# Patient Record
Sex: Male | Born: 1970 | Race: White | Hispanic: No | Marital: Married | Smoking: Never smoker
Health system: Southern US, Community
[De-identification: ages and names within clinical notes are randomized; demographics above are authoritative.]

## PROBLEM LIST (undated history)

## (undated) DIAGNOSIS — T8859XA Other complications of anesthesia, initial encounter: Secondary | ICD-10-CM

## (undated) DIAGNOSIS — G4730 Sleep apnea, unspecified: Secondary | ICD-10-CM

## (undated) DIAGNOSIS — T4145XA Adverse effect of unspecified anesthetic, initial encounter: Secondary | ICD-10-CM

## (undated) DIAGNOSIS — J189 Pneumonia, unspecified organism: Secondary | ICD-10-CM

## (undated) DIAGNOSIS — F909 Attention-deficit hyperactivity disorder, unspecified type: Secondary | ICD-10-CM

## (undated) DIAGNOSIS — E785 Hyperlipidemia, unspecified: Secondary | ICD-10-CM

## (undated) DIAGNOSIS — I10 Essential (primary) hypertension: Secondary | ICD-10-CM

## (undated) DIAGNOSIS — T7840XA Allergy, unspecified, initial encounter: Secondary | ICD-10-CM

## (undated) DIAGNOSIS — J45909 Unspecified asthma, uncomplicated: Secondary | ICD-10-CM

## (undated) DIAGNOSIS — M8700 Idiopathic aseptic necrosis of unspecified bone: Secondary | ICD-10-CM

## (undated) DIAGNOSIS — R7303 Prediabetes: Secondary | ICD-10-CM

## (undated) HISTORY — PX: GANGLION CYST EXCISION: SHX1691

## (undated) HISTORY — PX: TONGUE FLAP RELEASE: SHX2537

---

## 2007-03-07 ENCOUNTER — Emergency Department: Payer: Self-pay | Admitting: Emergency Medicine

## 2008-01-10 ENCOUNTER — Emergency Department: Payer: Self-pay | Admitting: Unknown Physician Specialty

## 2008-01-10 ENCOUNTER — Other Ambulatory Visit: Payer: Self-pay

## 2011-01-30 ENCOUNTER — Ambulatory Visit: Payer: Self-pay | Admitting: Orthopedic Surgery

## 2011-03-10 ENCOUNTER — Ambulatory Visit: Payer: Self-pay | Admitting: Pain Medicine

## 2011-03-21 ENCOUNTER — Ambulatory Visit: Payer: Self-pay | Admitting: Pain Medicine

## 2011-03-29 ENCOUNTER — Ambulatory Visit: Payer: Self-pay | Admitting: Pain Medicine

## 2011-05-03 ENCOUNTER — Ambulatory Visit: Payer: Self-pay | Admitting: Pain Medicine

## 2011-05-24 ENCOUNTER — Ambulatory Visit: Payer: Self-pay | Admitting: Pain Medicine

## 2011-05-26 ENCOUNTER — Ambulatory Visit: Payer: Self-pay | Admitting: Pain Medicine

## 2011-06-13 ENCOUNTER — Ambulatory Visit: Payer: Self-pay | Admitting: Pain Medicine

## 2011-06-16 ENCOUNTER — Ambulatory Visit: Payer: Self-pay | Admitting: Pain Medicine

## 2011-06-29 ENCOUNTER — Ambulatory Visit: Payer: Self-pay | Admitting: General Practice

## 2011-07-31 ENCOUNTER — Ambulatory Visit: Payer: Self-pay | Admitting: Orthopedic Surgery

## 2011-09-27 ENCOUNTER — Ambulatory Visit: Payer: Self-pay | Admitting: Orthopedic Surgery

## 2011-10-05 ENCOUNTER — Other Ambulatory Visit: Payer: Self-pay | Admitting: Orthopedic Surgery

## 2011-10-05 ENCOUNTER — Other Ambulatory Visit: Payer: Self-pay

## 2011-10-05 ENCOUNTER — Encounter (HOSPITAL_COMMUNITY): Payer: Self-pay

## 2011-10-05 ENCOUNTER — Encounter (HOSPITAL_COMMUNITY)
Admission: RE | Admit: 2011-10-05 | Discharge: 2011-10-05 | Disposition: A | Discharge status: Home / Self Care | Payer: Worker's Compensation | Source: Ambulatory Visit | Attending: Orthopedic Surgery | Admitting: Orthopedic Surgery

## 2011-10-05 HISTORY — DX: Pneumonia, unspecified organism: J18.9

## 2011-10-05 LAB — PROTIME-INR: INR: 0.95 (ref 0.00–1.49)

## 2011-10-05 LAB — URINALYSIS, ROUTINE W REFLEX MICROSCOPIC
Glucose, UA: NEGATIVE mg/dL
Ketones, ur: NEGATIVE mg/dL
Leukocytes, UA: NEGATIVE
pH: 6 (ref 5.0–8.0)

## 2011-10-05 LAB — CBC
Hemoglobin: 15.1 g/dL (ref 13.0–17.0)
MCH: 28.9 pg (ref 26.0–34.0)
MCHC: 33.5 g/dL (ref 30.0–36.0)
RDW: 13.5 % (ref 11.5–15.5)

## 2011-10-05 LAB — BASIC METABOLIC PANEL
BUN: 14 mg/dL (ref 6–23)
Calcium: 9.3 mg/dL (ref 8.4–10.5)
Creatinine, Ser: 0.88 mg/dL (ref 0.50–1.35)
GFR calc non Af Amer: >90 mL/min (ref >90)
Glucose, Bld: 130 mg/dL — ABNORMAL HIGH (ref 70–99)

## 2011-10-05 LAB — DIFFERENTIAL
Basophils Absolute: 0 10*3/uL (ref 0.0–0.1)
Basophils Relative: 0 % (ref 0–1)
Eosinophils Absolute: 0.2 10*3/uL (ref 0.0–0.7)
Monocytes Relative: 6 % (ref 3–12)
Neutrophils Relative %: 70 % (ref 43–77)

## 2011-10-05 LAB — ABO/RH: ABO/RH(D): A POS

## 2011-10-05 LAB — SURGICAL PCR SCREEN: MRSA, PCR: NEGATIVE

## 2011-10-05 NOTE — Pre-Procedure Instructions (Signed)
18 [name]  10/05/2011   Your procedure is scheduled on:  Friday October 07, 2011  Report to [facility] at 1:00pm AM.  Call this number if you have problems the morning of surgery: [phone]   Remember:   Do not eat food:After Midnight.  May have clear liquids: up to 4 Hours before arrival. (up to 9:00am)  Clear liquids include soda, tea, black coffee, apple or grape juice, broth.  Take these medicines the morning of surgery with A SIP OF WATER:    Do not wear jewelry, make-up or nail polish.  Do not wear lotions, powders, or perfumes. You may wear deodorant.  Do not shave 48 hours prior to surgery.  Do not bring valuables to the hospital.  Contacts, dentures or bridgework may not be worn into surgery.  Leave suitcase in the car. After surgery it may be brought to your room.  For patients admitted to the hospital, checkout time is 11:00 AM the day of discharge.   Patients discharged the day of surgery will not be allowed to drive home.  Name and phone number of your driver: family/friend  Special Instructions: Incentive Spirometry - Practice and bring it with you on the day of surgery. and CHG Shower Use Special Wash: 1/2 bottle night before surgery and 1/2 bottle morning of surgery.   Please read over the following fact sheets that you were given: Pain Booklet, Coughing and Deep Breathing, Blood Transfusion Information, Total Joint Packet, MRSA Information and Surgical Site Infection Prevention

## 2011-10-05 NOTE — Progress Notes (Signed)
Patient request to be put in privacy status, notified Kim in admission.

## 2011-10-06 MED ORDER — CEFAZOLIN SODIUM-DEXTROSE 2-3 GM-% IV SOLR
2.0000 g | INTRAVENOUS | Status: AC
  Administered 2011-10-07: 2 g via INTRAVENOUS
  Filled 2011-10-06: qty 50

## 2011-10-06 MED ORDER — CHLORHEXIDINE GLUCONATE 4 % EX LIQD: 60.0000 mL | Freq: Once | CUTANEOUS | Status: DC

## 2011-10-06 MED ORDER — CEFAZOLIN SODIUM 1-5 GM-% IV SOLN: 1.0000 g | INTRAVENOUS | Status: DC

## 2011-10-06 NOTE — H&P (Signed)
  HISTORY  OF  PRESENT  ILLNESS:   [name]  is  a  pleasant  41 year old  male  who  states  he  was injured at work on 01/18/2011.  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  He describes the pain in his right groin as a sharp, stabbing, sensation and rates the pain at approximately 8/10.    He did have a total of 1 month of physical therapy with no significant long term improvement.  He has also tried pain medicine and NSAIDs without improvement.  PAST MEDICAL HISTORY:  None. PAST SURGICAL HISTORY:  None. MEDICATIONS:  Tramadol, naproxen. ALLERGIES:  None. FAMILY HISTORY:  None.  SOCIAL  HISTORY:  The patient denies alcohol or tobacco use.  He lives alone.  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  Review  of  systems  reviewed  thoroughly  and  all  other  systems  are  negative  as  related  to  the  chief complaint. No SOB, No chest pain, no ulcer, no diabetes, no seizures.  PHYSICAL  EXAMINATION:  The patient is alert and  oriented x3  and  in  no acute distress.  He does walk with a limp and clearly does favor the right side.  Of particular note, he does have pain with internal rotation of the right hip.  He states that this does reproduce his daily pain with walking.  He also has pain with axial loading of the right hip.  He is neurovascularly intact with no strength deficits.  His reflexes are symmetric at the knees and at the ankles at approximately 2+.  Straight leg raising is negative on the right and  the  left  sides.   The skin  on  the  back  and  bilateral  legs  are  intact.   His  capillary  refill  is  less  than  2 second.   His  extraocular  motions  are  intact.   His  breathing  is  nonlabored.   There  is  no  swelling  in  his bilateral legs.  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    ASSESSMENT:  Right  hip AVN  PLAN:   Based on the findings on the MRI scan and the patient's level of pain and dysfunction, we recommend total hip arthroplasty.  The risks and benefits of surgery were discussed in detail with the patient and his rehab nurse today.  We will schedule surgery pending approval by worker's comp.

## 2011-10-07 ENCOUNTER — Encounter (HOSPITAL_COMMUNITY): Payer: Self-pay | Admitting: Anesthesiology

## 2011-10-07 ENCOUNTER — Inpatient Hospital Stay (HOSPITAL_COMMUNITY)
Admission: RE | Admit: 2011-10-07 | Discharge: 2011-10-08 | DRG: 470 | Disposition: A | Discharge status: Home / Self Care | Payer: Worker's Compensation | Source: Ambulatory Visit | Attending: Orthopedic Surgery | Admitting: Orthopedic Surgery

## 2011-10-07 ENCOUNTER — Inpatient Hospital Stay (HOSPITAL_COMMUNITY): Payer: Worker's Compensation

## 2011-10-07 ENCOUNTER — Inpatient Hospital Stay (HOSPITAL_COMMUNITY): Payer: Worker's Compensation | Admitting: Anesthesiology

## 2011-10-07 ENCOUNTER — Encounter (HOSPITAL_COMMUNITY): Payer: Self-pay | Admitting: *Deleted

## 2011-10-07 ENCOUNTER — Encounter (HOSPITAL_COMMUNITY): Payer: Self-pay | Admitting: Orthopedic Surgery

## 2011-10-07 ENCOUNTER — Encounter (HOSPITAL_COMMUNITY)
Admission: RE | Disposition: A | Discharge status: Home / Self Care | Payer: Self-pay | Source: Ambulatory Visit | Attending: Orthopedic Surgery

## 2011-10-07 DIAGNOSIS — M87059 Idiopathic aseptic necrosis of unspecified femur: Secondary | ICD-10-CM | POA: Diagnosis present

## 2011-10-07 DIAGNOSIS — Z01818 Encounter for other preprocedural examination: Secondary | ICD-10-CM

## 2011-10-07 DIAGNOSIS — Z01810 Encounter for preprocedural cardiovascular examination: Secondary | ICD-10-CM

## 2011-10-07 HISTORY — DX: Idiopathic aseptic necrosis of unspecified bone: M87.00

## 2011-10-07 HISTORY — PX: TOTAL HIP ARTHROPLASTY: SHX124

## 2011-10-07 SURGERY — ARTHROPLASTY, HIP, TOTAL,POSTERIOR APPROACH
Anesthesia: General | Site: Hip | Laterality: Right | Wound class: Clean

## 2011-10-07 MED ORDER — VECURONIUM BROMIDE 10 MG IV SOLR
INTRAVENOUS | Status: DC | PRN
  Administered 2011-10-07: 2 mg via INTRAVENOUS
  Administered 2011-10-07 (×2): 1 mg via INTRAVENOUS

## 2011-10-07 MED ORDER — PROPOFOL 10 MG/ML IV EMUL
INTRAVENOUS | Status: DC | PRN
  Administered 2011-10-07: 200 mg via INTRAVENOUS

## 2011-10-07 MED ORDER — ALUMINUM HYDROXIDE GEL 320 MG/5ML PO SUSP
15.0000 mL | ORAL | Status: DC | PRN
  Filled 2011-10-07: qty 30

## 2011-10-07 MED ORDER — METOCLOPRAMIDE HCL 10 MG PO TABS: 5.0000 mg | ORAL_TABLET | Freq: Three times a day (TID) | ORAL | Status: DC | PRN

## 2011-10-07 MED ORDER — OXYCODONE HCL 5 MG PO TABS
5.0000 mg | ORAL_TABLET | ORAL | Status: DC | PRN
  Administered 2011-10-07 – 2011-10-08 (×3): 10 mg via ORAL
  Filled 2011-10-07 (×4): qty 2

## 2011-10-07 MED ORDER — GLYCOPYRROLATE 0.2 MG/ML IJ SOLN
INTRAMUSCULAR | Status: DC | PRN
  Administered 2011-10-07: 1 mg via INTRAVENOUS

## 2011-10-07 MED ORDER — FLEET ENEMA 7-19 GM/118ML RE ENEM: 1.0000 | ENEMA | Freq: Once | RECTAL | Status: AC | PRN

## 2011-10-07 MED ORDER — ONDANSETRON HCL 4 MG PO TABS: 4.0000 mg | ORAL_TABLET | Freq: Four times a day (QID) | ORAL | Status: DC | PRN

## 2011-10-07 MED ORDER — WARFARIN VIDEO: Freq: Once | Status: DC

## 2011-10-07 MED ORDER — DEXTROSE-NACL 5-0.45 % IV SOLN: INTRAVENOUS | Status: DC

## 2011-10-07 MED ORDER — DIPHENHYDRAMINE HCL 12.5 MG/5ML PO ELIX
12.5000 mg | ORAL_SOLUTION | ORAL | Status: DC | PRN
  Filled 2011-10-07: qty 10

## 2011-10-07 MED ORDER — LACTATED RINGERS IV SOLN
INTRAVENOUS | Status: DC | PRN
  Administered 2011-10-07 (×3): via INTRAVENOUS

## 2011-10-07 MED ORDER — NEOSTIGMINE METHYLSULFATE 1 MG/ML IJ SOLN
INTRAMUSCULAR | Status: DC | PRN
  Administered 2011-10-07: 5 mg via INTRAVENOUS

## 2011-10-07 MED ORDER — ENOXAPARIN SODIUM 40 MG/0.4ML ~~LOC~~ SOLN
40.0000 mg | SUBCUTANEOUS | Status: DC
  Administered 2011-10-08: 40 mg via SUBCUTANEOUS
  Filled 2011-10-07 (×2): qty 0.4

## 2011-10-07 MED ORDER — METHOCARBAMOL 100 MG/ML IJ SOLN
500.0000 mg | Freq: Four times a day (QID) | INTRAVENOUS | Status: DC | PRN
  Administered 2011-10-07: 500 mg via INTRAVENOUS
  Filled 2011-10-07: qty 5

## 2011-10-07 MED ORDER — ROCURONIUM BROMIDE 100 MG/10ML IV SOLN
INTRAVENOUS | Status: DC | PRN
  Administered 2011-10-07: 50 mg via INTRAVENOUS

## 2011-10-07 MED ORDER — PHENOL 1.4 % MT LIQD
1.0000 | OROMUCOSAL | Status: DC | PRN
  Filled 2011-10-07: qty 177

## 2011-10-07 MED ORDER — ACETAMINOPHEN 325 MG PO TABS: 650.0000 mg | ORAL_TABLET | Freq: Four times a day (QID) | ORAL | Status: DC | PRN

## 2011-10-07 MED ORDER — FENTANYL CITRATE 0.05 MG/ML IJ SOLN
INTRAMUSCULAR | Status: DC | PRN
  Administered 2011-10-07 (×2): 50 ug via INTRAVENOUS
  Administered 2011-10-07: 150 ug via INTRAVENOUS

## 2011-10-07 MED ORDER — BISACODYL 5 MG PO TBEC: 5.0000 mg | DELAYED_RELEASE_TABLET | Freq: Every day | ORAL | Status: DC | PRN

## 2011-10-07 MED ORDER — KCL IN DEXTROSE-NACL 20-5-0.45 MEQ/L-%-% IV SOLN
INTRAVENOUS | Status: DC
  Administered 2011-10-07: 1000 mL via INTRAVENOUS
  Filled 2011-10-07 (×4): qty 1000

## 2011-10-07 MED ORDER — HYDROMORPHONE HCL PF 1 MG/ML IJ SOLN
0.2500 mg | INTRAMUSCULAR | Status: DC | PRN
  Administered 2011-10-07 (×4): 0.5 mg via INTRAVENOUS

## 2011-10-07 MED ORDER — SODIUM CHLORIDE 0.9 % IR SOLN
Status: DC | PRN
  Administered 2011-10-07: 1000 mL

## 2011-10-07 MED ORDER — HYDROMORPHONE HCL PF 1 MG/ML IJ SOLN
INTRAMUSCULAR | Status: AC
  Administered 2011-10-07: 1 mg
  Filled 2011-10-07: qty 1

## 2011-10-07 MED ORDER — CEFAZOLIN SODIUM 1-5 GM-% IV SOLN
1.0000 g | Freq: Four times a day (QID) | INTRAVENOUS | Status: AC
  Administered 2011-10-07 – 2011-10-08 (×3): 1 g via INTRAVENOUS
  Filled 2011-10-07 (×3): qty 50

## 2011-10-07 MED ORDER — HYDROMORPHONE HCL PF 1 MG/ML IJ SOLN
INTRAMUSCULAR | Status: AC
  Administered 2011-10-07: 1 mg via INTRAVENOUS
  Filled 2011-10-07: qty 1

## 2011-10-07 MED ORDER — ZOLPIDEM TARTRATE 5 MG PO TABS: 5.0000 mg | ORAL_TABLET | Freq: Every evening | ORAL | Status: DC | PRN

## 2011-10-07 MED ORDER — DROPERIDOL 2.5 MG/ML IJ SOLN
INTRAMUSCULAR | Status: AC
  Filled 2011-10-07: qty 2

## 2011-10-07 MED ORDER — COUMADIN BOOK
Freq: Once | Status: AC
  Administered 2011-10-08: 12:00:00
  Filled 2011-10-07: qty 1

## 2011-10-07 MED ORDER — METHOCARBAMOL 500 MG PO TABS
500.0000 mg | ORAL_TABLET | Freq: Four times a day (QID) | ORAL | Status: DC | PRN
  Administered 2011-10-07 – 2011-10-08 (×2): 500 mg via ORAL
  Filled 2011-10-07 (×3): qty 1

## 2011-10-07 MED ORDER — HYDROMORPHONE HCL PF 1 MG/ML IJ SOLN
0.5000 mg | INTRAMUSCULAR | Status: DC | PRN
  Administered 2011-10-07 – 2011-10-08 (×4): 1 mg via INTRAVENOUS
  Filled 2011-10-07 (×3): qty 1

## 2011-10-07 MED ORDER — HYDROCODONE-ACETAMINOPHEN 5-325 MG PO TABS
1.0000 | ORAL_TABLET | ORAL | Status: DC | PRN
  Administered 2011-10-08: 1 via ORAL
  Filled 2011-10-07: qty 1

## 2011-10-07 MED ORDER — DROPERIDOL 2.5 MG/ML IJ SOLN
0.6250 mg | INTRAMUSCULAR | Status: DC | PRN
  Administered 2011-10-07: 0.625 mg via INTRAVENOUS

## 2011-10-07 MED ORDER — ONDANSETRON HCL 4 MG/2ML IJ SOLN
INTRAMUSCULAR | Status: DC | PRN
  Administered 2011-10-07: 4 mg via INTRAVENOUS

## 2011-10-07 MED ORDER — ONDANSETRON HCL 4 MG/2ML IJ SOLN
4.0000 mg | Freq: Four times a day (QID) | INTRAMUSCULAR | Status: DC | PRN
  Administered 2011-10-07 – 2011-10-08 (×3): 4 mg via INTRAVENOUS
  Filled 2011-10-07 (×3): qty 2

## 2011-10-07 MED ORDER — MENTHOL 3 MG MT LOZG: 1.0000 | LOZENGE | OROMUCOSAL | Status: DC | PRN

## 2011-10-07 MED ORDER — SENNOSIDES-DOCUSATE SODIUM 8.6-50 MG PO TABS: 1.0000 | ORAL_TABLET | Freq: Every evening | ORAL | Status: DC | PRN

## 2011-10-07 MED ORDER — BUPIVACAINE-EPINEPHRINE 0.5% -1:200000 IJ SOLN
INTRAMUSCULAR | Status: DC | PRN
  Administered 2011-10-07: 20 mL

## 2011-10-07 MED ORDER — LIDOCAINE HCL (CARDIAC) 20 MG/ML IV SOLN
INTRAVENOUS | Status: DC | PRN
  Administered 2011-10-07: 50 mg via INTRAVENOUS

## 2011-10-07 MED ORDER — MIDAZOLAM HCL 5 MG/5ML IJ SOLN
INTRAMUSCULAR | Status: DC | PRN
  Administered 2011-10-07: 2 mg via INTRAVENOUS

## 2011-10-07 MED ORDER — ACETAMINOPHEN 650 MG RE SUPP: 650.0000 mg | Freq: Four times a day (QID) | RECTAL | Status: DC | PRN

## 2011-10-07 MED ORDER — KCL IN DEXTROSE-NACL 20-5-0.45 MEQ/L-%-% IV SOLN
INTRAVENOUS | Status: AC
  Administered 2011-10-07: 1000 mL via INTRAVENOUS
  Filled 2011-10-07: qty 1000

## 2011-10-07 MED ORDER — WARFARIN SODIUM 10 MG PO TABS
10.0000 mg | ORAL_TABLET | Freq: Once | ORAL | Status: AC
  Administered 2011-10-07: 10 mg via ORAL
  Filled 2011-10-07: qty 1

## 2011-10-07 MED ORDER — LACTATED RINGERS IV SOLN
INTRAVENOUS | Status: DC
  Administered 2011-10-07: 10:00:00 via INTRAVENOUS

## 2011-10-07 MED ORDER — EPHEDRINE SULFATE 50 MG/ML IJ SOLN
INTRAMUSCULAR | Status: DC | PRN
Start: 2011-10-07 — End: 2011-10-07
  Administered 2011-10-07: 15 mg via INTRAVENOUS
  Administered 2011-10-07: 10 mg via INTRAVENOUS

## 2011-10-07 MED ORDER — METOCLOPRAMIDE HCL 5 MG/ML IJ SOLN
5.0000 mg | Freq: Three times a day (TID) | INTRAMUSCULAR | Status: DC | PRN
  Administered 2011-10-07 – 2011-10-08 (×2): 10 mg via INTRAVENOUS
  Filled 2011-10-07 (×2): qty 2

## 2011-10-07 SURGICAL SUPPLY — 55 items
BLADE SAW SAG 73X25 THK (BLADE) ×1
BLADE SAW SGTL 18X1.27X75 (BLADE) IMPLANT
BLADE SAW SGTL 73X25 THK (BLADE) ×1 IMPLANT
BLADE SAW SGTL MED 73X18.5 STR (BLADE) IMPLANT
BRUSH FEMORAL CANAL (MISCELLANEOUS) IMPLANT
CLOTH BEACON ORANGE TIMEOUT ST (SAFETY) ×2 IMPLANT
COVER BACK TABLE 24X17X13 BIG (DRAPES) ×2 IMPLANT
COVER SURGICAL LIGHT HANDLE (MISCELLANEOUS) ×4 IMPLANT
DRAPE ORTHO SPLIT 77X108 STRL (DRAPES) ×1
DRAPE PROXIMA HALF (DRAPES) ×4 IMPLANT
DRAPE SURG ORHT 6 SPLT 77X108 (DRAPES) ×1 IMPLANT
DRAPE U-SHAPE 47X51 STRL (DRAPES) ×2 IMPLANT
DRILL BIT 7/64X5 (BIT) ×2 IMPLANT
DRSG MEPILEX BORDER 4X12 (GAUZE/BANDAGES/DRESSINGS) ×2 IMPLANT
DRSG MEPILEX BORDER 4X8 (GAUZE/BANDAGES/DRESSINGS) IMPLANT
DURAPREP 26ML APPLICATOR (WOUND CARE) ×2 IMPLANT
ELECT BLADE 4.0 EZ CLEAN MEGAD (MISCELLANEOUS) ×2
ELECT REM PT RETURN 9FT ADLT (ELECTROSURGICAL) ×2
ELECTRODE BLDE 4.0 EZ CLN MEGD (MISCELLANEOUS) ×1 IMPLANT
ELECTRODE REM PT RTRN 9FT ADLT (ELECTROSURGICAL) ×1 IMPLANT
FLOSEAL 10ML (HEMOSTASIS) IMPLANT
GAUZE XEROFORM 1X8 LF (GAUZE/BANDAGES/DRESSINGS) ×2 IMPLANT
GLOVE BIO SURGEON STRL SZ7 (GLOVE) ×2 IMPLANT
GLOVE BIO SURGEON STRL SZ7.5 (GLOVE) ×2 IMPLANT
GLOVE BIOGEL PI IND STRL 7.0 (GLOVE) ×1 IMPLANT
GLOVE BIOGEL PI IND STRL 8 (GLOVE) ×1 IMPLANT
GLOVE BIOGEL PI INDICATOR 7.0 (GLOVE) ×1
GLOVE BIOGEL PI INDICATOR 8 (GLOVE) ×1
GOWN PREVENTION PLUS XLARGE (GOWN DISPOSABLE) ×6 IMPLANT
GOWN STRL NON-REIN LRG LVL3 (GOWN DISPOSABLE) ×4 IMPLANT
HANDPIECE INTERPULSE COAX TIP (DISPOSABLE)
HOOD PEEL AWAY FACE SHEILD DIS (HOOD) ×4 IMPLANT
KIT BASIN OR (CUSTOM PROCEDURE TRAY) ×2 IMPLANT
KIT ROOM TURNOVER OR (KITS) ×2 IMPLANT
MANIFOLD NEPTUNE II (INSTRUMENTS) ×2 IMPLANT
NEEDLE 22X1 1/2 (OR ONLY) (NEEDLE) ×2 IMPLANT
NS IRRIG 1000ML POUR BTL (IV SOLUTION) ×2 IMPLANT
PACK TOTAL JOINT (CUSTOM PROCEDURE TRAY) ×2 IMPLANT
PAD ARMBOARD 7.5X6 YLW CONV (MISCELLANEOUS) ×4 IMPLANT
PASSER SUT SWANSON 36MM LOOP (INSTRUMENTS) ×2 IMPLANT
PRESSURIZER FEMORAL UNIV (MISCELLANEOUS) IMPLANT
SET HNDPC FAN SPRY TIP SCT (DISPOSABLE) IMPLANT
SPONGE LAP 18X18 X RAY DECT (DISPOSABLE) ×2 IMPLANT
SUT ETHIBOND 2 V 37 (SUTURE) ×2 IMPLANT
SUT ETHILON 3 0 FSL (SUTURE) ×2 IMPLANT
SUT VIC AB 0 CTB1 27 (SUTURE) ×2 IMPLANT
SUT VIC AB 1 CTX 36 (SUTURE) ×1
SUT VIC AB 1 CTX36XBRD ANBCTR (SUTURE) ×1 IMPLANT
SUT VIC AB 2-0 CTB1 (SUTURE) ×2 IMPLANT
SYR CONTROL 10ML LL (SYRINGE) ×2 IMPLANT
TOWEL OR 17X24 6PK STRL BLUE (TOWEL DISPOSABLE) ×2 IMPLANT
TOWEL OR 17X26 10 PK STRL BLUE (TOWEL DISPOSABLE) ×2 IMPLANT
TOWER CARTRIDGE SMART MIX (DISPOSABLE) IMPLANT
TRAY FOLEY CATH 14FR (SET/KITS/TRAYS/PACK) ×2 IMPLANT
WATER STERILE IRR 1000ML POUR (IV SOLUTION) ×2 IMPLANT

## 2011-10-07 NOTE — Anesthesia Procedure Notes (Signed)
Procedure Name: Intubation Date/Time: 10/07/2011 10:56 AM Performed by: Margaree Mackintosh Pre-anesthesia Checklist: Patient identified, Timeout performed, Emergency Drugs available, Suction available and Patient being monitored Patient Re-evaluated:Patient Re-evaluated prior to inductionOxygen Delivery Method: Circle System Utilized Preoxygenation: Pre-oxygenation with 100% oxygen Intubation Type: IV induction Ventilation: Mask ventilation without difficulty and Oral airway inserted - appropriate to patient size Laryngoscope Size: Mac and 3 Grade View: Grade III Tube type: Oral Tube size: 8.0 mm Number of attempts: 2 Airway Equipment and Method: stylet and video-laryngoscopy Placement Confirmation: breath sounds checked- equal and bilateral and positive ETCO2 Secured at: 23 cm Tube secured with: Tape Difficulty Due To: Difficulty was anticipated and Difficult Airway- due to limited oral opening

## 2011-10-07 NOTE — Progress Notes (Signed)
ANTICOAGULATION CONSULT NOTE - Initial Consult  Pharmacy Consult for Coumadin Indication: VTE prophylaxis  No Known Allergies  Patient Measurements: Height: 6\' 2"  (188 cm) Weight: 240 lb (108.863 kg) IBW/kg (Calculated) : 82.2   Vital Signs: Temp: 97.2 F (36.2 C) (01/18 1504) Temp src: Oral (01/18 0914) BP: 126/84 mmHg (01/18 1504) Pulse Rate: 114  (01/18 1504)  Labs:  [facility] 10/05/11 1217  HGB 15.1  HCT 45.1  PLT 318  APTT 30  LABPROT 12.9  INR 0.95  HEPARINUNFRC --  CREATININE 0.88  CKTOTAL --  CKMB --  TROPONINI --   Estimated Creatinine Clearance: 143.7 ml/min (by C-G formula based on Cr of 0.88).  Medical History: Past Medical History  Diagnosis Date  . Pneumonia   . AVN (avascular necrosis of bone)     r femural head    Medications:  Scheduled:    .  ceFAZolin (ANCEF) IV  1 g Intravenous Q6H  .  ceFAZolin (ANCEF) IV  2 g Intravenous 60 min Pre-Op  . dextrose 5 % and 0.45 % NaCl with KCl 20 mEq/L      . droperidol      . enoxaparin  40 mg Subcutaneous Q24H  . HYDROmorphone      . DISCONTD: chlorhexidine  60 mL Topical Once    Assessment: Pt s/p rt THA to start warfarin.  No history of bleeding noted.  Coumadin score=8.    Goal of Therapy:  INR 2-3   Plan:  1) Coumadin 10mg  po x 1 tonight 2) Bridge with Lovenox 40mg  sq daily until INR >/= 1.8 3) Daily PT/INR 4) Initiate warfarin education  Elson Clan 10/07/2011,4:32 PM

## 2011-10-07 NOTE — Transfer of Care (Signed)
Immediate Anesthesia Transfer of Care Note  Patient: [name]  Procedure(s) Performed:  TOTAL HIP ARTHROPLASTY  Patient Location: PACU  Anesthesia Type: General  Level of Consciousness: awake  Airway & Oxygen Therapy: Patient Spontanous Breathing and Patient connected to nasal cannula oxygen  Post-op Assessment: Report given to PACU RN and Post -op Vital signs reviewed and stable  Post vital signs: Reviewed Filed Vitals:   10/07/11 0914  BP: 125/83  Pulse: 81  Temp: 36.7 C  Resp: 18    Complications: No apparent anesthesia complications

## 2011-10-07 NOTE — Preoperative (Signed)
Beta Blockers   Reason not to administer Beta Blockers:Not Applicable. No home beta blockers 

## 2011-10-07 NOTE — Anesthesia Postprocedure Evaluation (Signed)
Anesthesia Post Note  Patient: [name]  Procedure(s) Performed:  TOTAL HIP ARTHROPLASTY  Anesthesia type: General  Patient location: PACU  Post pain: Pain level controlled and Adequate analgesia  Post assessment: Post-op Vital signs reviewed, Patient's Cardiovascular Status Stable, Respiratory Function Stable, Patent Airway and Pain level controlled  Last Vitals:  Filed Vitals:   10/07/11 0914  BP: 125/83  Pulse: 81  Temp: 36.7 C  Resp: 18    Post vital signs: Reviewed and stable  Level of consciousness: awake, alert  and oriented  Complications: No apparent anesthesia complications

## 2011-10-07 NOTE — Anesthesia Preprocedure Evaluation (Addendum)
Anesthesia Evaluation  Patient identified by MRN, date of birth, ID band Patient awake    Reviewed: Allergy & Precautions, H&P , NPO status , Patient's Chart, lab work & pertinent test results, reviewed documented beta blocker date and time   History of Anesthesia Complications Negative for: history of anesthetic complications  Airway Mallampati: III  Neck ROM: Full  Mouth opening: Limited Mouth Opening  Dental  (+) Teeth Intact and Dental Advisory Given   Pulmonary neg pulmonary ROS, pneumonia ,  clear to auscultation  Pulmonary exam normal       Cardiovascular neg cardio ROS Regular Normal    Neuro/Psych Negative Neurological ROS     GI/Hepatic negative GI ROS, Neg liver ROS,   Endo/Other  Negative Endocrine ROS  Renal/GU negative Renal ROS     Musculoskeletal   Abdominal   Peds  Hematology negative hematology ROS (+)   Anesthesia Other Findings   Reproductive/Obstetrics                          Anesthesia Physical Anesthesia Plan  ASA: II  Anesthesia Plan: General   Post-op Pain Management:    Induction: Intravenous  Airway Management Planned: Oral ETT  Additional Equipment:   Intra-op Plan:   Post-operative Plan: Extubation in OR  Informed Consent: I have reviewed the patients History and Physical, chart, labs and discussed the procedure including the risks, benefits and alternatives for the proposed anesthesia with the patient or authorized representative who has indicated his/her understanding and acceptance.     Plan Discussed with: CRNA, Anesthesiologist and Surgeon  Anesthesia Plan Comments:        Anesthesia Quick Evaluation

## 2011-10-07 NOTE — Interval H&P Note (Signed)
History and Physical Interval Note:  10/07/2011 10:44 AM  [name]  has presented today for surgery, with the diagnosis of AVN Right Hip. He has femoral head collapse with severe unremitting pain.  The various methods of treatment have been discussed with the patient and family. After consideration of risks, benefits and other options for treatment, the patient has consented to  Procedure(s): TOTAL HIP ARTHROPLASTY as a surgical intervention .  The patients' history has been reviewed, patient examined, no change in status, stable for surgery.  I have reviewed the patients' chart and labs.  Questions were answered to the patient's satisfaction.     Nestor Lewandowsky

## 2011-10-07 NOTE — Op Note (Addendum)
OPERATIVE REPORT    DATE OF PROCEDURE:  10/07/2011       PREOPERATIVE DIAGNOSIS:  AVN Right Hip                                                       Estimated Body mass index is 30.81 kg/(m^2) as calculated from the following:   Height as of this encounter: 6\' 2" (1.88 m).   Weight as of this encounter: 240 lb(108.863 kg).     POSTOPERATIVE DIAGNOSIS:  AVN Right Hip                                                           PROCEDURE:  R total hip arthroplasty using a 56 mm DePuy Pinnacle  Cup, [employer], 10-degree polyethylene liner index superior  and posterior, a +0 36 mm ceramic head, a #[id number] SROM stem, 20Fsm Cone   SURGEON: Zierra Laroque J    ASSISTANT:   Danielle Laliberte PA-S   ANESTHESIA:  General  BLOOD LOSS: * No blood loss amount entered *  FLUID REPLACEMENT: 1800 crystalloid DRAINS: Foley Catheter URINE OUTPUT: 400 COMPLICATIONS:  None    INDICATIONS FOR PROCEDURE: A 41 y.o. year-old male  With  AVN Right Hip   for 1 years, x-rays show bone-on-bone arthritic changes. Despite conservative measures with observation, anti-inflammatory medicine, narcotics, use of a cane, has severe unremitting pain and can ambulate only a few blocks before resting.  Patient desires elective R total hip arthroplasty to decrease pain and increase function. The risks, benefits, and alternatives were discussed at length including but not limited to the risks of infection, bleeding, nerve injury, stiffness, blood clots, the need for revision surgery, cardiopulmonary complications, among others, and they were willing to proceed.y have been discussed. Questions answered.     PROCEDURE IN DETAIL: The patient was identified by armband,  received preoperative IV antibiotics in the holding area at [facility], taken to the operating room , appropriate anesthetic monitors  were attached and general endotracheal anesthesia induced. Foley catheter was inserted. He was rolled into  the L lateral decubitus position and fixed there with a Stulberg Mark II pelvic clamp and the R lower extremity was then prepped and draped  in the usual sterile fashion from the ankle to the hemipelvis. A time-out  procedure was performed. The skin along the lateral hip and thigh  infiltrated with 10 mL of 0.5% Marcaine and epinephrine solution. We  then made a posterolateral approach to the hip. With a #10 blade, 18 cm  incision through skin and subcutaneous tissue down to the level of the  IT band. Small bleeders were identified and cauterized. IT band cut in  line with skin incision exposing the greater trochanter. A Cobra retractor was placed between the gluteus minimus and the superior hip joint capsule, and a spiked Cobra between the quadratus femoris and the inferior hip joint capsule. This isolated the short  external rotators and piriformis tendons. These were tagged with a #2 Ethibond  suture and cut off their insertion on the intertrochanteric crest. The posterior  capsule was then developed into an acetabular-based flap  from Posterior Superior off of the acetabulum out over the femoral neck and back posterior inferior to the acetabular rim. This flap was tagged with two #2 Ethibond sutures and retracted protecting the sciatic nerve. This exposed the arthritic femoral head and osteophytes. The hip was then flexed and internally rotated, dislocating the femoral head and a standard neck cut performed 1 fingerbreadth above the lesser trochanter.  A spiked Cobra was placed in the cotyloid notch and a Hohmann retractor was then used to lever the femur anteriorly off of the anterior pelvic column. A posterior-inferior wing retractor was placed at the junction of the acetabulum and the ischium completing the acetabular exposure.We then removed the peripheral osteophytes and labrum from the acetabulum. We then reamed the acetabulum up to 55 mm with basket reamers obtaining good coverage in all  quadrants, irrigated out with normal  saline solution and hammered into place a 56 mm pinnacle cup in 45  degrees of abduction and about 20 degrees of anteversion. More  peripheral osteophytes removed and a trial 10-degree liner placed with the  index superior-posterior. The hip was then flexed and internally rotated exposing the  proximal femur, which was entered with the initiating reamer followed by  the axial reamers up to a 15.5 mm full depth and 16mm partial depth. We then conically reamed to 56F to the correct depth for a 42 base neck. The calcar was milled to 56Fsm. A trial cone and stem was inserted in the 25 degrees anteversion, with a +0 36mm trial head. Trial reduction was then performed and excellent stability was noted with at 90 of flexion with 75deg of internal rotation and then full extension with maximal external rotation. The hip could not be dislocated in full extension. The knee could easily flex  to about 130 degrees. We also stretched the abductors at this point,  because of the preexisting adductor contractures. All trial components  were then removed. The acetabulum was irrigated out with normal saline  solution. A titanium [facility] was then screwed into place  followed by a 10-degree polyethylene liner index superior-posterior. On  the femoral side a 56Fsm ZTT1 cone was hammered into place, followed by a [id number] SROM stem in 25 degrees of anteversion. At this point, a +0 36 mm ceramic head was  hammered on the stem. The hip was reduced. We checked our stability  one more time and found to be excellent. The wound was once again  thoroughly irrigated out with normal saline solution pulse lavage. The  capsular flap and short external rotators were repaired back to the  intertrochanteric crest through drill holes with a #2 Ethibond suture.  The IT band was closed with running 1 Vicryl suture. The subcutaneous  tissue with 0 and 2-0 undyed Vicryl suture and  the skin with running  interlocking 3-0 nylon suture. Dressing of Xeroform and Mepilex was  then applied. The patient was then unclamped, rolled supine, awaken extubated and taken to recovery room without difficulty in stable condition.   Nestor Lewandowsky 10/07/2011, 12:42 PM

## 2011-10-08 LAB — PROTIME-INR: INR: 1.09 (ref 0.00–1.49)

## 2011-10-08 LAB — CBC
HCT: 40.3 % (ref 39.0–52.0)
MCHC: 33.7 g/dL (ref 30.0–36.0)
MCV: 86.1 fL (ref 78.0–100.0)
Platelets: 317 10*3/uL (ref 150–400)
RDW: 13.8 % (ref 11.5–15.5)
WBC: 10.7 10*3/uL — ABNORMAL HIGH (ref 4.0–10.5)

## 2011-10-08 LAB — BASIC METABOLIC PANEL
BUN: 9 mg/dL (ref 6–23)
Chloride: 100 mEq/L (ref 96–112)
Creatinine, Ser: 0.84 mg/dL (ref 0.50–1.35)
GFR calc Af Amer: >90 mL/min (ref >90)
GFR calc non Af Amer: >90 mL/min (ref >90)
Potassium: 3.8 mEq/L (ref 3.5–5.1)

## 2011-10-08 MED ORDER — WARFARIN SODIUM 5 MG PO TABS: 5.0000 mg | ORAL_TABLET | Freq: Every day | ORAL | Status: DC

## 2011-10-08 MED ORDER — OXYCODONE-ACETAMINOPHEN 5-325 MG PO TABS: 1.0000 | ORAL_TABLET | ORAL | Status: AC | PRN

## 2011-10-08 NOTE — Progress Notes (Signed)
Physical Therapy Evaluation Patient Details Name: [name] MRN: [id number] DOB: 1971-04-22 Today's Date: 10/08/2011  Problem List:  Patient Active Problem List  Diagnoses  . 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 Pneumonia   . AVN (avascular necrosis of bone)     r femural head   Past Surgical History:  Past Surgical History  Procedure Date  . Ganglion cyst excision   . Tongue flap release     PT Assessment/Plan/Recommendation PT Assessment Clinical Impression Statement: Pt presents with a medical diagnosis of Right THA along with the following impairments/deficits and therapy diagnosis listed below. Pt is at a supervision level for all mobility and has completed stairs for a safe d/c home with min assist from a friend.  PT Recommendation/Assessment: Patient will need skilled PT in the acute care venue PT Problem List: Decreased strength;Decreased range of motion;Decreased activity tolerance;Decreased mobility;Decreased knowledge of use of DME;Decreased knowledge of precautions;Pain PT Therapy Diagnosis : Abnormality of gait;Acute pain PT Plan PT Frequency: 7X/week PT Treatment/Interventions: DME instruction;Gait training;Functional mobility training;Stair training;Therapeutic activities;Therapeutic exercise;Patient/family education PT Recommendation Follow Up Recommendations: Home health PT;Supervision - Intermittent Equipment Recommended: None recommended by PT PT Goals  Acute Rehab PT Goals PT Goal Formulation: With patient Time For Goal Achievement: 7 days Pt will go Supine/Side to Sit: with modified independence PT Goal: Supine/Side to Sit - Progress: Goal set today Pt will go Sit to Supine/Side: with modified independence PT Goal: Sit to Supine/Side - Progress: Goal set today Pt will go Sit to Stand: with modified independence PT Goal: Sit to Stand - Progress: Goal set today Pt will go  Stand to Sit: with modified independence PT Goal: Stand to Sit - Progress: Goal set today Pt will Transfer Bed to Chair/Chair to Bed: with modified independence PT Transfer Goal: Bed to Chair/Chair to Bed - Progress: Goal set today Pt will Ambulate: >150 feet;with modified independence;with rolling walker PT Goal: Ambulate - Progress: Goal set today Pt will Go Up / Down Stairs: 3-5 stairs;with min assist;with rolling walker PT Goal: Up/Down Stairs - Progress: Goal set today Pt will Perform Home Exercise Program: Independently PT Goal: Perform Home Exercise Program - Progress: Goal set today  PT Evaluation Precautions/Restrictions  Precautions Precautions: Posterior Hip Restrictions Weight Bearing Restrictions: Yes RLE Weight Bearing: Weight bearing as tolerated Prior Functioning  Home Living Lives With: Alone Receives Help From: Friend(s) Type of Home: House Home Layout: One level Home Access: Stairs to enter Entrance Stairs-Rails: None Secretary/administrator of Steps: 3 Bathroom Shower/Tub: Health visitor: Standard Bathroom Accessibility: Yes How Accessible: Accessible via walker Home Adaptive Equipment: Bedside commode/3-in-1;Straight cane;Walker - rolling;Grab bars in shower Prior Function Level of Independence: Independent with basic ADLs;Independent with homemaking with ambulation;Independent with transfers;Independent with gait Able to Take Stairs?: Yes Driving: Yes Vocation: Full time employment Cognition Cognition Arousal/Alertness: Awake/alert Overall Cognitive Status: Appears within functional limits for tasks assessed Orientation Level: Oriented X4 Sensation/Coordination Sensation Light Touch: Appears Intact Extremity Assessment RLE Assessment RLE Assessment: Exceptions to [facility] RLE AROM (degrees) Overall AROM Right Lower Extremity: Deficits;Due to pain;Due to precautions RLE Overall AROM Comments: Knee and Ankle WFL; Hip limited by pain RLE  Strength RLE Overall Strength: Deficits;Due to pain;Due to precautions RLE Overall Strength Comments: Knee and Ankle WFL; UTA hip secondary to pain LLE Assessment LLE Assessment: Within Functional Limits Mobility (including Balance) Bed Mobility Bed Mobility: Yes Supine to Sit: 4:  Min assist;HOB elevated (Comment degrees);With rails (20) Supine to Sit Details (indicate cue type and reason): VC for sequencing to maintain hip precautions during transfer. Assist with RLE Sitting - Scoot to Edge of Bed: 5: Supervision Sitting - Scoot to Edge of Bed Details (indicate cue type and reason): VC for weight shifting and hand placement Transfers Transfers: Yes Sit to Stand: 5: Supervision;With upper extremity assist;From bed Sit to Stand Details (indicate cue type and reason): VC for hand placement for safety and technique to maintain hip precaution with LE placement Stand to Sit: 5: Supervision;With upper extremity assist;To chair/3-in-1 Stand to Sit Details: VC for hand placement and sequencing to maintain hip precautions Ambulation/Gait Ambulation/Gait: Yes Ambulation/Gait Assistance: 5: Supervision Ambulation/Gait Assistance Details (indicate cue type and reason): VC for sequencing and maintaining hip precautions with turning. Supervision for safety Ambulation Distance (Feet): 200 Feet Assistive device: Rolling walker Gait Pattern: Step-to pattern;Decreased hip/knee flexion - right;Decreased step length - left;Decreased stance time - right Gait velocity: Normal gait speed Stairs: Yes Stairs Assistance: 4: Min assist Stairs Assistance Details (indicate cue type and reason): Min assist for stability of RW. VC for sequencing and technique. Stair Management Technique: Backwards;No rails;With walker Number of Stairs: 2     Exercise  Total Joint Exercises Quad Sets: AROM;Strengthening;Right;10 reps;Supine Heel Slides: AROM;Strengthening;Right;10 reps;Supine Straight Leg Raises:  AROM;Strengthening;Right;10 reps;Supine End of Session PT - End of Session Equipment Utilized During Treatment: Gait belt Activity Tolerance: Patient tolerated treatment well Patient left: in chair;with call bell in reach Nurse Communication: Mobility status for transfers;Mobility status for ambulation General Behavior During Session: [facility] for tasks performed Cognition: [facility] for tasks performed  Milana Kidney 10/08/2011, 10:42 AM  10/08/2011 Milana Kidney DPT PAGER: [phone] OFFICE: [phone]

## 2011-10-08 NOTE — Progress Notes (Signed)
PATIENT ID: [name]  MRN: [id number]  DOB/AGE:  1971-04-04 / 41 y.o.  1 Day Post-Op Procedure(s) (LRB): TOTAL HIP ARTHROPLASTY (Right)  Subjective: Pain is mild.  No c/o chest pain or SOB.   Comfortable, eager to get moving.   Objective: Vital signs in last 24 hours: Temp:  [97 F (36.1 C)-98 F (36.7 C)] 97.7 F (36.5 C) (01/19 0547) Pulse Rate:  [75-114] 93  (01/19 0547) Resp:  [16-23] 18  (01/19 0547) BP: (125-141)/(63-84) 130/72 mmHg (01/19 0547) SpO2:  [93 %-100 %] 99 % (01/19 0547) Weight:  [213.086 kg (240 lb)] 108.863 kg (240 lb) (01/18 0932)  Intake/Output from previous day: 01/18 0701 - 01/19 0700 In: 3445 [P.O.:480; I.V.:2965] Out: 1975 [Urine:1675; Blood:300] Intake/Output this shift:     Basename 10/08/11 0645 10/05/11 1217  HGB 13.6 15.1    Basename 10/08/11 0645 10/05/11 1217  WBC 10.7* 7.6  RBC 4.68 5.22  HCT 40.3 45.1  PLT 317 318    Basename 10/08/11 0645 10/05/11 1217  NA 138 138  K 3.8 3.7  CL 100 102  CO2 28 26  BUN 9 14  CREATININE 0.84 0.88  GLUCOSE 118* 130*  CALCIUM 8.7 9.3    Basename 10/08/11 0645 10/05/11 1217  LABPT -- --  INR 1.09 0.95    Physical Exam: Neurovascular intact Sensation intact distally Intact pulses distally Dorsiflexion/Plantar flexion intact Incision: scant drainage  Assessment/Plan: 1 Day Post-Op Procedure(s) (LRB): TOTAL HIP ARTHROPLASTY (Right)   Advance diet Up with therapy D/C IV fluids Weight Bearing as Tolerated (WBAT)  VTE prophylaxis: pharmacologic prophylaxis (with any of the following: warfarin adjusted-dose) Likely d/c tomorrow, but if able to mobilize well today with therapy and would like to he could possibly go today.    Mable Paris 10/08/2011, 8:42 AM

## 2011-10-08 NOTE — Progress Notes (Signed)
Occupational Therapy Evaluation Patient Details Name: [name] MRN: [id number] DOB: 1970/10/16 Today's Date: 10/08/2011  Problem List:  Patient Active Problem List  Diagnoses  . 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 Pneumonia   . AVN (avascular necrosis of bone)     r femural head   Past Surgical History:  Past Surgical History  Procedure Date  . Ganglion cyst excision   . Tongue flap release     OT Assessment/Plan/Recommendation OT Assessment Clinical Impression Statement: Pt 41 yo s/p R THR posterior. Educated pt on all AE and AMD needed for ADL/adhering to THP. Pt return demonstrated. No further OT needs OT Recommendation/Assessment: Patient does not need any further OT services OT Recommendation Follow Up Recommendations: No OT follow up Equipment Recommended:  (hip kit) OT Goals Acute Rehab OT Goals OT Goal Formulation:  (eval only)  OT Evaluation Precautions/Restrictions  Precautions Precautions: Posterior Hip Required Braces or Orthoses: No Restrictions Weight Bearing Restrictions: Yes RLE Weight Bearing: Weight bearing as tolerated Prior Functioning Home Living Lives With: Alone Receives Help From: Friend(s) Type of Home: House Home Layout: One level Home Access: Stairs to enter Entrance Stairs-Rails: None Secretary/administrator of Steps: 3 Bathroom Shower/Tub: Health visitor: Standard Bathroom Accessibility: Yes How Accessible: Accessible via walker Home Adaptive Equipment: Bedside commode/3-in-1;Straight cane;Walker - rolling;Grab bars in shower Prior Function Level of Independence: Independent with basic ADLs;Independent with homemaking with ambulation;Independent with transfers;Independent with gait Able to Take Stairs?: Yes Driving: Yes Vocation: Full time employment ADL ADL Eating/Feeding: Independent;Simulated Where Assessed - Eating/Feeding:  Chair Grooming: Performed;Independent Where Assessed - Grooming: Standing at sink Upper Body Bathing: Simulated;Independent Where Assessed - Upper Body Bathing: Sit to stand from chair Lower Body Bathing: Simulated;Supervision/safety Where Assessed - Lower Body Bathing: Sit to stand from chair Upper Body Dressing: Independent;Performed Where Assessed - Upper Body Dressing: Sitting, chair Lower Body Dressing: Simulated;Supervision/safety Where Assessed - Lower Body Dressing: Sit to stand from chair Toilet Transfer: Modified independent Toilet Transfer Method: Proofreader: Bedside commode Toileting - Clothing Manipulation: Independent Where Assessed - Toileting Clothing Manipulation: Standing Toileting - Hygiene: Independent;Performed Where Assessed - Toileting Hygiene: Standing;Sit to stand from 3-in-1 or toilet Tub/Shower Transfer: Simulated;Supervision/safety Tub/Shower Transfer Method: Ambulating;Other (comment) (back in ) Equipment Used: Reacher;Long-handled sponge;Rolling walker;Sock aid Ambulation Related to ADLs: distant supervision ADL Comments: indep with UB ADL. S with LB ADL with AE Vision/Perception  Vision - History Baseline Vision: No visual deficits Perception Perception: Within Functional Limits Praxis Praxis: Intact Cognition Cognition Arousal/Alertness: Awake/alert Overall Cognitive Status: Appears within functional limits for tasks assessed Orientation Level: Oriented X4 Sensation/Coordination Sensation Light Touch: Appears Intact Coordination Gross Motor Movements are Fluid and Coordinated: Yes Fine Motor Movements are Fluid and Coordinated: Yes Extremity Assessment RUE Assessment RUE Assessment: Within Functional Limits LUE Assessment LUE Assessment: Within Functional Limits Mobility  Bed Mobility Bed Mobility: No Transfers Transfers: Yes Sit to Stand: 6: Modified independent (Device/Increase time) Stand to Sit: 6:  Modified independent (Device/Increase time) Exercises   End of Session OT - End of Session Equipment Utilized During Treatment: Gait belt Activity Tolerance: Patient tolerated treatment well Patient left: in chair;with call bell in reach Nurse Communication: Mobility status for transfers General Behavior During Session: [facility] for tasks performed Cognition: [facility] for tasks performed   Stephaun Million,HILLARY 10/08/2011, 3:10 PM  [facility], OTR/L  [phone] 10/08/2011

## 2011-10-09 NOTE — Discharge Summary (Signed)
Patient ID: [name] MRN: [id number] DOB/AGE: [birth year] 43 y.o.  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 Pneumonia   . AVN (avascular necrosis of bone)     r femural head    Surgeries: Procedure(s): TOTAL HIP ARTHROPLASTY on 10/07/2011   Consultants:    Discharged Condition: Improved  Hospital Course: NIKESH TESCHNER is an 41 y.o. male who was admitted 10/07/2011 for operative treatment ofAvascular necrosis of hip. Patient has severe unremitting pain that affects sleep, daily activities, and work/hobbies. After pre-op clearance the patient was taken to the operating room on 10/07/2011 and underwent  Procedure(s): TOTAL HIP ARTHROPLASTY.    Patient was given perioperative antibiotics: Anti-infectives     Start     Dose/Rate Route Frequency Ordered Stop   10/07/11 1700   ceFAZolin (ANCEF) IVPB 1 g/50 mL premix        1 g 100 mL/hr over 30 Minutes Intravenous Every 6 hours 10/07/11 1523 10/08/11 0612   10/06/11 1445   ceFAZolin (ANCEF) IVPB 1 g/50 mL premix  Status:  Discontinued        1 g 100 mL/hr over 30 Minutes Intravenous 60 min pre-op 10/06/11 1438 10/06/11 1438   10/06/11 1445   ceFAZolin (ANCEF) IVPB 2 g/50 mL premix        2 g 100 mL/hr over 30 Minutes Intravenous 60 min pre-op 10/06/11 1438 10/07/11 1050           Patient was given sequential compression devices, early ambulation, and chemoprophylaxis to prevent DVT.  Patient benefited maximally from hospital stay and there were no complications.    He mobilized quickly and wished to go home post op day #1. He was voiding, eating well, pain well controlled and did well with PT.  Therefore he was discharged home in stable condition.  Recent vital signs: No data found.    Recent laboratory studies:  [facility] 10/08/11 0645  WBC 10.7*   HGB 13.6  HCT 40.3  PLT 317  NA 138  K 3.8  CL 100  CO2 28  BUN 9  CREATININE 0.84  GLUCOSE 118*  INR 1.09  CALCIUM 8.7     Discharge Medications:  Discharge Medication List as of 10/08/2011 11:32 AM    START taking these medications   Details  oxyCODONE-acetaminophen (ROXICET) 5-325 MG per tablet Take 1-2 tablets by mouth every 4 (four) hours as needed for pain., Starting 10/08/2011, Until Tue 10/18/11, Print    warfarin (COUMADIN) 5 MG tablet Take 1 tablet (5 mg total) by mouth daily., Starting 10/08/2011, Until Sun 10/07/12, Print      STOP taking these medications     HYDROcodone-acetaminophen (NORCO) 5-325 MG per tablet         Diagnostic Studies: Dg Pelvis Portable  10/07/2011  *RADIOLOGY REPORT*  Clinical Data: Postoperative exam after right total hip arthroplasty for avascular necrosis  PORTABLE PELVIS  Comparison: None.  Findings: Right total hip arthroplasty is partly visualized, with incomplete visualization of the femoral component.  No visualized hardware failure.  Mild left hip degenerative change noted.  No displaced fracture identified.  IMPRESSION: Expected postoperative appearance after right total hip arthroplasty.  Original Report Authenticated By: Harrel Lemon, M.D.    Disposition: Home or Self Care  Discharge Orders    Future Orders Please Complete By Expires  Diet - low sodium heart healthy      Call MD / Call 911      Comments:   If you experience chest pain or shortness of breath, CALL 911 and be transported to the hospital emergency room.  If you develope a fever above 101 F, pus (white drainage) or increased drainage or redness at the wound, or calf pain, call your surgeon's office.   Constipation Prevention      Comments:   Drink plenty of fluids.  Prune juice may be helpful.  You may use a stool softener, such as Colace (over the counter) 100 mg twice a day.  Use MiraLax (over the counter) for constipation as needed.   Increase activity  slowly as tolerated      Weight Bearing as taught in Physical Therapy      Comments:   Use a walker or crutches as instructed.   Follow the hip precautions as taught in Physical Therapy         Follow-up Information    Follow up with Nestor Lewandowsky, MD in 2 weeks.   Contact information:   [facility] [address] [place] [address] [place] [address] [phone]           Signed: Mable Paris 10/09/2011, 9:38 AM

## 2011-10-10 NOTE — Progress Notes (Signed)
   CARE MANAGEMENT NOTE 10/10/2011  Patient:  [name], [name]   Account Number:  [id number]  Date Initiated:  10/07/2011  Documentation initiated by:  [facility]  Subjective/Objective Assessment:   TOTAL HIP ARTHROPLASTY     Action/Plan:   Kenneth emergency contact [phone]   Anticipated DC Date:  10/08/2011   Anticipated DC Plan:  HOME W HOME HEALTH SERVICES      DC Planning Services  CM consult      Choice offered to / List presented to:             Status of service:  Completed, signed off Medicare Important Message given?   (If response is "NO", the following Medicare IM given date fields will be blank) Date Medicare IM given:   Date Additional Medicare IM given:    Discharge Disposition:  HOME W HOME HEALTH SERVICES  Per UR Regulation:    Comments:  10/10/2011 1500 Pt states he received all of his DME on 10/07/2011 to his room and his Worker's Comp Casework set him up with [name] for [facility] RN and [facility] PT. [name] came out today. Kenneth Donning RN CCM Case Mgmt phone [phone]  10/10/2011 1430 Noted pt d/c home, contacted pt at home to follow up on DME and HHPT. Left message on vm for return call. Kenneth Donning RN CCM Case Mgmt phone [phone]  10/07/2011 1630 Pt gave permission to speak with Kenneth, [name]. [name] states [name], [phone] is the [employer] contact. Attempted call and left message with [name] with NCM contact info. Kenneth Donning RN CCM Case Mgmt phone [phone]

## 2011-10-11 ENCOUNTER — Encounter (HOSPITAL_COMMUNITY): Payer: Self-pay | Admitting: Orthopedic Surgery

## 2011-10-11 NOTE — Progress Notes (Signed)
   CARE MANAGEMENT NOTE 10/11/2011  Patient:  [name], [name]   Account Number:  [id number]  Date Initiated:  10/07/2011  Documentation initiated by:  [facility]  Subjective/Objective Assessment:   TOTAL HIP ARTHROPLASTY     Action/Plan:   sister emergency contact [phone]   Anticipated DC Date:  10/08/2011   Anticipated DC Plan:  HOME W HOME HEALTH SERVICES      DC Planning Services  CM consult      Choice offered to / List presented to:             Status of service:  Completed, signed off Medicare Important Message given?   (If response is "NO", the following Medicare IM given date fields will be blank) Date Medicare IM given:   Date Additional Medicare IM given:    Discharge Disposition:  HOME W HOME HEALTH SERVICES  Per UR Regulation:    Comments:  10/11/2011 1600 Received call back from [place]. States pt's caseworker is Marlou Porch # [phone]. Left msg with Lynden Ang for return call to follow up to see if any additional info needed. Isidoro Donning RN CCM Case Mgmt phone [phone]  10/10/2011 1500 Pt states he received all of his DME on 10/07/2011 to his room and his Worker's Comp Casework set him up with Genevieve Norlander for [facility] RN and [facility] PT. Genevieve Norlander came out today. Isidoro Donning RN CCM Case Mgmt phone [phone]  10/10/2011 1430 Noted pt d/c home, contacted pt at home to follow up on DME and HHPT. Left message on vm for return call. Isidoro Donning RN CCM Case Mgmt phone [phone]  10/07/2011 1630 Pt gave permission to speak with sister, [name]. [name] states [name], [phone] is the [employer] contact. Attempted call and left message with Randa Evens with NCM contact info. Isidoro Donning RN CCM Case Mgmt phone [phone]

## 2012-06-24 ENCOUNTER — Emergency Department: Payer: Self-pay | Admitting: Emergency Medicine

## 2012-06-24 LAB — BASIC METABOLIC PANEL
Calcium, Total: 8.9 mg/dL (ref 8.5–10.1)
Creatinine: 1.3 mg/dL (ref 0.60–1.30)
EGFR (African American): >60
EGFR (Non-African Amer.): >60
Glucose: 172 mg/dL — ABNORMAL HIGH (ref 65–99)
Osmolality: 290 (ref 275–301)
Potassium: 3.8 mmol/L (ref 3.5–5.1)
Sodium: 143 mmol/L (ref 136–145)

## 2012-06-24 LAB — URINALYSIS, COMPLETE
Bilirubin,UR: NEGATIVE
Glucose,UR: NEGATIVE mg/dL (ref 0–75)
Ketone: NEGATIVE
Leukocyte Esterase: NEGATIVE
RBC,UR: 82 /HPF (ref 0–5)
Squamous Epithelial: NONE SEEN

## 2012-06-24 LAB — CBC
MCV: 87 fL (ref 80–100)
Platelet: 327 10*3/uL (ref 150–440)
RDW: 13.8 % (ref 11.5–14.5)
WBC: 6.6 10*3/uL (ref 3.8–10.6)

## 2012-06-26 ENCOUNTER — Emergency Department: Payer: Self-pay | Admitting: Emergency Medicine

## 2012-07-03 ENCOUNTER — Observation Stay: Payer: Self-pay | Admitting: Urology

## 2012-07-03 LAB — COMPREHENSIVE METABOLIC PANEL
Albumin: 3.8 g/dL (ref 3.4–5.0)
Anion Gap: 10 (ref 7–16)
Bilirubin,Total: 0.2 mg/dL (ref 0.2–1.0)
EGFR (African American): 59 — ABNORMAL LOW
Glucose: 83 mg/dL (ref 65–99)
Osmolality: 283 (ref 275–301)
Potassium: 3.6 mmol/L (ref 3.5–5.1)
SGOT(AST): 18 U/L (ref 15–37)
Total Protein: 7.9 g/dL (ref 6.4–8.2)

## 2012-07-03 LAB — CBC WITH DIFFERENTIAL/PLATELET
Basophil %: 0.9 %
Eosinophil #: 0.2 10*3/uL (ref 0.0–0.7)
Eosinophil %: 1.5 %
HCT: 43.3 % (ref 40.0–52.0)
HGB: 14.4 g/dL (ref 13.0–18.0)
Lymphocyte #: 1.6 10*3/uL (ref 1.0–3.6)
MCH: 29.1 pg (ref 26.0–34.0)
MCHC: 33.3 g/dL (ref 32.0–36.0)
MCV: 87 fL (ref 80–100)
Monocyte #: 0.9 x10 3/mm (ref 0.2–1.0)
Neutrophil #: 9.5 10*3/uL — ABNORMAL HIGH (ref 1.4–6.5)
RBC: 4.96 10*6/uL (ref 4.40–5.90)

## 2012-07-03 LAB — URINALYSIS, COMPLETE
Bilirubin,UR: NEGATIVE
Glucose,UR: NEGATIVE mg/dL (ref 0–75)
Ketone: NEGATIVE
Ph: 5 (ref 4.5–8.0)
Squamous Epithelial: NONE SEEN

## 2012-07-03 LAB — PROTIME-INR: INR: 0.9

## 2013-03-02 IMAGING — CR DG PORTABLE PELVIS
1 series · 1 of 1 positions shown · non-contrast
Comparison: None.

CLINICAL DATA: Postoperative exam after right total hip
arthroplasty for avascular necrosis

PORTABLE PELVIS

[view not recorded]
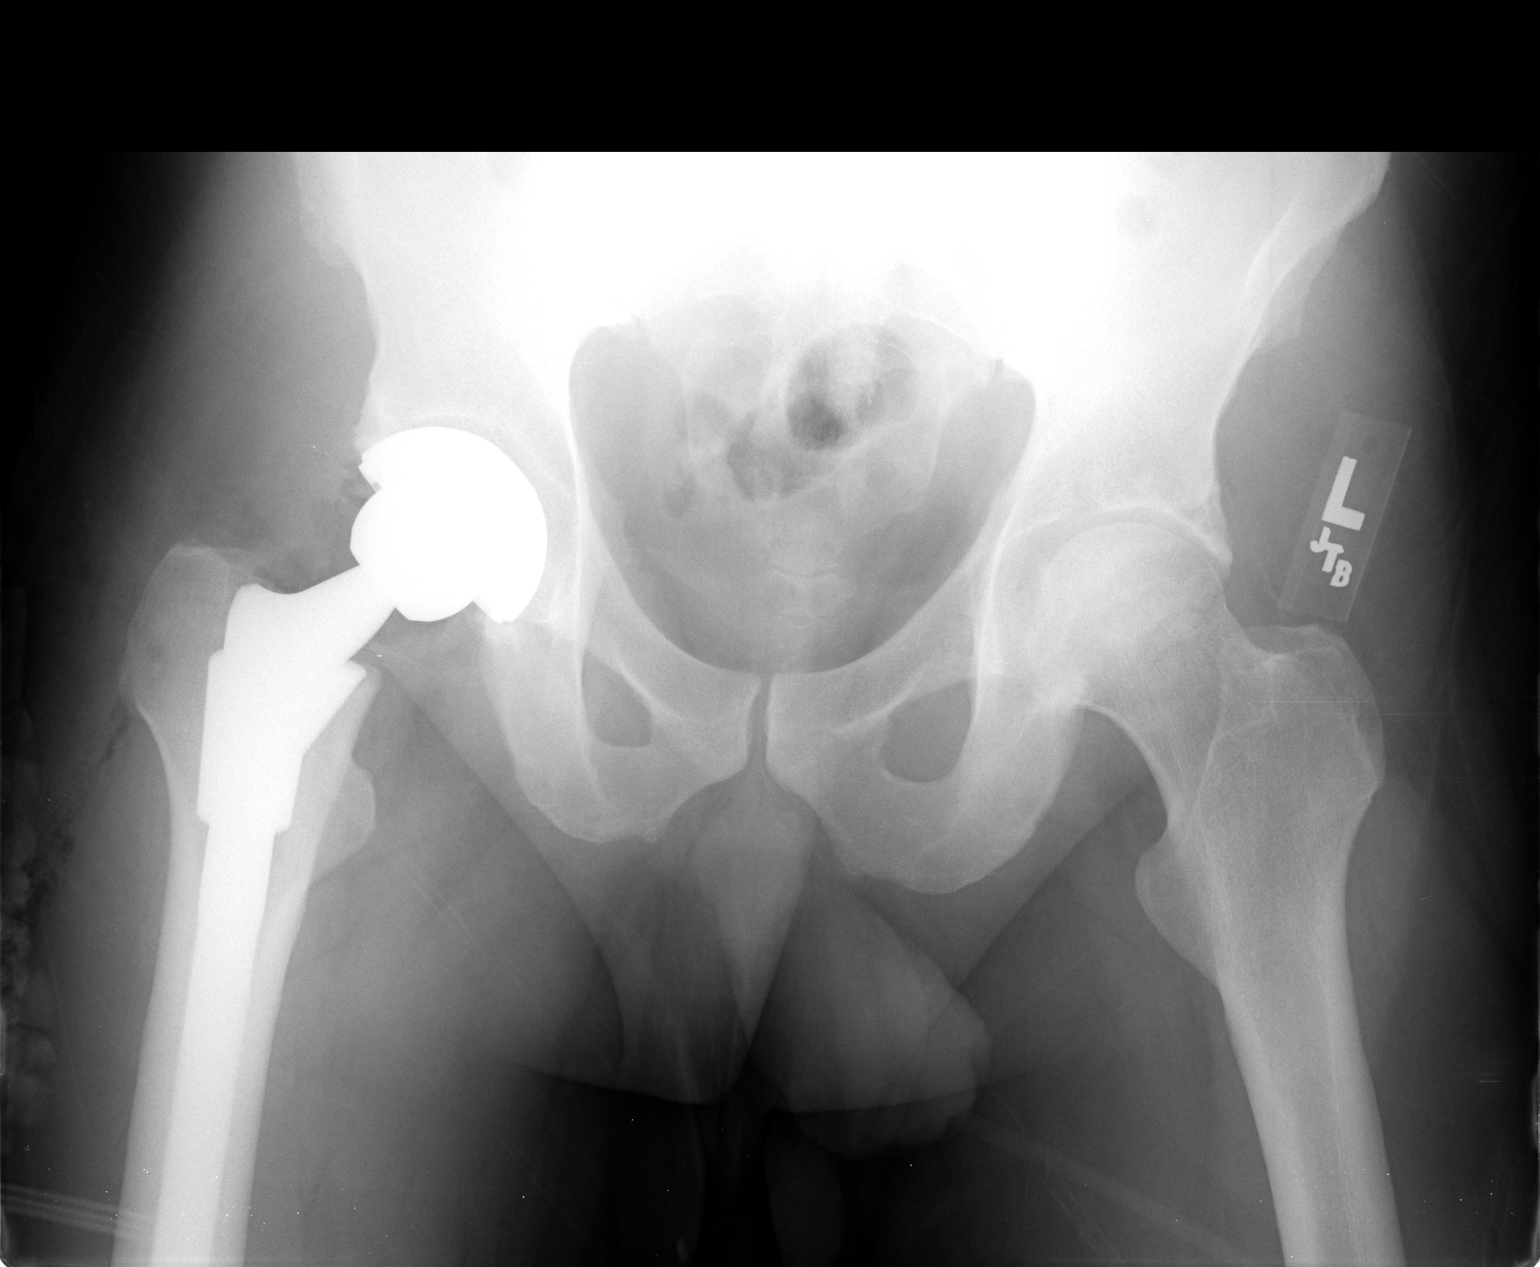

[1 of 1 positions shown; findings below may reference images not displayed]

FINDINGS: Right total hip arthroplasty is partly visualized, with
incomplete visualization of the femoral component.  No visualized
hardware failure.  Mild left hip degenerative change noted.  No
displaced fracture identified.
IMPRESSION: Expected postoperative appearance after right total hip
arthroplasty.

## 2014-04-23 ENCOUNTER — Other Ambulatory Visit: Payer: Self-pay | Admitting: Orthopedic Surgery

## 2014-04-23 DIAGNOSIS — M79671 Pain in right foot: Secondary | ICD-10-CM

## 2014-04-24 ENCOUNTER — Ambulatory Visit
Admission: RE | Admit: 2014-04-24 | Discharge: 2014-04-24 | Disposition: A | Discharge status: Home / Self Care | Payer: No Typology Code available for payment source | Source: Ambulatory Visit | Attending: Orthopedic Surgery | Admitting: Orthopedic Surgery

## 2014-04-24 DIAGNOSIS — M79671 Pain in right foot: Secondary | ICD-10-CM

## 2014-05-10 ENCOUNTER — Emergency Department: Payer: Self-pay | Admitting: Emergency Medicine

## 2014-08-15 ENCOUNTER — Emergency Department: Payer: Self-pay | Admitting: Emergency Medicine

## 2014-08-15 LAB — CBC WITH DIFFERENTIAL/PLATELET
BASOS PCT: 0.3 %
Basophil #: 0 10*3/uL (ref 0.0–0.1)
Eosinophil #: 0 10*3/uL (ref 0.0–0.7)
Eosinophil %: 0.1 %
HCT: 45.1 % (ref 40.0–52.0)
HGB: 15.1 g/dL (ref 13.0–18.0)
LYMPHS ABS: 1.6 10*3/uL (ref 1.0–3.6)
LYMPHS PCT: 16.2 %
MCH: 29.6 pg (ref 26.0–34.0)
MCHC: 33.5 g/dL (ref 32.0–36.0)
MCV: 89 fL (ref 80–100)
MONO ABS: 0.6 x10 3/mm (ref 0.2–1.0)
Monocyte %: 5.9 %
NEUTROS ABS: 7.9 10*3/uL — AB (ref 1.4–6.5)
Neutrophil %: 77.5 %
PLATELETS: 285 10*3/uL (ref 150–440)
RBC: 5.09 10*6/uL (ref 4.40–5.90)
RDW: 14.1 % (ref 11.5–14.5)
WBC: 10.2 10*3/uL (ref 3.8–10.6)

## 2014-08-15 LAB — COMPREHENSIVE METABOLIC PANEL
ALT: 56 U/L
Albumin: 3.7 g/dL (ref 3.4–5.0)
Alkaline Phosphatase: 117 U/L — ABNORMAL HIGH
Anion Gap: 5 — ABNORMAL LOW (ref 7–16)
BUN: 18 mg/dL (ref 7–18)
Bilirubin,Total: 0.2 mg/dL (ref 0.2–1.0)
CHLORIDE: 106 mmol/L (ref 98–107)
CO2: 28 mmol/L (ref 21–32)
CREATININE: 1.12 mg/dL (ref 0.60–1.30)
Calcium, Total: 8.5 mg/dL (ref 8.5–10.1)
EGFR (Non-African Amer.): >60
Glucose: 154 mg/dL — ABNORMAL HIGH (ref 65–99)
OSMOLALITY: 283 (ref 275–301)
POTASSIUM: 3.5 mmol/L (ref 3.5–5.1)
SGOT(AST): 15 U/L (ref 15–37)
Sodium: 139 mmol/L (ref 136–145)
TOTAL PROTEIN: 7.1 g/dL (ref 6.4–8.2)

## 2014-08-15 LAB — PRO B NATRIURETIC PEPTIDE: B-Type Natriuretic Peptide: 34 pg/mL (ref 0–125)

## 2014-08-15 LAB — TROPONIN I

## 2014-08-24 ENCOUNTER — Emergency Department: Payer: Self-pay | Admitting: Emergency Medicine

## 2014-08-24 LAB — CBC
HCT: 46.2 % (ref 40.0–52.0)
HGB: 15.4 g/dL (ref 13.0–18.0)
MCH: 29.4 pg (ref 26.0–34.0)
MCHC: 33.3 g/dL (ref 32.0–36.0)
MCV: 88 fL (ref 80–100)
Platelet: 312 10*3/uL (ref 150–440)
RBC: 5.23 10*6/uL (ref 4.40–5.90)
RDW: 14 % (ref 11.5–14.5)
WBC: 11 10*3/uL — ABNORMAL HIGH (ref 3.8–10.6)

## 2014-08-24 LAB — BASIC METABOLIC PANEL
Anion Gap: 8 (ref 7–16)
BUN: 22 mg/dL — AB (ref 7–18)
CHLORIDE: 106 mmol/L (ref 98–107)
CO2: 27 mmol/L (ref 21–32)
CREATININE: 1.18 mg/dL (ref 0.60–1.30)
Calcium, Total: 8.7 mg/dL (ref 8.5–10.1)
EGFR (African American): >60
EGFR (Non-African Amer.): >60
GLUCOSE: 171 mg/dL — AB (ref 65–99)
Osmolality: 289 (ref 275–301)
Potassium: 3.8 mmol/L (ref 3.5–5.1)
Sodium: 141 mmol/L (ref 136–145)

## 2014-08-24 LAB — INFLUENZA A,B,H1N1 - PCR (ARMC)
H1N1FLUPCR: NOT DETECTED
INFLAPCR: NEGATIVE
INFLBPCR: NEGATIVE

## 2014-08-24 LAB — TROPONIN I

## 2014-08-24 LAB — PRO B NATRIURETIC PEPTIDE: B-Type Natriuretic Peptide: 6 pg/mL (ref 0–125)

## 2015-01-06 NOTE — H&P (Signed)
PATIENT NAMECARDIN, [name] MR#:  [id number] DATE OF BIRTH:  12/09/1970  DATE OF ADMISSION:  07/03/2012  HISTORY: [name] is a 44 year old white gentleman with no prior history of nephrolithiasis. He began experiencing sudden onset right-sided flank pain approximately one week ago. He presented to the Emergency Room for further evaluation. A CT scan demonstrated a 4 mm proximal right ureteral calculus with partial obstruction. He was doing reasonably well with oral pain management until last evening. He had severe worsening of the discomfort. He had been utilizing Toradol. This was discontinued due to the possibility of upcoming lithotripsy for the stone. He was scheduled to see Dr. Bernardo Heater on Wednesday in preparation for possible intervention for the stone. He presented to the Emergency Room with worsening pain and discomfort. A follow-up KUB demonstrated progression of the stone to the level of the right fourth sacral foramen. His pain was subsequently controlled after readministration of Toradol. He was admitted for IV hydration and subsequent pain control with possibility of further intervention.   PAST MEDICAL HISTORY: Avascular necrosis of the right hip.   PAST SURGICAL HISTORY: Right total hip replacement in January of this year.   MEDICATIONS ON ADMISSION: None.   ALLERGIES: No known drug allergies.   PHYSICAL EXAMINATION:   VITAL SIGNS: Stable.   HEENT: Within normal limits.   CHEST: Clear to auscultation bilaterally.   CARDIOVASCULAR: Regular rate and rhythm.   ABDOMEN: Soft, nontender, nondistended at this time. No appreciable CVA tenderness at present.   EXTREMITIES: Free range of motion x4.   NEUROLOGIC: Motor and sensory grossly intact.   ASSESSMENT:  1. Right ureterolithiasis. 2. Hydronephrosis. 3. Renal colic.   RECOMMENDATION: The patient is admitted for IV hydration and pain control. The option of ureteroscopic stone removal versus ESWL was discussed in length  with the patient and his partner. He has elected to try and ride out until lithotripsy on Thursday. We will give him the remainder of the day to see if the pain can be adequately controlled. He can utilize Toradol with the upcoming lithotripsy where the stone is currently located. Positioning, however, would need to be prone due to the close proximity to the edge of the sacrum.   ____________________________ Denice Bors Jacqlyn Larsen, MD bsc:drc D: 07/03/2012 07:44:56 ET T: 07/03/2012 08:08:02 ET JOB#: [id number]  cc: Denice Bors. Jacqlyn Larsen, MD, <Dictator> Denice Bors Jame Seelig MD ELECTRONICALLY SIGNED 07/03/2012 21:24

## 2015-03-26 ENCOUNTER — Encounter: Payer: Self-pay | Admitting: *Deleted

## 2015-03-30 NOTE — Discharge Instructions (Signed)

## 2015-04-01 ENCOUNTER — Ambulatory Visit: Payer: PRIVATE HEALTH INSURANCE | Admitting: Student in an Organized Health Care Education/Training Program

## 2015-04-01 ENCOUNTER — Encounter
Admission: RE | Disposition: A | Discharge status: Home / Self Care | Payer: Self-pay | Source: Ambulatory Visit | Attending: Gastroenterology

## 2015-04-01 ENCOUNTER — Ambulatory Visit
Admission: RE | Admit: 2015-04-01 | Discharge: 2015-04-01 | Disposition: A | Discharge status: Home / Self Care | Payer: PRIVATE HEALTH INSURANCE | Source: Ambulatory Visit | Attending: Gastroenterology | Admitting: Gastroenterology

## 2015-04-01 ENCOUNTER — Other Ambulatory Visit: Payer: Self-pay | Admitting: Gastroenterology

## 2015-04-01 DIAGNOSIS — D122 Benign neoplasm of ascending colon: Secondary | ICD-10-CM | POA: Insufficient documentation

## 2015-04-01 DIAGNOSIS — K635 Polyp of colon: Secondary | ICD-10-CM | POA: Diagnosis not present

## 2015-04-01 DIAGNOSIS — Z96641 Presence of right artificial hip joint: Secondary | ICD-10-CM | POA: Diagnosis not present

## 2015-04-01 DIAGNOSIS — Z1211 Encounter for screening for malignant neoplasm of colon: Secondary | ICD-10-CM | POA: Diagnosis not present

## 2015-04-01 DIAGNOSIS — Z800 Family history of malignant neoplasm of digestive organs: Secondary | ICD-10-CM | POA: Insufficient documentation

## 2015-04-01 DIAGNOSIS — Z791 Long term (current) use of non-steroidal anti-inflammatories (NSAID): Secondary | ICD-10-CM | POA: Diagnosis not present

## 2015-04-01 DIAGNOSIS — D123 Benign neoplasm of transverse colon: Secondary | ICD-10-CM | POA: Diagnosis not present

## 2015-04-01 DIAGNOSIS — Z79899 Other long term (current) drug therapy: Secondary | ICD-10-CM | POA: Insufficient documentation

## 2015-04-01 DIAGNOSIS — D124 Benign neoplasm of descending colon: Secondary | ICD-10-CM | POA: Diagnosis not present

## 2015-04-01 HISTORY — PX: POLYPECTOMY: SHX5525

## 2015-04-01 HISTORY — PX: COLONOSCOPY: SHX5424

## 2015-04-01 SURGERY — COLONOSCOPY
Anesthesia: Monitor Anesthesia Care | Wound class: Contaminated

## 2015-04-01 MED ORDER — PROPOFOL 10 MG/ML IV BOLUS
INTRAVENOUS | Status: DC | PRN
  Administered 2015-04-01: 50 mg via INTRAVENOUS
  Administered 2015-04-01: 100 mg via INTRAVENOUS
  Administered 2015-04-01: 50 mg via INTRAVENOUS

## 2015-04-01 MED ORDER — LIDOCAINE HCL (CARDIAC) 20 MG/ML IV SOLN
INTRAVENOUS | Status: DC | PRN
  Administered 2015-04-01: 50 mg via INTRAVENOUS

## 2015-04-01 MED ORDER — LACTATED RINGERS IV SOLN: INTRAVENOUS | Status: DC

## 2015-04-01 MED ORDER — STERILE WATER FOR IRRIGATION IR SOLN
Status: DC | PRN
  Administered 2015-04-01: 08:00:00

## 2015-04-01 MED ORDER — LACTATED RINGERS IV SOLN
INTRAVENOUS | Status: DC
  Administered 2015-04-01 (×2): via INTRAVENOUS

## 2015-04-01 MED ORDER — ACETAMINOPHEN 325 MG PO TABS: 325.0000 mg | ORAL_TABLET | ORAL | Status: DC | PRN

## 2015-04-01 MED ORDER — ACETAMINOPHEN 160 MG/5ML PO SOLN
325.0000 mg | ORAL | Status: DC | PRN
Start: 2015-04-01 — End: 2015-04-01

## 2015-04-01 SURGICAL SUPPLY — 28 items
CANISTER SUCT 1200ML W/VALVE (MISCELLANEOUS) ×4 IMPLANT
FCP ESCP3.2XJMB 240X2.8X (MISCELLANEOUS)
FORCEPS BIOP RAD 4 LRG CAP 4 (CUTTING FORCEPS) ×4 IMPLANT
FORCEPS BIOP RJ4 240 W/NDL (MISCELLANEOUS)
FORCEPS ESCP3.2XJMB 240X2.8X (MISCELLANEOUS) IMPLANT
GOWN CVR UNV OPN BCK APRN NK (MISCELLANEOUS) ×4 IMPLANT
GOWN ISOL THUMB LOOP REG UNIV (MISCELLANEOUS) ×4
HEMOCLIP INSTINCT (CLIP) IMPLANT
INJECTOR VARIJECT VIN23 (MISCELLANEOUS) IMPLANT
KIT CO2 TUBING (TUBING) IMPLANT
KIT DEFENDO VALVE AND CONN (KITS) IMPLANT
KIT ENDO PROCEDURE OLY (KITS) ×4 IMPLANT
LIGATOR MULTIBAND 6SHOOTER MBL (MISCELLANEOUS) IMPLANT
MARKER SPOT ENDO TATTOO 5ML (MISCELLANEOUS) IMPLANT
PAD GROUND ADULT SPLIT (MISCELLANEOUS) IMPLANT
SNARE SHORT THROW 13M SML OVAL (MISCELLANEOUS) ×4 IMPLANT
SNARE SHORT THROW 30M LRG OVAL (MISCELLANEOUS) IMPLANT
SPOT EX ENDOSCOPIC TATTOO (MISCELLANEOUS)
SUCTION POLY TRAP 4CHAMBER (MISCELLANEOUS) IMPLANT
TRAP SUCTION POLY (MISCELLANEOUS) ×4 IMPLANT
TUBING CONN 6MMX3.1M (TUBING)
TUBING SUCTION CONN 0.25 STRL (TUBING) IMPLANT
UNDERPAD 30X60 958B10 (PK) (MISCELLANEOUS) IMPLANT
VALVE BIOPSY ENDO (VALVE) IMPLANT
VARIJECT INJECTOR VIN23 (MISCELLANEOUS)
WATER AUXILLARY (MISCELLANEOUS) IMPLANT
WATER STERILE IRR 250ML POUR (IV SOLUTION) ×4 IMPLANT
WATER STERILE IRR 500ML POUR (IV SOLUTION) IMPLANT

## 2015-04-01 NOTE — Anesthesia Postprocedure Evaluation (Signed)
  Anesthesia Post-op Note  Patient: [name]  Procedure(s) Performed: Procedure(s): COLONOSCOPY (N/A) POLYPECTOMY  Anesthesia type:MAC  Patient location: PACU  Post pain: Pain level controlled  Post assessment: Post-op Vital signs reviewed, Patient's Cardiovascular Status Stable, Respiratory Function Stable, Patent Airway and No signs of Nausea or vomiting  Post vital signs: Reviewed and stable  Last Vitals:  Filed Vitals:   04/01/15 0800  BP: 110/69  Pulse: 79  Temp:   Resp: 16    Level of consciousness: awake, alert  and patient cooperative  Complications: No apparent anesthesia complications

## 2015-04-01 NOTE — Anesthesia Preprocedure Evaluation (Signed)
Anesthesia Evaluation  Patient identified by MRN, date of birth, ID band  Reviewed: Allergy & Precautions, H&P , NPO status , Patient's Chart, lab work & pertinent test results  Airway Mallampati: III  TM Distance: >3 FB Neck ROM: full    Dental no notable dental hx.    Pulmonary    Pulmonary exam normal       Cardiovascular Rhythm:regular Rate:Normal     Neuro/Psych    GI/Hepatic   Endo/Other    Renal/GU      Musculoskeletal   Abdominal   Peds  Hematology   Anesthesia Other Findings   Reproductive/Obstetrics                             Anesthesia Physical Anesthesia Plan  ASA: II  Anesthesia Plan: MAC   Post-op Pain Management:    Induction:   Airway Management Planned:   Additional Equipment:   Intra-op Plan:   Post-operative Plan:   Informed Consent: I have reviewed the patients History and Physical, chart, labs and discussed the procedure including the risks, benefits and alternatives for the proposed anesthesia with the patient or authorized representative who has indicated his/her understanding and acceptance.     Plan Discussed with: CRNA  Anesthesia Plan Comments:         Anesthesia Quick Evaluation

## 2015-04-01 NOTE — Op Note (Signed)
[facility]enterology Patient Name: [name] Procedure Date: 04/01/2015 7:26 AM MRN: [id number] Account #: [id number] Date of Birth: Feb 20, 1971 Admit Type: Outpatient Age: 44 Room: [facility] OR ROOM 01 Gender: Male Note Status: Finalized Procedure:         Colonoscopy Indications:       Family history of colon cancer in a first-degree relative Providers:         [name], MD Referring MD:      Janine Ores. Rosanna Randy, MD (Referring MD) Medicines:         Propofol per Anesthesia Complications:     No immediate complications. Procedure:         Pre-Anesthesia Assessment:                    - Prior to the procedure, a History and Physical was                     performed, and patient medications and allergies were                     reviewed. The patient's tolerance of previous anesthesia                     was also reviewed. The risks and benefits of the procedure                     and the sedation options and risks were discussed with the                     patient. All questions were answered, and informed consent                     was obtained. Prior Anticoagulants: The patient has taken                     no previous anticoagulant or antiplatelet agents. ASA                     Grade Assessment: II - A patient with mild systemic                     disease. After reviewing the risks and benefits, the                     patient was deemed in satisfactory condition to undergo                     the procedure.                    After obtaining informed consent, the colonoscope was                     passed under direct vision. Throughout the procedure, the                     patient's blood pressure, pulse, and oxygen saturations                     were monitored continuously. The was introduced through                     the anus and advanced to the the cecum, identified by  appendiceal orifice and ileocecal valve. The  colonoscopy                     was performed without difficulty. The patient tolerated                     the procedure well. The quality of the bowel preparation                     was excellent. Findings:      The perianal and digital rectal examinations were normal.      Two sessile polyps were found in the transverse colon. The polyps were 4       to 6 mm in size. These polyps were removed with a cold snare. Resection       and retrieval were complete.      A 7 mm polyp was found in the descending colon. The polyp was sessile.       The polyp was removed with a cold snare. Resection and retrieval were       complete.      Two sessile polyps were found in the descending colon. The polyps were 2       to 4 mm in size. These polyps were removed with a cold biopsy forceps.       Resection and retrieval were complete.      A 5 mm polyp was found in the ascending colon. The polyp was sessile.       The polyp was removed with a cold biopsy forceps. Resection and       retrieval were complete. Impression:        - Two 4 to 6 mm polyps in the transverse colon. Resected                     and retrieved.                    - One 7 mm polyp in the descending colon. Resected and                     retrieved.                    - Two 2 to 4 mm polyps in the descending colon. Resected                     and retrieved.                    - One 5 mm polyp in the ascending colon. Resected and                     retrieved. Recommendation:    - Await pathology results.                    - Repeat colonoscopy in 3 years for surveillance. Procedure Code(s): --- Professional ---                    [phone], Colonoscopy, flexible; with removal of tumor(s),                     polyp(s), or other lesion(s) by snare technique                    45380, 59, Colonoscopy, flexible; with  biopsy, single or                     multiple Diagnosis Code(s): --- Professional ---                    Z80.0, Family  history of malignant neoplasm of digestive                     organs                    D12.3, Benign neoplasm of transverse colon                    D12.4, Benign neoplasm of descending colon                    D12.2, Benign neoplasm of ascending colon CPT copyright 2014 American Medical Association. All rights reserved. The codes documented in this report are preliminary and upon coder review may  be revised to meet current compliance requirements. [name], MD 04/01/2015 7:54:01 AM This report has been signed electronically. Number of Addenda: 0 Note Initiated On: 04/01/2015 7:26 AM Total Procedure Duration: 0 hours 13 minutes 12 seconds       [facility]

## 2015-04-01 NOTE — Anesthesia Procedure Notes (Signed)
Procedure Name: MAC Performed by: Jonasia Coiner Pre-anesthesia Checklist: Patient identified, Emergency Drugs available, Suction available, Timeout performed and Patient being monitored Patient Re-evaluated:Patient Re-evaluated prior to inductionOxygen Delivery Method: Nasal cannula Placement Confirmation: positive ETCO2     

## 2015-04-01 NOTE — H&P (Signed)
  [facility]  [address] [place], [place] [address] Phone: [phone] Fax : [phone]  Primary Care Physician:  Versie Starks, PA-C Primary Gastroenterologist:  Dr. Allen Norris  Pre-Procedure History & Physical: HPI:  [name] is a 44 y.o. male is here for an colonoscopy.   Past Medical History  Diagnosis Date  . Pneumonia   . 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 Ganglion cyst excision    . Tongue flap release    . Total hip arthroplasty  10/07/2011    Procedure: TOTAL HIP ARTHROPLASTY;  Surgeon: Kerin Salen, MD;  Location: [place];  Service: Orthopedics;  Laterality: Right;    Prior to Admission medications   Medication Sig Start Date End Date Taking? Authorizing Provider  Ascorbic Acid (VITAMIN C PO) Take by mouth.   Yes Historical Provider, MD  meloxicam (MOBIC) 15 MG tablet Take 15 mg by mouth daily.   Yes Historical Provider, MD    Allergies as of 03/06/2015  . (No Known Allergies)    History reviewed. No pertinent family history.  History   Social History  . Marital Status: Single    Spouse Name: N/A  . Number of Children: N/A  . Years of Education: N/A   Occupational History  . Not on file.   Social History Main Topics  . Smoking status: Never Smoker   . Smokeless tobacco: Never Used  . Alcohol Use: No  . Drug Use: No  . Sexual Activity: Yes   Other Topics Concern  . Not on file   Social History Narrative    Review of Systems: See HPI, otherwise negative ROS  Physical Exam: BP 119/67 mmHg  Pulse 77  Temp(Src) 97.9 F (36.6 C) (Temporal)  Resp 18  Ht 6\' 2"  (1.88 m)  Wt 254 lb (115.214 kg)  BMI 32.60 kg/m2  SpO2 98% General:   Alert,  pleasant and cooperative in NAD Head:  Normocephalic and atraumatic. Neck:  Supple; no masses or thyromegaly. Lungs:  Clear throughout to auscultation.    Heart:  Regular rate and rhythm. Abdomen:  Soft,  nontender and nondistended. Normal bowel sounds, without guarding, and without rebound.   Neurologic:  Alert and  oriented x4;  grossly normal neurologically.  Impression/Plan: [name] is here for an colonoscopy to be performed for family history of colon cancer  Risks, benefits, limitations, and alternatives regarding  colonoscopy have been reviewed with the patient.  Questions have been answered.  All parties agreeable.   Ollen Bowl, MD  04/01/2015, 7:28 AM

## 2015-04-01 NOTE — Transfer of Care (Signed)
Immediate Anesthesia Transfer of Care Note  Patient: [name]  Procedure(s) Performed: Procedure(s): COLONOSCOPY (N/A) POLYPECTOMY  Patient Location: PACU  Anesthesia Type: MAC  Level of Consciousness: awake, alert  and patient cooperative  Airway and Oxygen Therapy: Patient Spontanous Breathing and Patient connected to supplemental oxygen  Post-op Assessment: Post-op Vital signs reviewed, Patient's Cardiovascular Status Stable, Respiratory Function Stable, Patent Airway and No signs of Nausea or vomiting  Post-op Vital Signs: Reviewed and stable  Complications: No apparent anesthesia complications

## 2015-04-02 ENCOUNTER — Encounter: Payer: Self-pay | Admitting: Gastroenterology

## 2015-04-14 ENCOUNTER — Encounter: Payer: Self-pay | Admitting: Gastroenterology

## 2015-06-24 ENCOUNTER — Telehealth: Payer: Self-pay | Admitting: Emergency Medicine

## 2015-06-24 DIAGNOSIS — R110 Nausea: Secondary | ICD-10-CM

## 2015-06-24 MED ORDER — ONDANSETRON HCL 4 MG PO TABS: 4.0000 mg | ORAL_TABLET | Freq: Three times a day (TID) | ORAL | Status: DC | PRN

## 2015-06-24 NOTE — Telephone Encounter (Signed)
Pt states that he thinks he had food poisoning and is having a lot of nausea.  Wants script for Zofran.

## 2015-07-17 ENCOUNTER — Other Ambulatory Visit: Payer: Self-pay | Admitting: Physician Assistant

## 2015-07-17 DIAGNOSIS — E7800 Pure hypercholesterolemia, unspecified: Secondary | ICD-10-CM

## 2015-07-17 DIAGNOSIS — F909 Attention-deficit hyperactivity disorder, unspecified type: Secondary | ICD-10-CM | POA: Insufficient documentation

## 2015-07-17 DIAGNOSIS — Z125 Encounter for screening for malignant neoplasm of prostate: Secondary | ICD-10-CM

## 2015-07-17 DIAGNOSIS — Z79899 Other long term (current) drug therapy: Secondary | ICD-10-CM

## 2015-07-17 DIAGNOSIS — I10 Essential (primary) hypertension: Secondary | ICD-10-CM

## 2015-07-17 DIAGNOSIS — R739 Hyperglycemia, unspecified: Secondary | ICD-10-CM

## 2015-07-17 DIAGNOSIS — N200 Calculus of kidney: Secondary | ICD-10-CM | POA: Insufficient documentation

## 2015-07-17 NOTE — Progress Notes (Signed)
Patient wants labs results to be sent to Dr. Fulton Reek at [facility].  Fax to [phone].

## 2015-07-18 LAB — CMP12+LP+TP+TSH+6AC+PSA+CBC…
ALT: 35 IU/L (ref 0–44)
AST: 22 IU/L (ref 0–40)
Albumin/Globulin Ratio: 1.9 (ref 1.1–2.5)
Albumin: 4.4 g/dL (ref 3.5–5.5)
Alkaline Phosphatase: 111 IU/L (ref 39–117)
BASOS ABS: 0 10*3/uL (ref 0.0–0.2)
BILIRUBIN TOTAL: 0.3 mg/dL (ref 0.0–1.2)
BUN/Creatinine Ratio: 14 (ref 9–20)
BUN: 14 mg/dL (ref 6–24)
Basos: 0 %
CHOLESTEROL TOTAL: 216 mg/dL — AB (ref 100–199)
CREATININE: 0.99 mg/dL (ref 0.76–1.27)
Calcium: 9.5 mg/dL (ref 8.7–10.2)
Chloride: 101 mmol/L (ref 97–106)
Chol/HDL Ratio: 6.5 ratio units — ABNORMAL HIGH (ref 0.0–5.0)
EOS (ABSOLUTE): 0.2 10*3/uL (ref 0.0–0.4)
Eos: 3 %
Estimated CHD Risk: 1.4 times avg. — ABNORMAL HIGH (ref 0.0–1.0)
FREE THYROXINE INDEX: 2.3 (ref 1.2–4.9)
GFR, EST AFRICAN AMERICAN: 106 mL/min/{1.73_m2} (ref >59)
GFR, EST NON AFRICAN AMERICAN: 92 mL/min/{1.73_m2} (ref >59)
GGT: 58 IU/L (ref 0–65)
GLOBULIN, TOTAL: 2.3 g/dL (ref 1.5–4.5)
GLUCOSE: 95 mg/dL (ref 65–99)
HDL: 33 mg/dL — ABNORMAL LOW (ref >39)
Hematocrit: 44.6 % (ref 37.5–51.0)
Hemoglobin: 15 g/dL (ref 12.6–17.7)
IMMATURE GRANS (ABS): 0 10*3/uL (ref 0.0–0.1)
IRON: 52 ug/dL (ref 38–169)
Immature Granulocytes: 0 %
LDH: 193 IU/L (ref 121–224)
LDL Calculated: 123 mg/dL — ABNORMAL HIGH (ref 0–99)
LYMPHS: 26 %
Lymphocytes Absolute: 1.6 10*3/uL (ref 0.7–3.1)
MCH: 29.2 pg (ref 26.6–33.0)
MCHC: 33.6 g/dL (ref 31.5–35.7)
MCV: 87 fL (ref 79–97)
MONOCYTES: 7 %
MONOS ABS: 0.5 10*3/uL (ref 0.1–0.9)
NEUTROS PCT: 64 %
Neutrophils Absolute: 4 10*3/uL (ref 1.4–7.0)
PHOSPHORUS: 2.7 mg/dL (ref 2.5–4.5)
PLATELETS: 339 10*3/uL (ref 150–379)
PROSTATE SPECIFIC AG, SERUM: 0.4 ng/mL (ref 0.0–4.0)
Potassium: 4.2 mmol/L (ref 3.5–5.2)
RBC: 5.14 x10E6/uL (ref 4.14–5.80)
RDW: 13.8 % (ref 12.3–15.4)
SODIUM: 140 mmol/L (ref 136–144)
T3 Uptake Ratio: 23 % — ABNORMAL LOW (ref 24–39)
T4 TOTAL: 10.1 ug/dL (ref 4.5–12.0)
TOTAL PROTEIN: 6.7 g/dL (ref 6.0–8.5)
TRIGLYCERIDES: 302 mg/dL — AB (ref 0–149)
TSH: 2.99 u[IU]/mL (ref 0.450–4.500)
Uric Acid: 7.9 mg/dL (ref 3.7–8.6)
VLDL Cholesterol Cal: 60 mg/dL — ABNORMAL HIGH (ref 5–40)
WBC: 6.3 10*3/uL (ref 3.4–10.8)

## 2015-07-18 LAB — URINALYSIS, ROUTINE W REFLEX MICROSCOPIC
BILIRUBIN UA: NEGATIVE
GLUCOSE, UA: NEGATIVE
Ketones, UA: NEGATIVE
Leukocytes, UA: NEGATIVE
Nitrite, UA: NEGATIVE
PH UA: 6.5 (ref 5.0–7.5)
PROTEIN UA: NEGATIVE
RBC, UA: NEGATIVE
Specific Gravity, UA: 1.021 (ref 1.005–1.030)
UUROB: 0.2 mg/dL (ref 0.2–1.0)

## 2015-07-18 LAB — HGB A1C W/O EAG: HEMOGLOBIN A1C: 6 % — AB (ref 4.8–5.6)

## 2015-07-29 NOTE — Progress Notes (Signed)
Lab results were faxed to Dr. Doy Hutching per pt's request.

## 2015-07-29 NOTE — Progress Notes (Signed)
Lab results were faxed to Dr. Doy Hutching per patient's request.

## 2015-08-28 ENCOUNTER — Other Ambulatory Visit: Payer: Self-pay | Admitting: Physician Assistant

## 2015-08-28 ENCOUNTER — Ambulatory Visit: Payer: Self-pay | Admitting: Physician Assistant

## 2015-08-28 ENCOUNTER — Encounter: Payer: Self-pay | Admitting: Physician Assistant

## 2015-08-28 VITALS — BP 130/80 | HR 83 | Temp 98.7°F

## 2015-08-28 DIAGNOSIS — J018 Other acute sinusitis: Secondary | ICD-10-CM

## 2015-08-28 MED ORDER — AMOXICILLIN-POT CLAVULANATE 875-125 MG PO TABS: 1.0000 | ORAL_TABLET | Freq: Two times a day (BID) | ORAL | Status: DC

## 2015-08-28 NOTE — Progress Notes (Signed)
S: C/o runny nose and congestion for 3 days, no fever, chills, cp/sob, v/d; mucus is green and thick, cough is sporadic, c/o of facial and dental pain.   Using otc meds:   O: PE: vitals wnl, nad, perrl eomi, normocephalic, tms dull, nasal mucosa red and swollen, throat injected, neck supple no lymph, lungs c t a, cv rrr, neuro intact  A:  Acute sinusitis   P: augmentin 875mg  bid x 10d, drink fluids, continue regular meds , use otc meds of choice, return if not improving in 5 days, return earlier if worsening

## 2015-10-22 DIAGNOSIS — E782 Mixed hyperlipidemia: Secondary | ICD-10-CM | POA: Insufficient documentation

## 2015-12-15 ENCOUNTER — Encounter: Payer: Self-pay | Admitting: Physician Assistant

## 2015-12-15 ENCOUNTER — Ambulatory Visit: Payer: Self-pay | Admitting: Physician Assistant

## 2015-12-15 VITALS — BP 140/80 | HR 112 | Temp 98.5°F

## 2015-12-15 DIAGNOSIS — J069 Acute upper respiratory infection, unspecified: Secondary | ICD-10-CM

## 2015-12-15 DIAGNOSIS — R52 Pain, unspecified: Secondary | ICD-10-CM

## 2015-12-15 LAB — POCT INFLUENZA A/B
INFLUENZA A, POC: NEGATIVE
Influenza B, POC: NEGATIVE

## 2015-12-15 MED ORDER — AMOXICILLIN 875 MG PO TABS: 875.0000 mg | ORAL_TABLET | Freq: Two times a day (BID) | ORAL | Status: DC

## 2015-12-15 MED ORDER — METHYLPREDNISOLONE 4 MG PO TBPK: ORAL_TABLET | ORAL | Status: AC

## 2015-12-15 MED ORDER — HYDROCOD POLST-CPM POLST ER 10-8 MG/5ML PO SUER: 5.0000 mL | Freq: Two times a day (BID) | ORAL | Status: DC | PRN

## 2015-12-15 MED ORDER — ALBUTEROL SULFATE HFA 108 (90 BASE) MCG/ACT IN AERS: 2.0000 | INHALATION_SPRAY | Freq: Four times a day (QID) | RESPIRATORY_TRACT | Status: DC | PRN

## 2015-12-15 NOTE — Progress Notes (Signed)
S: C/o runny nose and congestion with dry cough for 2-3 days, + fever, chills, bodyaches, cough/ wheezing with fatigue, mucus has been yellow and bloody;  denies cp/sob, v/d;   Using otc meds: nyquil  O: PE: vitals wnl, nad,  perrl eomi, normocephalic, tms dull, nasal mucosa red and swollen, throat injected, neck supple no lymph, lungs c t a, cv rrr, neuro intact, flu swab neg  A:  Acute flu like illness   P: tussionex, medrol dose pack, albuterol inhaler, amoxil, drink fluids, continue regular meds , use otc meds of choice, return if not improving in 5 days, return earlier if worsening

## 2015-12-17 ENCOUNTER — Ambulatory Visit: Payer: Self-pay | Admitting: Physician Assistant

## 2016-01-09 IMAGING — CR DG CHEST 2V
1 series · 2 of 2 positions shown · non-contrast
Comparison: Chest radiograph performed 06/29/2011

CLINICAL DATA: Acute onset of shortness of breath and sinus
infection. Patient on antibiotics, with worsening symptoms. Initial
encounter.

EXAM:
CHEST  2 VIEW

[Series 1: dxr chest pa (or ap) and lateral · 0.14mm/px · 2 of 2 slices shown]
[im 1/2]
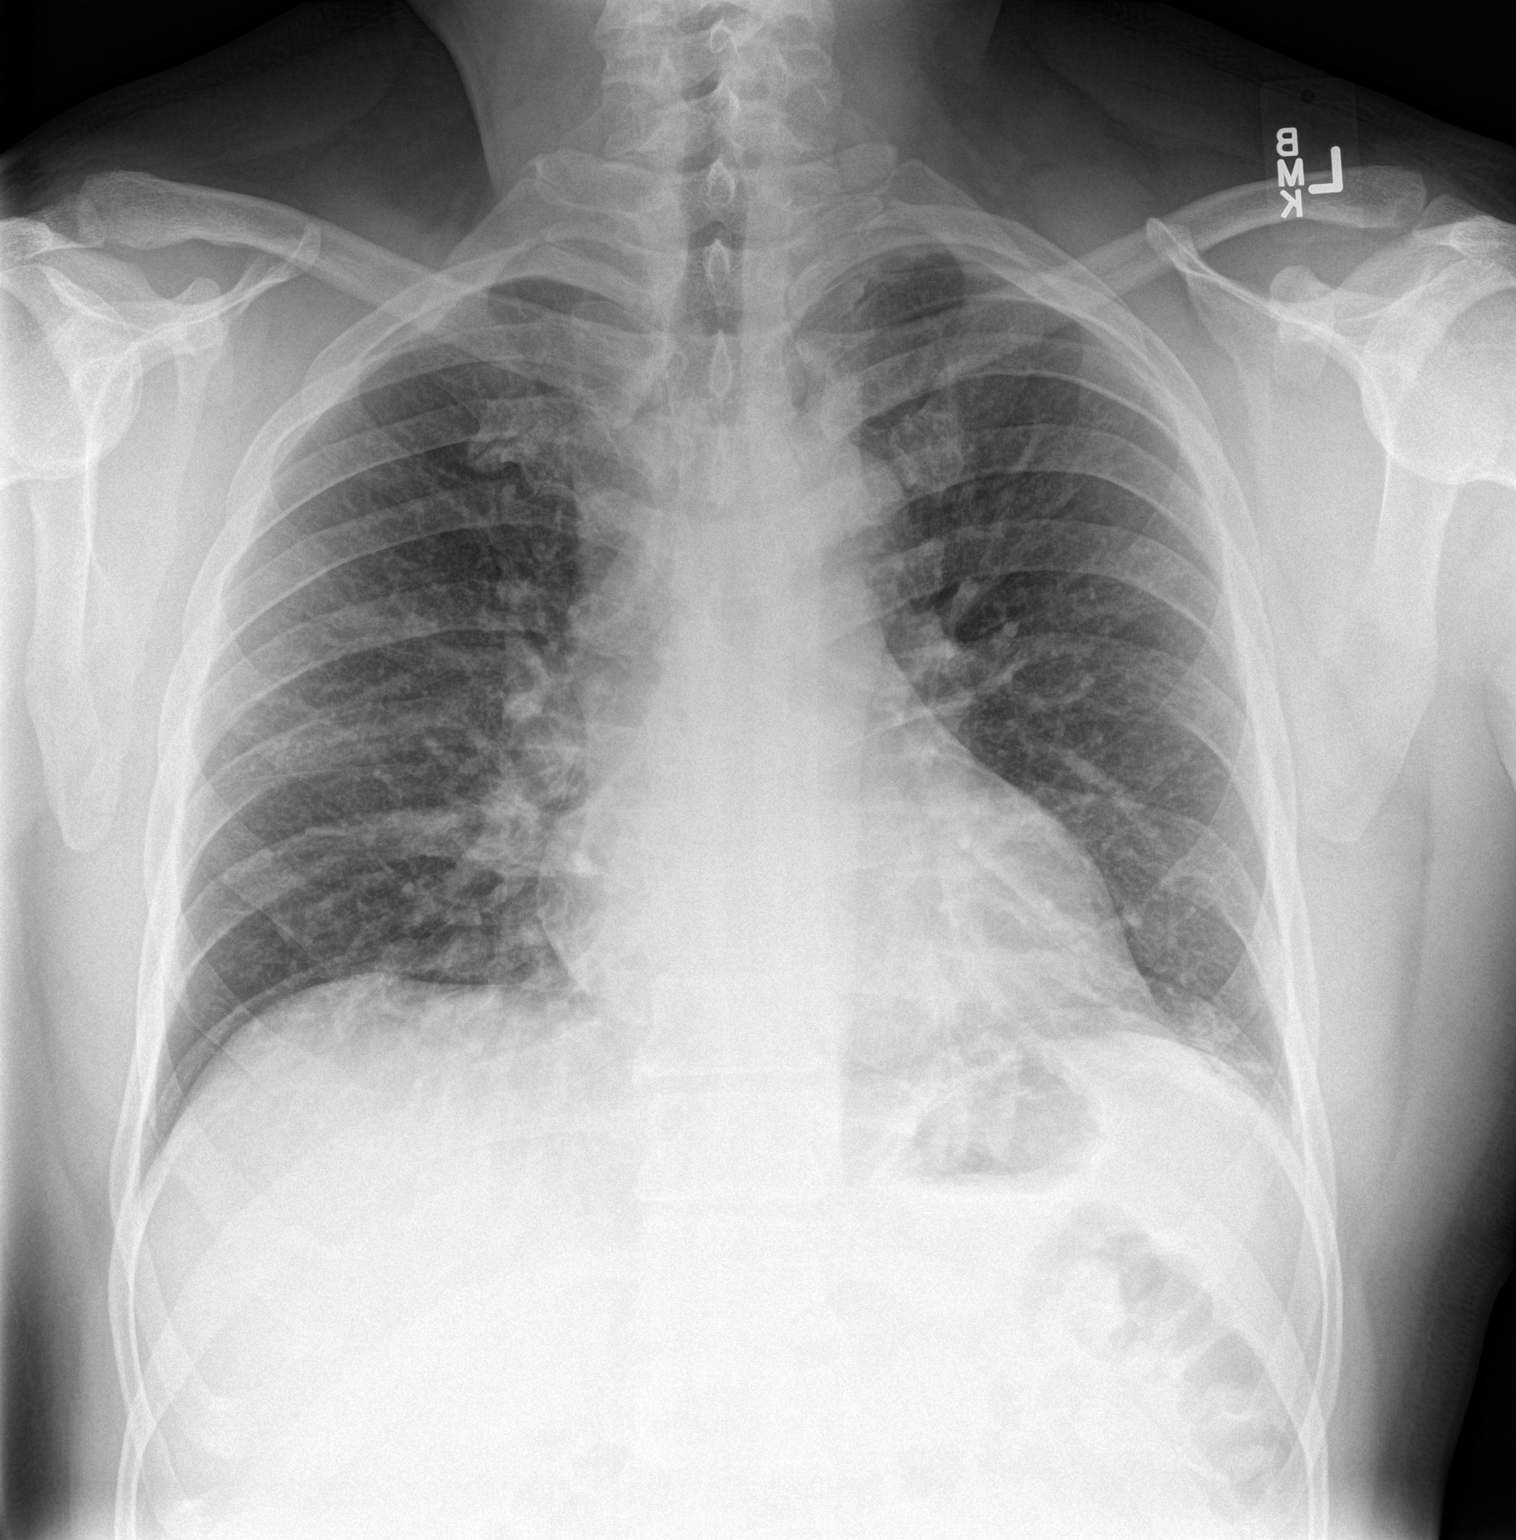
[im 2/2]
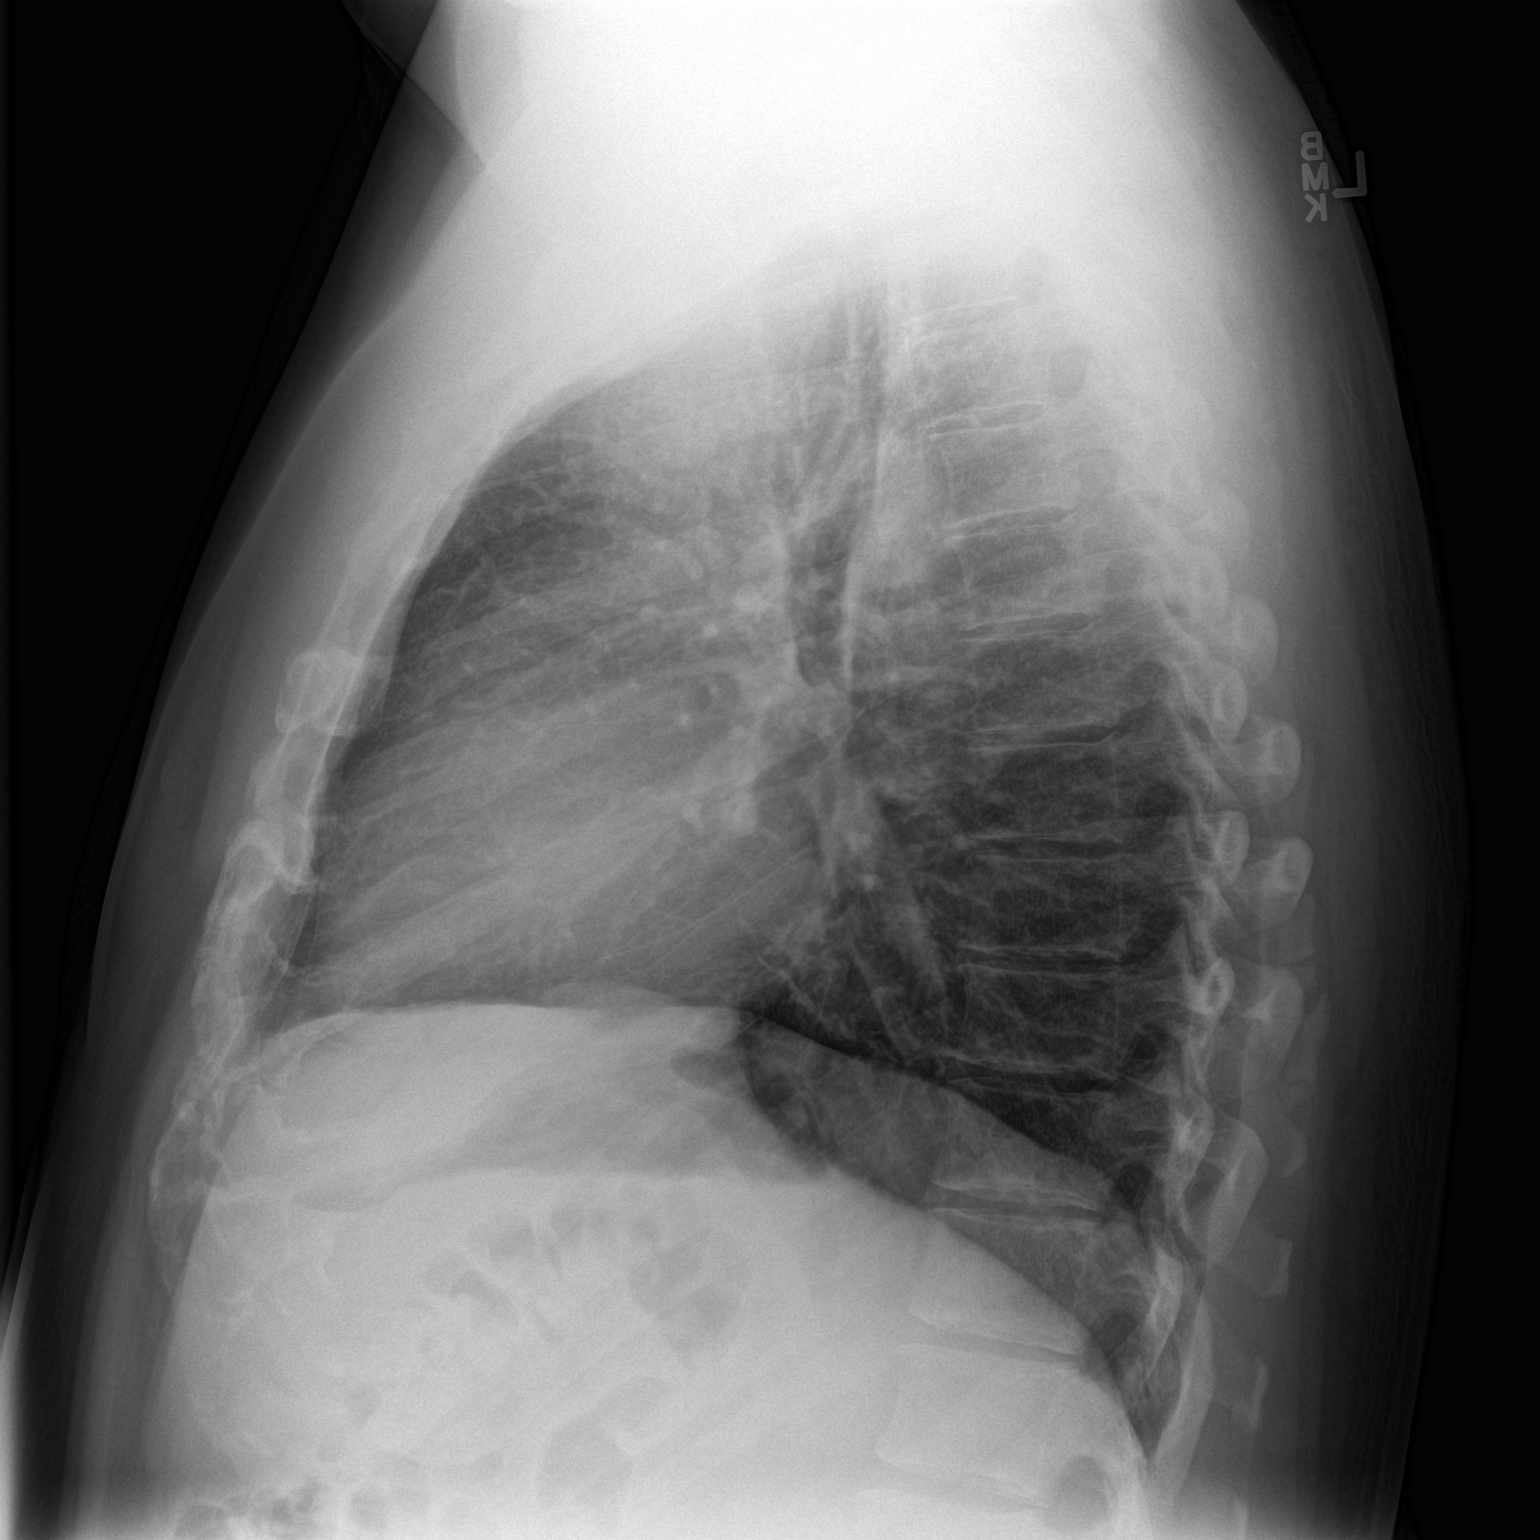

[2 of 2 positions shown; findings below may reference images not displayed]

FINDINGS: The lungs are well-aerated. Vascular congestion is noted. Minimal
bilateral atelectasis is seen. There is no evidence of pleural
effusion or pneumothorax.

The heart is borderline normal in size. No acute osseous
abnormalities are seen.
IMPRESSION: Vascular congestion, without definite pulmonary edema. Minimal
bilateral atelectasis seen.

## 2016-01-19 ENCOUNTER — Other Ambulatory Visit: Payer: Self-pay | Admitting: Physician Assistant

## 2016-01-19 DIAGNOSIS — Z299 Encounter for prophylactic measures, unspecified: Secondary | ICD-10-CM

## 2016-01-19 NOTE — Progress Notes (Signed)
Patient came in to have blood drawn per Dr. Stacie Glaze orders.  Blood was drawn from the left arm without any incident.

## 2016-01-20 LAB — COMPREHENSIVE METABOLIC PANEL WITH GFR
ALT: 35 IU/L (ref 0–44)
AST: 15 IU/L (ref 0–40)
Albumin/Globulin Ratio: 1.9 (ref 1.2–2.2)
Albumin: 4.2 g/dL (ref 3.5–5.5)
Alkaline Phosphatase: 107 IU/L (ref 39–117)
BUN/Creatinine Ratio: 14 (ref 9–20)
BUN: 14 mg/dL (ref 6–24)
Bilirubin Total: <0.2 mg/dL (ref 0.0–1.2)
CO2: 25 mmol/L (ref 18–29)
Calcium: 9.2 mg/dL (ref 8.7–10.2)
Chloride: 102 mmol/L (ref 96–106)
Creatinine, Ser: 0.98 mg/dL (ref 0.76–1.27)
GFR calc Af Amer: 106 mL/min/1.73
GFR calc non Af Amer: 92 mL/min/1.73
Globulin, Total: 2.2 g/dL (ref 1.5–4.5)
Glucose: 87 mg/dL (ref 65–99)
Potassium: 3.9 mmol/L (ref 3.5–5.2)
Sodium: 145 mmol/L — ABNORMAL HIGH (ref 134–144)
Total Protein: 6.4 g/dL (ref 6.0–8.5)

## 2016-01-20 LAB — LIPID PANEL
Chol/HDL Ratio: 5.7 ratio units — ABNORMAL HIGH (ref 0.0–5.0)
Cholesterol, Total: 194 mg/dL (ref 100–199)
HDL: 34 mg/dL — AB (ref >39)
LDL Calculated: 92 mg/dL (ref 0–99)
Triglycerides: 340 mg/dL — ABNORMAL HIGH (ref 0–149)
VLDL Cholesterol Cal: 68 mg/dL — ABNORMAL HIGH (ref 5–40)

## 2016-01-20 LAB — HGB A1C W/O EAG: Hgb A1c MFr Bld: 6 % — ABNORMAL HIGH (ref 4.8–5.6)

## 2016-02-23 ENCOUNTER — Encounter: Payer: Self-pay | Admitting: Physician Assistant

## 2016-02-23 ENCOUNTER — Ambulatory Visit: Payer: Self-pay | Admitting: Physician Assistant

## 2016-02-23 VITALS — BP 120/80 | HR 86 | Temp 97.8°F

## 2016-02-23 DIAGNOSIS — Z0289 Encounter for other administrative examinations: Secondary | ICD-10-CM

## 2016-02-23 NOTE — Progress Notes (Signed)
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

## 2016-06-05 ENCOUNTER — Emergency Department: Payer: Managed Care, Other (non HMO)

## 2016-06-05 ENCOUNTER — Emergency Department
Admission: EM | Admit: 2016-06-05 | Discharge: 2016-06-06 | Disposition: A | Discharge status: Home / Self Care | Payer: Managed Care, Other (non HMO) | Attending: Student in an Organized Health Care Education/Training Program | Admitting: Student in an Organized Health Care Education/Training Program

## 2016-06-05 ENCOUNTER — Encounter: Payer: Self-pay | Admitting: Medical Oncology

## 2016-06-05 DIAGNOSIS — R55 Syncope and collapse: Secondary | ICD-10-CM | POA: Diagnosis present

## 2016-06-05 DIAGNOSIS — Z79899 Other long term (current) drug therapy: Secondary | ICD-10-CM | POA: Insufficient documentation

## 2016-06-05 DIAGNOSIS — E162 Hypoglycemia, unspecified: Secondary | ICD-10-CM | POA: Diagnosis not present

## 2016-06-05 LAB — CBC WITH DIFFERENTIAL/PLATELET
BASOS PCT: 1 %
Basophils Absolute: 0 10*3/uL (ref 0–0.1)
EOS ABS: 0.2 10*3/uL (ref 0–0.7)
EOS PCT: 2 %
HCT: 45.4 % (ref 40.0–52.0)
Hemoglobin: 15.7 g/dL (ref 13.0–18.0)
Lymphocytes Relative: 28 %
Lymphs Abs: 2.2 10*3/uL (ref 1.0–3.6)
MCH: 29.7 pg (ref 26.0–34.0)
MCHC: 34.5 g/dL (ref 32.0–36.0)
MCV: 86.1 fL (ref 80.0–100.0)
MONO ABS: 0.5 10*3/uL (ref 0.2–1.0)
MONOS PCT: 6 %
NEUTROS PCT: 63 %
Neutro Abs: 4.8 10*3/uL (ref 1.4–6.5)
Platelets: 287 10*3/uL (ref 150–440)
RBC: 5.27 MIL/uL (ref 4.40–5.90)
RDW: 13.7 % (ref 11.5–14.5)
WBC: 7.7 10*3/uL (ref 3.8–10.6)

## 2016-06-05 LAB — URINALYSIS COMPLETE WITH MICROSCOPIC (ARMC ONLY)
BILIRUBIN URINE: NEGATIVE
Bacteria, UA: NONE SEEN
GLUCOSE, UA: 50 mg/dL — AB
Hgb urine dipstick: NEGATIVE
KETONES UR: NEGATIVE mg/dL
Leukocytes, UA: NEGATIVE
NITRITE: NEGATIVE
PH: 5 (ref 5.0–8.0)
Protein, ur: NEGATIVE mg/dL
RBC / HPF: NONE SEEN RBC/hpf (ref 0–5)
Specific Gravity, Urine: 1.017 (ref 1.005–1.030)
Squamous Epithelial / LPF: NONE SEEN
WBC UA: NONE SEEN WBC/hpf (ref 0–5)

## 2016-06-05 LAB — URINE DRUG SCREEN, QUALITATIVE (ARMC ONLY)
AMPHETAMINES, UR SCREEN: POSITIVE — AB
BENZODIAZEPINE, UR SCRN: NOT DETECTED
Barbiturates, Ur Screen: NOT DETECTED
COCAINE METABOLITE, UR ~~LOC~~: NOT DETECTED
Cannabinoid 50 Ng, Ur ~~LOC~~: NOT DETECTED
MDMA (Ecstasy)Ur Screen: NOT DETECTED
METHADONE SCREEN, URINE: NOT DETECTED
OPIATE, UR SCREEN: NOT DETECTED
PHENCYCLIDINE (PCP) UR S: NOT DETECTED
Tricyclic, Ur Screen: NOT DETECTED

## 2016-06-05 LAB — COMPREHENSIVE METABOLIC PANEL
ALBUMIN: 4.3 g/dL (ref 3.5–5.0)
ALT: 37 U/L (ref 17–63)
ANION GAP: 8 (ref 5–15)
AST: 25 U/L (ref 15–41)
Alkaline Phosphatase: 97 U/L (ref 38–126)
BUN: 15 mg/dL (ref 6–20)
CO2: 26 mmol/L (ref 22–32)
Calcium: 8.8 mg/dL — ABNORMAL LOW (ref 8.9–10.3)
Chloride: 104 mmol/L (ref 101–111)
Creatinine, Ser: 1.03 mg/dL (ref 0.61–1.24)
GFR calc non Af Amer: >60 mL/min (ref >60)
GLUCOSE: 188 mg/dL — AB (ref 65–99)
POTASSIUM: 3.7 mmol/L (ref 3.5–5.1)
SODIUM: 138 mmol/L (ref 135–145)
TOTAL PROTEIN: 7.7 g/dL (ref 6.5–8.1)
Total Bilirubin: 0.2 mg/dL — ABNORMAL LOW (ref 0.3–1.2)

## 2016-06-05 LAB — GLUCOSE, CAPILLARY
GLUCOSE-CAPILLARY: 103 mg/dL — AB (ref 65–99)
GLUCOSE-CAPILLARY: 106 mg/dL — AB (ref 65–99)
Glucose-Capillary: 138 mg/dL — ABNORMAL HIGH (ref 65–99)
Glucose-Capillary: 108 mg/dL — ABNORMAL HIGH (ref 65–99)

## 2016-06-05 LAB — TROPONIN I

## 2016-06-05 LAB — LIPASE, BLOOD: Lipase: 27 U/L (ref 11–51)

## 2016-06-05 MED ORDER — PROMETHAZINE HCL 25 MG/ML IJ SOLN
12.5000 mg | Freq: Once | INTRAMUSCULAR | Status: AC
  Administered 2016-06-05: 12.5 mg via INTRAVENOUS
  Filled 2016-06-05: qty 1

## 2016-06-05 MED ORDER — DIPHENHYDRAMINE HCL 50 MG/ML IJ SOLN
25.0000 mg | Freq: Once | INTRAMUSCULAR | Status: AC
  Administered 2016-06-05: 25 mg via INTRAVENOUS
  Filled 2016-06-05: qty 1

## 2016-06-05 MED ORDER — IOPAMIDOL (ISOVUE-370) INJECTION 76%
75.0000 mL | Freq: Once | INTRAVENOUS | Status: AC | PRN
  Administered 2016-06-05: 75 mL via INTRAVENOUS

## 2016-06-05 MED ORDER — SODIUM CHLORIDE 0.9 % IV BOLUS (SEPSIS)
1000.0000 mL | Freq: Once | INTRAVENOUS | Status: AC
  Administered 2016-06-05: 1000 mL via INTRAVENOUS

## 2016-06-05 NOTE — ED Notes (Signed)
With permission from the patient I was able to speak with his fiancee who spoke with Dr. Quentin Cornwall. She was able to provide additional information regarding patient's past medical history.
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
## 2016-06-05 NOTE — ED Provider Notes (Signed)
[facility] Emergency Department Provider Note    None    (approximate)  I have reviewed the triage vital signs and the nursing notes.   HISTORY  Chief Complaint No chief complaint on file.    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 Patient was otherwise acting normal today. Stated that he ate M&Ms this morning.  Patient was seen roughly 15 minutes prior by his coworkers as he was being told to go get something to eat. They didn't seem for 10 minutes went and found him and he is laying unresponsive. They checked his glucose is 24. He is given D50. Blood sugar increased to 240. Patient did not demonstrate any seizure-like activity. Upon arrival to the ER he weighs with spontaneous respirations and moving all extremity but acutely encephalopathic and drowsy. The patient did come around after roughly 3 minutes of observation. Denies any chest pain, shortness of breath, nausea or vomiting. Denies any history of diabetes. Denies any new medications. Denies a headache. No visual disturbance. States he just feels tired.   Past Medical History:  Diagnosis Date  . AVN (avascular necrosis of bone) ([place])    r femural head  . Pneumonia     Patient Active Problem List   Diagnosis Date Noted  . 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 COLONOSCOPY N/A 04/01/2015   Procedure: COLONOSCOPY;  Surgeon: [name], MD;  Location: [place];  Service: Gastroenterology;  Laterality: N/A;  . GANGLION CYST EXCISION    . POLYPECTOMY  04/01/2015   Procedure: POLYPECTOMY;  Surgeon: [name], MD;  Location: [place];  Service: Gastroenterology;;  . TONGUE FLAP RELEASE    . TOTAL HIP ARTHROPLASTY  10/07/2011   Procedure: TOTAL HIP ARTHROPLASTY;  Surgeon: Kerin Salen, MD;  Location: [place];  Service: Orthopedics;   Laterality: Right;    Prior to Admission medications   Medication Sig Start Date End Date Taking? Authorizing Provider  albuterol (PROVENTIL HFA;VENTOLIN HFA) 108 (90 Base) MCG/ACT inhaler Inhale 2 puffs into the lungs every 6 (six) hours as needed for wheezing or shortness of breath. 12/15/15   Versie Starks, PA-C  amphetamine-dextroamphetamine (ADDERALL XR) 25 MG 24 hr capsule Take 25 mg by mouth every morning.    Historical Provider, MD    Allergies Review of patient's allergies indicates no known allergies.  No family history on file.  Social History Social History  Substance Use Topics  . Smoking status: Never Smoker  . Smokeless tobacco: Never Used  . Alcohol use No    Review of Systems Patient denies headaches, rhinorrhea, blurry vision, numbness, shortness of breath, chest pain, edema, cough, abdominal pain, nausea, vomiting, diarrhea, dysuria, fevers, rashes or hallucinations unless otherwise stated above in HPI. ____________________________________________   PHYSICAL EXAM:  VITAL SIGNS: Vitals:   06/05/16 2200 06/05/16 2230  BP: (!) 133/117   Pulse: (!) 103 (!) 101  Resp:  16  Temp:      Constitutional: Alert and oriented. Well appearing and in no acute distress. Eyes: Conjunctivae are normal. PERRL. EOMI. Head: Atraumatic. Nose: No congestion/rhinnorhea. Mouth/Throat: Mucous membranes are moist.  Oropharynx non-erythematous. Neck: No stridor. Painless ROM. No cervical spine tenderness to palpation Hematological/Lymphatic/Immunilogical: No cervical lymphadenopathy. Cardiovascular: Normal rate, regular rhythm. Grossly normal heart sounds.  Good peripheral circulation. Respiratory: Normal respiratory effort.  No retractions. Lungs CTAB. Gastrointestinal: Soft and nontender. No distention. No abdominal bruits. No CVA tenderness. Genitourinary:  Musculoskeletal: No lower extremity tenderness nor edema.  No joint effusions. Neurologic:  Normal speech and  language. No gross focal neurologic deficits are appreciated. No gait instability. Skin:  Skin is warm, dry and intact. No rash noted. Psychiatric: Mood and affect are normal. Speech and behavior are normal.  ____________________________________________   LABS (all labs ordered are listed, but only abnormal results are displayed)  Results for orders placed or performed during the hospital encounter of 06/05/16 (from the past 24 hour(s))  CBC with Differential/Platelet     Status: None   Collection Time: 06/05/16  6:48 PM  Result Value Ref Range   WBC 7.7 3.8 - 10.6 K/uL   RBC 5.27 4.40 - 5.90 MIL/uL   Hemoglobin 15.7 13.0 - 18.0 g/dL   HCT 45.4 40.0 - 52.0 %   MCV 86.1 80.0 - 100.0 fL   MCH 29.7 26.0 - 34.0 pg   MCHC 34.5 32.0 - 36.0 g/dL   RDW 13.7 11.5 - 14.5 %   Platelets 287 150 - 440 K/uL   Neutrophils Relative % 63 %   Neutro Abs 4.8 1.4 - 6.5 K/uL   Lymphocytes Relative 28 %   Lymphs Abs 2.2 1.0 - 3.6 K/uL   Monocytes Relative 6 %   Monocytes Absolute 0.5 0.2 - 1.0 K/uL   Eosinophils Relative 2 %   Eosinophils Absolute 0.2 0 - 0.7 K/uL   Basophils Relative 1 %   Basophils Absolute 0.0 0 - 0.1 K/uL  Comprehensive metabolic panel     Status: Abnormal   Collection Time: 06/05/16  6:48 PM  Result Value Ref Range   Sodium 138 135 - 145 mmol/L   Potassium 3.7 3.5 - 5.1 mmol/L   Chloride 104 101 - 111 mmol/L   CO2 26 22 - 32 mmol/L   Glucose, Bld 188 (H) 65 - 99 mg/dL   BUN 15 6 - 20 mg/dL   Creatinine, Ser 1.03 0.61 - 1.24 mg/dL   Calcium 8.8 (L) 8.9 - 10.3 mg/dL   Total Protein 7.7 6.5 - 8.1 g/dL   Albumin 4.3 3.5 - 5.0 g/dL   AST 25 15 - 41 U/L   ALT 37 17 - 63 U/L   Alkaline Phosphatase 97 38 - 126 U/L   Total Bilirubin 0.2 (L) 0.3 - 1.2 mg/dL   GFR calc non Af Amer >60 >60 mL/min   GFR calc Af Amer >60 >60 mL/min   Anion gap 8 5 - 15  Lipase, blood     Status: None   Collection Time: 06/05/16  6:48 PM  Result Value Ref Range   Lipase 27 11 - 51 U/L    Troponin I     Status: None   Collection Time: 06/05/16  6:48 PM  Result Value Ref Range   Troponin I <0.03 <0.03 ng/mL  Glucose, capillary     Status: Abnormal   Collection Time: 06/05/16  7:01 PM  Result Value Ref Range   Glucose-Capillary 138 (H) 65 - 99 mg/dL  Glucose, capillary     Status: Abnormal   Collection Time: 06/05/16  7:59 PM  Result Value Ref Range   Glucose-Capillary 108 (H) 65 - 99 mg/dL  Urinalysis complete, with microscopic ([facility] only)     Status: Abnormal   Collection Time: 06/05/16  8:23 PM  Result Value Ref Range   Color, Urine YELLOW (A) YELLOW  APPearance CLEAR (A) CLEAR   Glucose, UA 50 (A) NEGATIVE mg/dL   Bilirubin Urine NEGATIVE NEGATIVE   Ketones, ur NEGATIVE NEGATIVE mg/dL   Specific Gravity, Urine 1.017 1.005 - 1.030   Hgb urine dipstick NEGATIVE NEGATIVE   pH 5.0 5.0 - 8.0   Protein, ur NEGATIVE NEGATIVE mg/dL   Nitrite NEGATIVE NEGATIVE   Leukocytes, UA NEGATIVE NEGATIVE   RBC / HPF NONE SEEN 0 - 5 RBC/hpf   WBC, UA NONE SEEN 0 - 5 WBC/hpf   Bacteria, UA NONE SEEN NONE SEEN   Squamous Epithelial / LPF NONE SEEN NONE SEEN   Mucous PRESENT    Hyaline Casts, UA PRESENT   Urine Drug Screen, Qualitative ([facility] only)     Status: Abnormal   Collection Time: 06/05/16  8:23 PM  Result Value Ref Range   Tricyclic, Ur Screen NONE DETECTED NONE DETECTED   Amphetamines, Ur Screen POSITIVE (A) NONE DETECTED   MDMA (Ecstasy)Ur Screen NONE DETECTED NONE DETECTED   Cocaine Metabolite,Ur [place] NONE DETECTED NONE DETECTED   Opiate, Ur Screen NONE DETECTED NONE DETECTED   Phencyclidine (PCP) Ur S NONE DETECTED NONE DETECTED   Cannabinoid 50 Ng, Ur [place] NONE DETECTED NONE DETECTED   Barbiturates, Ur Screen NONE DETECTED NONE DETECTED   Benzodiazepine, Ur Scrn NONE DETECTED NONE DETECTED   Methadone Scn, Ur NONE DETECTED NONE DETECTED  Glucose, capillary     Status: Abnormal   Collection Time: 06/05/16 10:29 PM  Result Value Ref Range   Glucose-Capillary 103  (H) 65 - 99 mg/dL  Troponin I     Status: None   Collection Time: 06/05/16 10:50 PM  Result Value Ref Range   Troponin I <0.03 <0.03 ng/mL  Glucose, capillary     Status: Abnormal   Collection Time: 06/05/16 11:55 PM  Result Value Ref Range   Glucose-Capillary 106 (H) 65 - 99 mg/dL   ____________________________________________  EKG My review and personal interpretation at Time: 18:41   Indication: syncope  Rate: 100  Rhythm: sinus Axis: normal Other: normal intervals, no acute ischemia ____________________________________________  RADIOLOGY See chart for details ____________________________________________   PROCEDURES  Procedure(s) performed: none    Critical Care performed: yes CRITICAL CARE Performed by: Merlyn Lot   Total critical care time: 35 minutes  Critical care time was exclusive of separately billable procedures and treating other patients.  Critical care was necessary to treat or prevent imminent or life-threatening deterioration.  Critical care was time spent personally by me on the following activities: development of treatment plan with patient and/or surrogate as well as nursing, discussions with consultants, evaluation of patient's response to treatment, examination of patient, obtaining history from patient or surrogate, ordering and performing treatments and interventions, ordering and review of laboratory studies, ordering and review of radiographic studies, pulse oximetry and re-evaluation of patient's condition.  ____________________________________________   INITIAL IMPRESSION / ASSESSMENT AND PLAN / ED COURSE  Pertinent labs & imaging results that were available during my care of the patient were reviewed by me and considered in my medical decision making (see chart for details).  DDX: hypoglycemia, dehydration, acs, dysrhythmia, seizure, sah, sdh, iph   [name] is a 45 y.o. who presents to the ED critically ill with acute  encephalopathy, unresponsive and hypoglycemia. Patient markedly altered upon arrival to the ER but was protecting his airway. Repeat blood sugar after D50 was 200 but patient without significant improvement in his symptoms. At this point I am concerned for other underlying  pathology including cardiac event versus CVA. Patient is not a diabetic and therefore would not expect his blood sugar to drop so low without other expiration. The patient will be placed on continuous pulse oximetry and telemetry for monitoring.  Laboratory evaluation will be sent to evaluate for the above complaints.     Clinical Course  Comment By Time  Patient last dove one week prior.  Denies any numbness or tingling at this time.  No recent exposures.  No new medications.  States he still feels weak and fatigued.  Patient having trouble keeping his eyes open.  Merlyn Lot, MD 09/17 [id number]  Repeat exam:  Patient MAE.  No facial droop.  EOMI. No focal deficits.  Patient states he feels tried and weak. Merlyn Lot, MD 09/17 2005  Based on the patient's acute encephalopathy and failure to improve despite normal glucose and normal head CT, and still concern for possible brainstem infarct possibly affecting RAS.  We'll order an MRI brain to evaluate. Merlyn Lot, MD 09/17 2015  Patient now acting at baseline. Requesting DC home. Merlyn Lot, MD 09/17 2216  Patient was able to tolerate PO and was able to ambulate with a steady gait. Merlyn Lot, MD 09/17 2238  Patient's repeat troponin is negative for ischemia. Patient is up walking about the ER and a symptomatically at this time. I discussed with strong recommendation for admission to hospital and relayed my concerns to the patient. He demonstrates understanding of the risks associated with discharge from the hospital without complete evaluation including worsening of his condition and death.  The patient demonstrates understanding of these risks and is able to  communicate that he still wishes to be discharged home for outpatient workup.   Merlyn Lot, MD 09/17 2356     ____________________________________________   FINAL CLINICAL IMPRESSION(S) / ED DIAGNOSES  Final diagnoses:  Hypoglycemia  Syncope and collapse      NEW MEDICATIONS STARTED DURING THIS VISIT:  New Prescriptions   No medications on file     Note:  This document was prepared using Dragon voice recognition software and may include unintentional dictation errors.    Merlyn Lot, MD 06/06/16 0000

## 2016-06-05 NOTE — ED Notes (Signed)
With permission from patient, information relayed to fiancee via telephone.

## 2016-06-07 ENCOUNTER — Other Ambulatory Visit: Payer: Self-pay

## 2016-06-07 DIAGNOSIS — Z299 Encounter for prophylactic measures, unspecified: Secondary | ICD-10-CM

## 2016-06-07 LAB — INSULIN AND C-PEPTIDE, SERUM
C PEPTIDE: 6.4 ng/mL — AB (ref 1.1–4.4)
INSULIN: 46.3 u[IU]/mL — AB (ref 2.6–24.9)

## 2016-06-07 NOTE — Progress Notes (Signed)
Patient came in to have blood drawn for testing for his upcoming appointment with Dr. Doy Hutching at [facility].

## 2016-06-08 LAB — HGB A1C W/O EAG: HEMOGLOBIN A1C: 5.7 % — AB (ref 4.8–5.6)

## 2016-08-16 ENCOUNTER — Ambulatory Visit: Payer: Self-pay | Admitting: Physician Assistant

## 2016-08-16 ENCOUNTER — Encounter: Payer: Self-pay | Admitting: Physician Assistant

## 2016-08-16 VITALS — BP 140/80 | HR 102 | Temp 98.5°F

## 2016-08-16 DIAGNOSIS — R109 Unspecified abdominal pain: Secondary | ICD-10-CM

## 2016-08-16 LAB — POCT URINALYSIS DIPSTICK
BILIRUBIN UA: NEGATIVE
GLUCOSE UA: NEGATIVE
KETONES UA: NEGATIVE
LEUKOCYTES UA: NEGATIVE
NITRITE UA: NEGATIVE
PH UA: 5.5
Spec Grav, UA: >=1.03
Urobilinogen, UA: 0.2

## 2016-08-16 MED ORDER — KETOROLAC TROMETHAMINE 60 MG/2ML IM SOLN
60.0000 mg | Freq: Once | INTRAMUSCULAR | Status: AC
  Administered 2016-08-16: 60 mg via INTRAMUSCULAR

## 2016-08-16 MED ORDER — KETOROLAC TROMETHAMINE 10 MG PO TABS: 10.0000 mg | ORAL_TABLET | Freq: Four times a day (QID) | ORAL | 0 refills | Status: DC | PRN

## 2016-08-16 NOTE — Addendum Note (Signed)
Addended by: Rudene Anda T on: 08/16/2016 02:22 PM   Modules accepted: Orders

## 2016-08-16 NOTE — Progress Notes (Signed)
S: c/o r sided flank pain, similar to when he had kidney stones, ran out of toradol pills, ?if he could get a refill, no fever/chills, no cp/sob, no v/d  O: vitals wnl, nad, no cva tendernesss, a little tender at area in r flank under rib, lungs c t a, cv rrr  A: acute flank pain with hx of kidney stones  P: toradol 60mg  IM, toradol 10mg  #20 nr

## 2016-11-08 ENCOUNTER — Encounter: Payer: Self-pay | Admitting: Physician Assistant

## 2016-11-08 ENCOUNTER — Ambulatory Visit: Payer: Self-pay | Admitting: Physician Assistant

## 2016-11-08 VITALS — BP 121/79 | HR 105 | Temp 97.8°F

## 2016-11-08 DIAGNOSIS — F5102 Adjustment insomnia: Secondary | ICD-10-CM

## 2016-11-08 DIAGNOSIS — T7491XA Unspecified adult maltreatment, confirmed, initial encounter: Secondary | ICD-10-CM

## 2016-11-08 DIAGNOSIS — F4310 Post-traumatic stress disorder, unspecified: Secondary | ICD-10-CM

## 2016-11-08 MED ORDER — TRAZODONE HCL 150 MG PO TABS: 150.0000 mg | ORAL_TABLET | Freq: Every evening | ORAL | 6 refills | Status: DC | PRN
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
## 2016-11-08 NOTE — Progress Notes (Signed)
121/79         

## 2016-11-15 ENCOUNTER — Ambulatory Visit: Payer: Self-pay | Admitting: Physician Assistant

## 2016-11-15 DIAGNOSIS — F419 Anxiety disorder, unspecified: Secondary | ICD-10-CM

## 2016-11-15 DIAGNOSIS — F4310 Post-traumatic stress disorder, unspecified: Secondary | ICD-10-CM

## 2016-11-15 DIAGNOSIS — T7491XD Unspecified adult maltreatment, confirmed, subsequent encounter: Secondary | ICD-10-CM
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
## 2016-12-28 ENCOUNTER — Encounter: Payer: Self-pay | Admitting: *Deleted

## 2016-12-28 ENCOUNTER — Emergency Department: Payer: Managed Care, Other (non HMO)

## 2016-12-28 ENCOUNTER — Emergency Department
Admission: EM | Admit: 2016-12-28 | Discharge: 2016-12-28 | Disposition: A | Discharge status: Home / Self Care | Payer: Managed Care, Other (non HMO) | Attending: Emergency Medicine | Admitting: Emergency Medicine

## 2016-12-28 DIAGNOSIS — R4182 Altered mental status, unspecified: Secondary | ICD-10-CM | POA: Insufficient documentation

## 2016-12-28 DIAGNOSIS — Z79899 Other long term (current) drug therapy: Secondary | ICD-10-CM | POA: Insufficient documentation

## 2016-12-28 LAB — ETHANOL

## 2016-12-28 LAB — COMPREHENSIVE METABOLIC PANEL
ALK PHOS: 102 U/L (ref 38–126)
ALT: 38 U/L (ref 17–63)
AST: 28 U/L (ref 15–41)
Albumin: 4 g/dL (ref 3.5–5.0)
Anion gap: 7 (ref 5–15)
BILIRUBIN TOTAL: 0.6 mg/dL (ref 0.3–1.2)
BUN: 12 mg/dL (ref 6–20)
CALCIUM: 9.2 mg/dL (ref 8.9–10.3)
CHLORIDE: 104 mmol/L (ref 101–111)
CO2: 28 mmol/L (ref 22–32)
CREATININE: 0.98 mg/dL (ref 0.61–1.24)
Glucose, Bld: 135 mg/dL — ABNORMAL HIGH (ref 65–99)
Potassium: 3.3 mmol/L — ABNORMAL LOW (ref 3.5–5.1)
Sodium: 139 mmol/L (ref 135–145)
Total Protein: 7 g/dL (ref 6.5–8.1)

## 2016-12-28 LAB — URINE DRUG SCREEN, QUALITATIVE (ARMC ONLY)
AMPHETAMINES, UR SCREEN: NOT DETECTED
BENZODIAZEPINE, UR SCRN: NOT DETECTED
Barbiturates, Ur Screen: NOT DETECTED
Cannabinoid 50 Ng, Ur ~~LOC~~: NOT DETECTED
Cocaine Metabolite,Ur ~~LOC~~: NOT DETECTED
MDMA (ECSTASY) UR SCREEN: NOT DETECTED
METHADONE SCREEN, URINE: NOT DETECTED
Opiate, Ur Screen: NOT DETECTED
Phencyclidine (PCP) Ur S: NOT DETECTED
TRICYCLIC, UR SCREEN: NOT DETECTED

## 2016-12-28 LAB — URINALYSIS, COMPLETE (UACMP) WITH MICROSCOPIC
Bacteria, UA: NONE SEEN
Bilirubin Urine: NEGATIVE
Glucose, UA: NEGATIVE mg/dL
HGB URINE DIPSTICK: NEGATIVE
Ketones, ur: NEGATIVE mg/dL
Leukocytes, UA: NEGATIVE
Nitrite: NEGATIVE
Protein, ur: NEGATIVE mg/dL
Specific Gravity, Urine: 1.012 (ref 1.005–1.030)
pH: 6 (ref 5.0–8.0)

## 2016-12-28 LAB — CBC WITH DIFFERENTIAL/PLATELET
BASOS ABS: 0 10*3/uL (ref 0–0.1)
Basophils Relative: 1 %
Eosinophils Absolute: 0.1 10*3/uL (ref 0–0.7)
Eosinophils Relative: 1 %
HEMATOCRIT: 43.9 % (ref 40.0–52.0)
HEMOGLOBIN: 14.9 g/dL (ref 13.0–18.0)
LYMPHS ABS: 1.5 10*3/uL (ref 1.0–3.6)
LYMPHS PCT: 19 %
MCH: 29.3 pg (ref 26.0–34.0)
MCHC: 33.9 g/dL (ref 32.0–36.0)
MCV: 86.4 fL (ref 80.0–100.0)
Monocytes Absolute: 0.5 10*3/uL (ref 0.2–1.0)
Monocytes Relative: 6 %
NEUTROS ABS: 5.6 10*3/uL (ref 1.4–6.5)
Neutrophils Relative %: 73 %
Platelets: 290 10*3/uL (ref 150–440)
RBC: 5.09 MIL/uL (ref 4.40–5.90)
RDW: 13.9 % (ref 11.5–14.5)
WBC: 7.6 10*3/uL (ref 3.8–10.6)

## 2016-12-28 LAB — GLUCOSE, CAPILLARY: GLUCOSE-CAPILLARY: 155 mg/dL — AB (ref 65–99)

## 2016-12-28 LAB — TROPONIN I

## 2016-12-28 MED ORDER — SODIUM CHLORIDE 0.9 % IV SOLN
1000.0000 mL | Freq: Once | INTRAVENOUS | Status: AC
  Administered 2016-12-28: 1000 mL via INTRAVENOUS

## 2016-12-28 MED ORDER — DIPHENHYDRAMINE HCL 25 MG PO CAPS
25.0000 mg | ORAL_CAPSULE | Freq: Once | ORAL | Status: AC
  Administered 2016-12-28: 25 mg via ORAL
  Filled 2016-12-28: qty 1
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
## 2016-12-28 NOTE — ED Notes (Signed)
Pt left ED at 1700. RN was unable to removed pt from board at this time.

## 2016-12-28 NOTE — ED Notes (Signed)
Pt asked for urine specimen, given urinal.

## 2016-12-28 NOTE — ED Triage Notes (Signed)
Pt brought in by EMS, found by family outside, pt will open eyes, track with eyes, pupils equal and reactive, face symmetrical, blood sugar 155, VSS, pink in color, pt alert, non verbal at this time, pt's baseline is alert and oriented

## 2016-12-28 NOTE — ED Notes (Signed)
Pt in CT.

## 2016-12-28 NOTE — ED Notes (Signed)
Pt ambulatory at time of d/c and denies dizziness or lightheadedness. Girlfriend with pt at time of discharge and ambulated with pt to lobby. IVs removed without difficulty and pressure applied until bleeding stopped.

## 2016-12-28 NOTE — ED Notes (Signed)
Pt woke up and explained to RN he had pressure in the right side of his head, pt reports

## 2016-12-28 NOTE — ED Provider Notes (Signed)
[facility] Emergency Department Provider Note       Time seen: ----------------------------------------- 2:24 PM on 12/28/2016 -----------------------------------------  L5 caveat: Review of systems and history is limited by altered mental status.   I have reviewed the triage vital signs and the nursing notes.   HISTORY   Chief Complaint No chief complaint on file.    HPI [name] is a 46 y.o. male who presents to the ED for altered mental status. Patient reportedly was at his grandmother's house and she found him unresponsive in the yard. He will not follow commands. He tracks with his eyes and appears to be tearful. Patient's situation and context is otherwise unknown at this time.   Past Medical History:  Diagnosis Date  . 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 Pneumonia     Patient Active Problem List   Diagnosis Date Noted  . 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 COLONOSCOPY N/A 04/01/2015   Procedure: COLONOSCOPY;  Surgeon: [name], MD;  Location: [place];  Service: Gastroenterology;  Laterality: N/A;  . GANGLION CYST EXCISION    . POLYPECTOMY  04/01/2015   Procedure: POLYPECTOMY;  Surgeon: [name], MD;  Location: [place];  Service: Gastroenterology;;  . TONGUE FLAP RELEASE    . TOTAL HIP ARTHROPLASTY  10/07/2011   Procedure: TOTAL HIP ARTHROPLASTY;  Surgeon: Kerin Salen, MD;  Location: [place];  Service: Orthopedics;  Laterality: Right;    Allergies Patient has no known allergies.  Social History Social History  Substance Use Topics  . Smoking status: Never Smoker  . Smokeless tobacco: Never Used  . Alcohol use No    Review of Systems Unknown at this time  ____________________________________________   PHYSICAL EXAM:  VITAL SIGNS: ED Triage Vitals  Enc Vitals Group     BP      Pulse       Resp      Temp      Temp src      SpO2      Weight      Height      Head Circumference      Peak Flow      Pain Score      Pain Loc      Pain Edu?      Excl. in [place]?     Constitutional: Alert,  No distress Eyes: Conjunctivae are normal. Normal extraocular movements. Patient tracks with his eyes, is tearful ENT   Head: Normocephalic and atraumatic.   Nose: No congestion/rhinnorhea.   Mouth/Throat: Mucous membranes are moist.   Neck: No stridor. Cardiovascular: Normal rate, regular rhythm. No murmurs, rubs, or gallops. Respiratory: Normal respiratory effort without tachypnea nor retractions. Breath sounds are clear and equal bilaterally. No wheezes/rales/rhonchi. Gastrointestinal: Soft and nontender. Normal bowel sounds Musculoskeletal:  No lower extremity tenderness nor edema. Neurologic: No gross focal neurologic deficits are appreciated.  Skin:  Skin is warm, dry and intact. No rash noted. Psychiatric: Patient is tearful ____________________________________________  EKG: Interpreted by me. Sinus rhythm rate 89 bpm, normal PR interval, normal QRS, normal QT, normal axis.  ____________________________________________  ED COURSE:  Pertinent labs & imaging results that were available during my care of the patient were reviewed by me and considered in my medical decision making (see chart for details). Patient presents for altered mental status, we will assess  with labs and imaging as indicated.   Procedures ____________________________________________   LABS (pertinent positives/negatives)  Labs Reviewed  COMPREHENSIVE METABOLIC PANEL - Abnormal; Notable for the following:       Result Value   Potassium 3.3 (*)    Glucose, Bld 135 (*)    All other components within normal limits  GLUCOSE, CAPILLARY - Abnormal; Notable for the following:    Glucose-Capillary 155 (*)    All other components within normal limits  CBC WITH DIFFERENTIAL/PLATELET  TROPONIN I   ETHANOL  URINALYSIS, COMPLETE (UACMP) WITH MICROSCOPIC  URINE DRUG SCREEN, QUALITATIVE ([facility] ONLY)    RADIOLOGY Images were viewed by me  CT head, chest x-ray IMPRESSION: No acute intracranial process. Borderline parenchymal brain volume loss for age. ____________________________________________  FINAL ASSESSMENT AND PLAN  Altered mental status  Plan: Patient's labs and imaging were dictated above. Patient had presented for altered mental status of uncertain etiology. Everything has resolved to this point. Workup here has been negative. There does not appear to be an identifiable medical reason for this altered mental status. He is stable for outpatient follow-up.   [name], MD   Note: This note was generated in part or whole with voice recognition software. Voice recognition is usually quite accurate but there are transcription errors that can and very often do occur. I apologize for any typographical errors that were not detected and corrected.     [name], MD 12/28/16 [phone]

## 2016-12-28 NOTE — ED Notes (Addendum)
Pt called out requesting to have grandmother and girlfriend at bedside. Girlfriend made aware and at bedside at this time. Pt requesting to have all family leave after this.

## 2017-01-13 ENCOUNTER — Other Ambulatory Visit: Payer: Self-pay

## 2017-01-13 VITALS — BP 110/80 | HR 96 | Ht 74.0 in | Wt 251.0 lb

## 2017-01-13 DIAGNOSIS — Z0189 Encounter for other specified special examinations: Secondary | ICD-10-CM

## 2017-01-13 DIAGNOSIS — F411 Generalized anxiety disorder: Secondary | ICD-10-CM

## 2017-01-13 DIAGNOSIS — Z008 Encounter for other general examination: Secondary | ICD-10-CM

## 2017-01-13 MED ORDER — TRAZODONE HCL 150 MG PO TABS: ORAL_TABLET | ORAL | 6 refills | Status: DC
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
## 2017-01-14 LAB — CMP12+LP+TP+TSH+6AC+PSA+CBC…
ALBUMIN: 4.2 g/dL (ref 3.5–5.5)
ALT: 29 IU/L (ref 0–44)
AST: 20 IU/L (ref 0–40)
Albumin/Globulin Ratio: 1.6 (ref 1.2–2.2)
Alkaline Phosphatase: 119 IU/L — ABNORMAL HIGH (ref 39–117)
BILIRUBIN TOTAL: 0.3 mg/dL (ref 0.0–1.2)
BUN/Creatinine Ratio: 15 (ref 9–20)
BUN: 15 mg/dL (ref 6–24)
Basophils Absolute: 0 10*3/uL (ref 0.0–0.2)
Basos: 0 %
CHLORIDE: 103 mmol/L (ref 96–106)
CHOLESTEROL TOTAL: 196 mg/dL (ref 100–199)
CREATININE: 1.02 mg/dL (ref 0.76–1.27)
Calcium: 9.4 mg/dL (ref 8.7–10.2)
Chol/HDL Ratio: 5.9 ratio — ABNORMAL HIGH (ref 0.0–5.0)
EOS (ABSOLUTE): 0.1 10*3/uL (ref 0.0–0.4)
Eos: 1 %
Estimated CHD Risk: 1.3 times avg. — ABNORMAL HIGH (ref 0.0–1.0)
FREE THYROXINE INDEX: 2.3 (ref 1.2–4.9)
GFR calc Af Amer: 101 mL/min/{1.73_m2} (ref >59)
GFR, EST NON AFRICAN AMERICAN: 87 mL/min/{1.73_m2} (ref >59)
GGT: 54 IU/L (ref 0–65)
Globulin, Total: 2.6 g/dL (ref 1.5–4.5)
Glucose: 95 mg/dL (ref 65–99)
HDL: 33 mg/dL — ABNORMAL LOW (ref >39)
Hematocrit: 45.6 % (ref 37.5–51.0)
Hemoglobin: 15 g/dL (ref 13.0–17.7)
IMMATURE GRANS (ABS): 0 10*3/uL (ref 0.0–0.1)
IMMATURE GRANULOCYTES: 0 %
IRON: 50 ug/dL (ref 38–169)
LDH: 177 IU/L (ref 121–224)
LDL Calculated: 116 mg/dL — ABNORMAL HIGH (ref 0–99)
LYMPHS ABS: 1.7 10*3/uL (ref 0.7–3.1)
Lymphs: 25 %
MCH: 28.8 pg (ref 26.6–33.0)
MCHC: 32.9 g/dL (ref 31.5–35.7)
MCV: 88 fL (ref 79–97)
Monocytes Absolute: 0.5 10*3/uL (ref 0.1–0.9)
Monocytes: 6 %
NEUTROS ABS: 4.7 10*3/uL (ref 1.4–7.0)
Neutrophils: 68 %
PHOSPHORUS: 2.7 mg/dL (ref 2.5–4.5)
PLATELETS: 337 10*3/uL (ref 150–379)
POTASSIUM: 4.2 mmol/L (ref 3.5–5.2)
Prostate Specific Ag, Serum: 0.5 ng/mL (ref 0.0–4.0)
RBC: 5.2 x10E6/uL (ref 4.14–5.80)
RDW: 14.3 % (ref 12.3–15.4)
SODIUM: 142 mmol/L (ref 134–144)
T3 UPTAKE RATIO: 24 % (ref 24–39)
T4 TOTAL: 9.7 ug/dL (ref 4.5–12.0)
TOTAL PROTEIN: 6.8 g/dL (ref 6.0–8.5)
TSH: 1.73 u[IU]/mL (ref 0.450–4.500)
Triglycerides: 235 mg/dL — ABNORMAL HIGH (ref 0–149)
Uric Acid: 8.5 mg/dL (ref 3.7–8.6)
VLDL CHOLESTEROL CAL: 47 mg/dL — AB (ref 5–40)
WBC: 7 10*3/uL (ref 3.4–10.8)

## 2017-01-14 LAB — VITAMIN D 25 HYDROXY (VIT D DEFICIENCY, FRACTURES): Vit D, 25-Hydroxy: 33.7 ng/mL (ref 30.0–100.0)

## 2017-01-20 ENCOUNTER — Encounter: Payer: Self-pay | Admitting: Physician Assistant

## 2017-01-20 ENCOUNTER — Ambulatory Visit: Payer: Self-pay | Admitting: Physician Assistant

## 2017-01-20 VITALS — BP 119/80 | HR 85 | Temp 97.9°F

## 2017-01-20 DIAGNOSIS — Z0000 Encounter for general adult medical examination without abnormal findings: Secondary | ICD-10-CM

## 2017-01-20 DIAGNOSIS — J209 Acute bronchitis, unspecified: Secondary | ICD-10-CM

## 2017-01-20 MED ORDER — LEVOFLOXACIN 500 MG PO TABS: 500.0000 mg | ORAL_TABLET | Freq: Every day | ORAL | 0 refills | Status: DC

## 2017-01-20 MED ORDER — PREDNISONE 10 MG (48) PO TBPK: ORAL_TABLET | ORAL | 0 refills | Status: DC

## 2017-01-20 MED ORDER — IPRATROPIUM-ALBUTEROL 0.5-2.5 (3) MG/3ML IN SOLN
3.0000 mL | Freq: Once | RESPIRATORY_TRACT | Status: AC
  Administered 2017-01-20: 3 mL via RESPIRATORY_TRACT

## 2017-01-20 NOTE — Progress Notes (Signed)
S: C/o cough and congestion with wheezing and chest pain, chest is sore from coughing, denies fever, chills, + cough is dry and hacking; is wheezing a lot, using inhaler but still feels tight; keeping pt awake at night;  denies cardiac type chest pain or sob, v/d, abd pain, also ?if we could do his physical for insurance, already had biometrics the other day Remainder ros neg  O: vitals wnl, nad, tms clear, throat injected, neck supple no lymph, lungs with wheezing, cv rrr, neuro intact, svn duoneb given in clinic, improved air movement  A:  Acute bronchitis, wellness exam  P:  rx medication: levaquin 500mg  qd x 7d, stereapred, tussionex 133ml nr, use otc meds, tylenol or motrin as needed for fever/chills, return if not better in 3 -5 days, return earlier if worsening

## 2017-01-21 ENCOUNTER — Emergency Department
Admission: EM | Admit: 2017-01-21 | Discharge: 2017-01-21 | Disposition: A | Discharge status: Home / Self Care | Payer: Managed Care, Other (non HMO) | Attending: Emergency Medicine | Admitting: Emergency Medicine

## 2017-01-21 ENCOUNTER — Encounter: Payer: Self-pay | Admitting: Emergency Medicine

## 2017-01-21 ENCOUNTER — Emergency Department: Payer: Managed Care, Other (non HMO)

## 2017-01-21 DIAGNOSIS — J40 Bronchitis, not specified as acute or chronic: Secondary | ICD-10-CM | POA: Diagnosis not present

## 2017-01-21 DIAGNOSIS — R0602 Shortness of breath: Secondary | ICD-10-CM

## 2017-01-21 LAB — BLOOD GAS, VENOUS
ACID-BASE EXCESS: 3.6 mmol/L — AB (ref 0.0–2.0)
BICARBONATE: 29.7 mmol/L — AB (ref 20.0–28.0)
O2 Saturation: 71.5 %
PCO2 VEN: 49 mmHg (ref 44.0–60.0)
PH VEN: 7.39 (ref 7.250–7.430)
PO2 VEN: 38 mmHg (ref 32.0–45.0)
Patient temperature: 37

## 2017-01-21 LAB — COMPREHENSIVE METABOLIC PANEL
ALBUMIN: 3.7 g/dL (ref 3.5–5.0)
ALK PHOS: 93 U/L (ref 38–126)
ALT: 27 U/L (ref 17–63)
ANION GAP: 10 (ref 5–15)
AST: 26 U/L (ref 15–41)
BUN: 15 mg/dL (ref 6–20)
CHLORIDE: 105 mmol/L (ref 101–111)
CO2: 25 mmol/L (ref 22–32)
Calcium: 8.7 mg/dL — ABNORMAL LOW (ref 8.9–10.3)
Creatinine, Ser: 1.08 mg/dL (ref 0.61–1.24)
GFR calc Af Amer: >60 mL/min (ref >60)
GFR calc non Af Amer: >60 mL/min (ref >60)
Glucose, Bld: 141 mg/dL — ABNORMAL HIGH (ref 65–99)
Potassium: 3.2 mmol/L — ABNORMAL LOW (ref 3.5–5.1)
SODIUM: 140 mmol/L (ref 135–145)
Total Bilirubin: 0.5 mg/dL (ref 0.3–1.2)
Total Protein: 6.7 g/dL (ref 6.5–8.1)

## 2017-01-21 LAB — CBC WITH DIFFERENTIAL/PLATELET
Basophils Absolute: 0 10*3/uL (ref 0–0.1)
Basophils Relative: 0 %
EOS ABS: 0 10*3/uL (ref 0–0.7)
Eosinophils Relative: 0 %
HEMATOCRIT: 42.8 % (ref 40.0–52.0)
Hemoglobin: 14.5 g/dL (ref 13.0–18.0)
LYMPHS ABS: 1.6 10*3/uL (ref 1.0–3.6)
Lymphocytes Relative: 19 %
MCH: 29.2 pg (ref 26.0–34.0)
MCHC: 33.8 g/dL (ref 32.0–36.0)
MCV: 86.4 fL (ref 80.0–100.0)
MONOS PCT: 10 %
Monocytes Absolute: 0.9 10*3/uL (ref 0.2–1.0)
NEUTROS ABS: 6.1 10*3/uL (ref 1.4–6.5)
NEUTROS PCT: 71 %
Platelets: 275 10*3/uL (ref 150–440)
RBC: 4.96 MIL/uL (ref 4.40–5.90)
RDW: 14.2 % (ref 11.5–14.5)
WBC: 8.6 10*3/uL (ref 3.8–10.6)

## 2017-01-21 LAB — TROPONIN I: Troponin I: <0.03 ng/mL (ref <0.03)

## 2017-01-21 MED ORDER — LEVALBUTEROL HCL 1.25 MG/3ML IN NEBU
1.2500 mg | INHALATION_SOLUTION | Freq: Once | RESPIRATORY_TRACT | Status: DC
  Filled 2017-01-21: qty 3

## 2017-01-21 MED ORDER — IPRATROPIUM-ALBUTEROL 0.5-2.5 (3) MG/3ML IN SOLN
RESPIRATORY_TRACT | Status: AC
  Filled 2017-01-21: qty 6

## 2017-01-21 MED ORDER — ALBUTEROL SULFATE (2.5 MG/3ML) 0.083% IN NEBU
INHALATION_SOLUTION | RESPIRATORY_TRACT | Status: AC
  Filled 2017-01-21: qty 6

## 2017-01-21 MED ORDER — IPRATROPIUM-ALBUTEROL 0.5-2.5 (3) MG/3ML IN SOLN
3.0000 mL | Freq: Once | RESPIRATORY_TRACT | Status: AC
  Administered 2017-01-21: 3 mL via RESPIRATORY_TRACT

## 2017-01-21 MED ORDER — LEVALBUTEROL HCL 1.25 MG/0.5ML IN NEBU
INHALATION_SOLUTION | RESPIRATORY_TRACT | Status: AC
  Administered 2017-01-21: 1.25 mg
  Filled 2017-01-21: qty 0.5

## 2017-01-21 MED ORDER — METHYLPREDNISOLONE SODIUM SUCC 125 MG IJ SOLR
125.0000 mg | Freq: Once | INTRAMUSCULAR | Status: AC
  Administered 2017-01-21: 125 mg via INTRAVENOUS
  Filled 2017-01-21: qty 2

## 2017-01-21 MED ORDER — ALBUTEROL SULFATE (2.5 MG/3ML) 0.083% IN NEBU: 2.5000 mg | INHALATION_SOLUTION | Freq: Four times a day (QID) | RESPIRATORY_TRACT | 0 refills | Status: DC | PRN

## 2017-01-21 MED ORDER — LEVALBUTEROL HCL 1.25 MG/0.5ML IN NEBU: 1.2500 mg | INHALATION_SOLUTION | RESPIRATORY_TRACT | 0 refills | Status: DC | PRN

## 2017-01-21 MED ORDER — ALBUTEROL SULFATE (2.5 MG/3ML) 0.083% IN NEBU
5.0000 mg | INHALATION_SOLUTION | Freq: Once | RESPIRATORY_TRACT | Status: AC
  Administered 2017-01-21: 5 mg via RESPIRATORY_TRACT

## 2017-01-21 MED ORDER — IPRATROPIUM-ALBUTEROL 0.5-2.5 (3) MG/3ML IN SOLN
3.0000 mL | Freq: Once | RESPIRATORY_TRACT | Status: AC
  Administered 2017-01-21: 3 mL via RESPIRATORY_TRACT
  Filled 2017-01-21: qty 3

## 2017-01-21 MED ORDER — MAGNESIUM SULFATE 2 GM/50ML IV SOLN
2.0000 g | Freq: Once | INTRAVENOUS | Status: AC
  Administered 2017-01-21: 2 g via INTRAVENOUS
  Filled 2017-01-21: qty 50

## 2017-01-21 NOTE — ED Triage Notes (Signed)
Pt ambulatory to treatment room, report seen by PCP for SOB, put on amoxicillin and prednisone 4 days ago, got rechecked following day and o2sat 87% on RA, was given levaquin and breathing treatments.  Today comes to ed w/ c/o continued sob.  Pt also report cough with green sputum.

## 2017-01-21 NOTE — ED Notes (Signed)
Pt taken to xray 

## 2017-01-21 NOTE — Discharge Instructions (Addendum)
Return to the emergency room immediately for any worsening trouble breathing, shortness breath, chest pain, dizziness passing out, or altered mental status.  May use nebulizer albuterol every 4 hours as needed for wheezing and shortness of breath. Please continue your antibiotic as well as steroid prednisone.

## 2017-01-21 NOTE — ED Provider Notes (Signed)
[facility]  I accepted care from Dr. Dahlia Client. ____________________________________________    LABS (pertinent positives/negatives)  Labs Reviewed  COMPREHENSIVE METABOLIC PANEL - Abnormal; Notable for the following:       Result Value   Potassium 3.2 (*)    Glucose, Bld 141 (*)    Calcium 8.7 (*)    All other components within normal limits  BLOOD GAS, VENOUS - Abnormal; Notable for the following:    Bicarbonate 29.7 (*)    Acid-Base Excess 3.6 (*)    All other components within normal limits  CBC WITH DIFFERENTIAL/PLATELET  TROPONIN I     _________________________________________   INITIAL IMPRESSION / ASSESSMENT AND PLAN / ED COURSE   Pertinent labs & imaging results that were available during my care of the patient were reviewed by me and considered in my medical decision making (see chart for details).  I accepted care from Dr. Dahlia Client. Patient with bronchitis, some persistent wheezing after a DuoNeb and albuterol as well as magnesium. Watching her period of observation.  On reexamination, patient still had fairly significant wheezing limiting him to only speaking a few words. We discussed failed outpatient therapy versus trying a dose of Xopenex. Patient would really like to go home with a nebulizer treatment if at all possible.  He is an EMT, and has a nursing home. Excellent improved air movement after Xopenex treatment.  Able to speak in full sentences.  Dr. Dahlia Client and a prescription for albuterol and nebulizer machine. I will give prescription for Xopenex since it seemed to work so well.  ----------------------------------------- 10:52 AM on 01/21/2017 -----------------------------------------   END OF OBSERVATION STATUS: After an appropriate period of observation, this patient is being discharged due to the following reason(s):  Improved breathing.   CONSULTATIONS: None   Patient / Family / Caregiver informed of clinical course,  medical decision-making process, and agree with plan.   I discussed return precautions, follow-up instructions, and discharged instructions with patient and/or family.     ____________________________________________   FINAL CLINICAL IMPRESSION(S) / ED DIAGNOSES  Final diagnoses:  Bronchitis  Shortness of breath        Lisa Roca, MD 01/21/17 1053

## 2017-01-21 NOTE — ED Provider Notes (Signed)
[facility] Emergency Department Provider Note   ____________________________________________   First MD Initiated Contact with Patient 01/21/17 [phone]     (approximate)  I have reviewed the triage vital signs and the nursing notes.   HISTORY  Chief Complaint Shortness of Breath    HPI Nole Robey Dillenburg is a 46 y.o. male who comes into the hospital today with shortness of breath. The patient reports that he has been sick with a cold for the past week. He reports that he has been taking prednisone and amoxicillin. He also reports that he's been taking mucinex for the past 5 days. The patient saw his doctor yesterday and was prescribed more prednisone, Tussionex and levofloxacin. The patient reports though that this morning he is unable to breathe. He does have an inhaler but reports that it has not been helping his shortness of breath. The patient reports that he has been taking all of his medicines without any difficulty. He does not have a history of COPD. Patient denies any fevers and denies any chest pain. No nausea no vomiting. He is here today for treatment and evaluation. The patient's cough is productive of yellow sputum.   Past Medical History:  Diagnosis Date  . AVN (avascular necrosis of bone) ([place])    r femural head  . Pneumonia     Patient Active Problem List   Diagnosis Date Noted  . Hyperlipemia, mixed 10/22/2015  . Adult ADHD 07/17/2015  . Kidney stones 07/17/2015  . 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 COLONOSCOPY N/A 04/01/2015   Procedure: COLONOSCOPY;  Surgeon: [name], MD;  Location: [place];  Service: Gastroenterology;  Laterality: N/A;  . GANGLION CYST EXCISION    . POLYPECTOMY  04/01/2015   Procedure: POLYPECTOMY;  Surgeon: [name], MD;  Location: [place];  Service: Gastroenterology;;  . TONGUE FLAP RELEASE    . TOTAL HIP  ARTHROPLASTY  10/07/2011   Procedure: TOTAL HIP ARTHROPLASTY;  Surgeon: Kerin Salen, MD;  Location: [place];  Service: Orthopedics;  Laterality: Right;    Prior to Admission medications   Medication Sig Start Date End Date Taking? Authorizing Provider  albuterol (PROVENTIL HFA;VENTOLIN HFA) 108 (90 Base) MCG/ACT inhaler Inhale 2 puffs into the lungs every 6 (six) hours as needed for wheezing or shortness of breath. 12/15/15   Caryn Section Linden Dolin, PA-C  amphetamine-dextroamphetamine (ADDERALL XR) 25 MG 24 hr capsule Take 25 mg by mouth every morning.    [provider]  ketorolac (TORADOL) 10 MG tablet Take 1 tablet (10 mg total) by mouth every 6 (six) hours as needed. Patient not taking: Reported on 12/28/2016 08/16/16   Versie Starks, PA-C  levofloxacin (LEVAQUIN) 500 MG tablet Take 1 tablet (500 mg total) by mouth daily. 01/20/17   Caryn Section Linden Dolin, PA-C  predniSONE (STERAPRED UNI-PAK 48 TAB) 10 MG (48) TBPK tablet Use as directed 01/20/17   Versie Starks, PA-C  traZODone (DESYREL) 150 MG tablet Take one or 2 a qhs 01/13/17   Caryn Section Linden Dolin, PA-C    Allergies Patient has no known allergies.  Family History  Problem Relation Age of Onset  . Cancer Mother   . Cancer Sister   . Colon cancer Sister     Social History Social History  Substance Use Topics  . Smoking status: Never Smoker  . Smokeless tobacco: Never Used  . Alcohol use No  Review of Systems  Constitutional: No fever/chills Eyes: No visual changes. ENT: No sore throat. Cardiovascular: Denies chest pain. Respiratory: Cough and shortness of breath. Gastrointestinal: No abdominal pain.  No nausea, no vomiting.  No diarrhea.  No constipation. Genitourinary: Negative for dysuria. Musculoskeletal: Negative for back pain. Skin: Negative for rash. Neurological: Negative for headaches, focal weakness or numbness.   ____________________________________________   PHYSICAL EXAM:  VITAL SIGNS: ED Triage Vitals    Enc Vitals Group     BP 01/21/17 0630 (!) 153/77     Pulse Rate 01/21/17 0630 77     Resp 01/21/17 0630 (!) 24     Temp 01/21/17 0630 97.8 F (36.6 C)     Temp Source 01/21/17 0630 Oral     SpO2 01/21/17 0630 96 %     Weight 01/21/17 0627 250 lb (113.4 kg)     Height 01/21/17 0627 6\' 2"  (1.88 m)     Head Circumference --      Peak Flow --      Pain Score 01/21/17 0627 0     Pain Loc --      Pain Edu? --      Excl. in [place]? --     Constitutional: Alert and oriented. Well appearing and in moderate respiratory distress. Eyes: Conjunctivae are normal. PERRL. EOMI. Head: Atraumatic. Nose: No congestion/rhinnorhea. Mouth/Throat: Mucous membranes are moist.  Oropharynx non-erythematous. Cardiovascular: Normal rate, regular rhythm. Grossly normal heart sounds.  Good peripheral circulation. Respiratory: Normal respiratory effort.  No retractions. Expiratory wheezes in bases and mid lung fields. Gastrointestinal: Soft and nontender. No distention. Positive bowel sounds Musculoskeletal: No lower extremity tenderness nor edema.   Neurologic:  Normal speech and language.  Skin:  Skin is warm, dry and intact.  Psychiatric: Mood and affect are normal.   ____________________________________________   LABS (all labs ordered are listed, but only abnormal results are displayed)  Labs Reviewed  COMPREHENSIVE METABOLIC PANEL - Abnormal; Notable for the following:       Result Value   Potassium 3.2 (*)    Glucose, Bld 141 (*)    Calcium 8.7 (*)    All other components within normal limits  CBC WITH DIFFERENTIAL/PLATELET  TROPONIN I  BLOOD GAS, VENOUS   ____________________________________________  EKG  ED ECG REPORT I, Loney Hering, the attending physician, personally viewed and interpreted this ECG.   Date: 01/21/2017  EKG Time: 0633  Rate: 71  Rhythm: normal sinus rhythm  Axis: normal  Intervals:none  ST&T Change:  none  ____________________________________________  RADIOLOGY  CXR ____________________________________________   PROCEDURES  Procedure(s) performed: None  Procedures  Critical Care performed: No  ____________________________________________   INITIAL IMPRESSION / ASSESSMENT AND PLAN / ED COURSE  Pertinent labs & imaging results that were available during my care of the patient were reviewed by me and considered in my medical decision making (see chart for details).  This is a 46 year old male who comes into the hospital today with some shortness of breath. The patient has been sick with a cough for over a week and he has been on multiple courses of antibiotics. The patient reports that he just could not breathe this evening. The patient does have wheezes on exam I will give him some Solu-Medrol as well as 3 duo nebs. I will also give the patient some magnesium sulfate. I will reassess the patient once I receive his blood work.  Clinical Course as of Jan 22 832  Sat Jan 21, 2017  8546 No active cardiopulmonary disease. DG Chest 2 View [AW]    Clinical Course User Index [AW] Loney Hering, MD    ----------------------------------------- 8:27 AM on 01/21/2017 -----------------------------------------   OBSERVATION CARE: This patient is being placed under observation care for the following reasons: The patient has bronchitis and is requiring multiple nebs.  ----------------------------------------- 8:31 AM on 01/21/2017 -----------------------------------------  The patient had initially received 3 DuoNeb's, mag and Solu-Medrol. I did go back into his room and reassess the patient. While his breathing is improved he still has some significant wheezing now and all lung fields. I will give the patient 2 more albuterol nebs and his care will be signed out to Dr. Reita Cliche who will continue to monitor the patient while he is in the emergency  department.  ____________________________________________   FINAL CLINICAL IMPRESSION(S) / ED DIAGNOSES  Final diagnoses:  Bronchitis  Shortness of breath      NEW MEDICATIONS STARTED DURING THIS VISIT:  New Prescriptions   No medications on file     Note:  This document was prepared using Dragon voice recognition software and may include unintentional dictation errors.    Loney Hering, MD 01/21/17 [phone]

## 2017-02-07 ENCOUNTER — Emergency Department: Payer: Managed Care, Other (non HMO)

## 2017-02-07 ENCOUNTER — Emergency Department
Admission: EM | Admit: 2017-02-07 | Discharge: 2017-02-08 | Disposition: A | Discharge status: Home / Self Care | Payer: Managed Care, Other (non HMO) | Attending: Student in an Organized Health Care Education/Training Program | Admitting: Student in an Organized Health Care Education/Training Program

## 2017-02-07 DIAGNOSIS — Z79899 Other long term (current) drug therapy: Secondary | ICD-10-CM | POA: Insufficient documentation

## 2017-02-07 DIAGNOSIS — Y9389 Activity, other specified: Secondary | ICD-10-CM | POA: Diagnosis not present

## 2017-02-07 DIAGNOSIS — S79912A Unspecified injury of left hip, initial encounter: Secondary | ICD-10-CM | POA: Diagnosis present

## 2017-02-07 DIAGNOSIS — Y92007 Garden or yard of unspecified non-institutional (private) residence as the place of occurrence of the external cause: Secondary | ICD-10-CM | POA: Insufficient documentation

## 2017-02-07 DIAGNOSIS — Y999 Unspecified external cause status: Secondary | ICD-10-CM | POA: Insufficient documentation

## 2017-02-07 DIAGNOSIS — W010XXA Fall on same level from slipping, tripping and stumbling without subsequent striking against object, initial encounter: Secondary | ICD-10-CM | POA: Insufficient documentation

## 2017-02-07 DIAGNOSIS — M25552 Pain in left hip: Secondary | ICD-10-CM | POA: Diagnosis not present

## 2017-02-07 MED ORDER — FENTANYL CITRATE (PF) 100 MCG/2ML IJ SOLN
100.0000 ug | INTRAMUSCULAR | Status: DC | PRN
  Administered 2017-02-07: 100 ug via INTRAVENOUS
  Filled 2017-02-07: qty 2

## 2017-02-07 MED ORDER — OXYCODONE-ACETAMINOPHEN 5-325 MG PO TABS
1.0000 | ORAL_TABLET | Freq: Once | ORAL | Status: AC
  Administered 2017-02-08: 1 via ORAL
  Filled 2017-02-07: qty 1

## 2017-02-07 MED ORDER — KETAMINE HCL 10 MG/ML IJ SOLN
25.0000 mg | Freq: Once | INTRAMUSCULAR | Status: AC
  Administered 2017-02-08: 25 mg via INTRAVENOUS

## 2017-02-07 MED ORDER — LIDOCAINE 5 % EX PTCH
1.0000 | MEDICATED_PATCH | CUTANEOUS | Status: DC
  Administered 2017-02-08: 1 via TRANSDERMAL
  Filled 2017-02-07: qty 1

## 2017-02-07 MED ORDER — SODIUM CHLORIDE 0.9 % IV BOLUS (SEPSIS)
1000.0000 mL | Freq: Once | INTRAVENOUS | Status: AC
  Administered 2017-02-07: 1000 mL via INTRAVENOUS

## 2017-02-07 MED ORDER — HYDROMORPHONE HCL 1 MG/ML IJ SOLN
1.0000 mg | INTRAMUSCULAR | Status: DC | PRN
  Administered 2017-02-07: 1 mg via INTRAVENOUS
  Filled 2017-02-07: qty 1

## 2017-02-07 MED ORDER — KETOROLAC TROMETHAMINE 30 MG/ML IJ SOLN
15.0000 mg | Freq: Once | INTRAMUSCULAR | Status: AC
  Administered 2017-02-07: 15 mg via INTRAVENOUS
  Filled 2017-02-07: qty 1

## 2017-02-07 NOTE — ED Provider Notes (Signed)
[facility] Emergency Department Provider Note    None    (approximate)  I have reviewed the triage vital signs and the nursing notes.   HISTORY  Chief Complaint Hip Pain    HPI [name] is a 46 y.o. male with a history of avascular necrosis of the right femoral head presents with 2 days of acute left hip pain and difficulty ambulating secondary to pain. Patient states that he fell while working out in the yard after tripping on the [place] leave landing on the left hip. States it is otherwise doing fairly well and then over the past day has become more sore resulting in having difficulty sleeping and difficulty ambulating. States it hurts "10 times worse" than when he had his right hip replaced.  No recent fevers.  No back pain. No numbness or tingling. No pain shooting down his leg.   Past Medical History:  Diagnosis Date  . AVN (avascular necrosis of bone) ([place])    r femural head  . Pneumonia    Family History  Problem Relation Age of Onset  . Cancer Mother   . Cancer Sister   . Colon cancer Sister    Past Surgical History:  Procedure Laterality Date  . COLONOSCOPY N/A 04/01/2015   Procedure: COLONOSCOPY;  Surgeon: [name], MD;  Location: [place];  Service: Gastroenterology;  Laterality: N/A;  . GANGLION CYST EXCISION    . POLYPECTOMY  04/01/2015   Procedure: POLYPECTOMY;  Surgeon: [name], MD;  Location: [place];  Service: Gastroenterology;;  . TONGUE FLAP RELEASE    . TOTAL HIP ARTHROPLASTY  10/07/2011   Procedure: TOTAL HIP ARTHROPLASTY;  Surgeon: Kerin Salen, MD;  Location: [place];  Service: Orthopedics;  Laterality: Right;   Patient Active Problem List   Diagnosis Date Noted  . Hyperlipemia, mixed 10/22/2015  . Adult ADHD 07/17/2015  . Kidney stones 07/17/2015  . 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 Authorizing Provider  albuterol (PROVENTIL HFA;VENTOLIN HFA) 108 (90 Base) MCG/ACT inhaler Inhale 2 puffs into the lungs every 6 (six) hours as needed for wheezing or shortness of breath. Patient not taking: Reported on 01/21/2017 12/15/15   Versie Starks, PA-C  albuterol (PROVENTIL) (2.5 MG/3ML) 0.083% nebulizer solution Take 3 mLs (2.5 mg total) by nebulization every 6 (six) hours as needed for wheezing or shortness of breath. 01/21/17   Loney Hering, MD  chlorpheniramine-HYDROcodone [facility] PENNKINETIC ER) 10-8 MG/5ML SUER Take 5 mLs by mouth every 12 (twelve) hours as needed for cough.    [provider]  cyclobenzaprine (FLEXERIL) 5 MG tablet Take 1 tablet (5 mg total) by mouth 3 (three) times daily as needed for muscle spasms. 02/08/17   Merlyn Lot, MD  ketorolac (TORADOL) 10 MG tablet Take 1 tablet (10 mg total) by mouth every 6 (six) hours as needed. Patient not taking: Reported on 12/28/2016 08/16/16   Versie Starks, PA-C  levalbuterol Penne Lash) 1.25 MG/0.5ML nebulizer solution Take 1.25 mg by nebulization every 4 (four) hours as needed for wheezing or shortness of breath. 01/21/17 01/21/18  Lisa Roca, MD  levofloxacin (LEVAQUIN) 500 MG tablet Take 1 tablet (500 mg total) by mouth daily. 01/20/17   Fisher, Linden Dolin, PA-C  oxyCODONE-acetaminophen (PERCOCET) 10-325 MG tablet Take 1 tablet by mouth every 6 (six) hours as needed for pain. 02/08/17 02/08/18  Merlyn Lot, MD  predniSONE [facility] UNI-PAK 48 TAB) 10 MG (48) TBPK tablet Use as directed 01/20/17   Versie Starks, PA-C  traZODone (DESYREL) 150 MG tablet Take one or 2 a qhs 01/13/17   Caryn Section Linden Dolin, PA-C    Allergies Patient has no known allergies.    Social History Social History  Substance Use Topics  . Smoking status: Never Smoker  . Smokeless tobacco: Never Used  . Alcohol use No    Review of Systems Patient denies headaches, rhinorrhea, blurry vision, numbness, shortness of breath,  chest pain, edema, cough, abdominal pain, nausea, vomiting, diarrhea, dysuria, fevers, rashes or hallucinations unless otherwise stated above in HPI. ____________________________________________   PHYSICAL EXAM:  VITAL SIGNS: Vitals:   02/07/17 2130 02/07/17 2330  BP: (!) 150/98 (!) 134/96  Pulse: (!) 124 96  Resp: 20   Temp: 98.5 F (36.9 C)     Constitutional: Alert and oriented. Uncomfortable appearing Eyes: Conjunctivae are normal. PERRL. EOMI. Head: Atraumatic. Nose: No congestion/rhinnorhea. Mouth/Throat: Mucous membranes are moist.  Oropharynx non-erythematous. Neck: No stridor. Painless ROM. No cervical spine tenderness to palpation Hematological/Lymphatic/Immunilogical: No cervical lymphadenopathy. Cardiovascular: Normal rate, regular rhythm. Grossly normal heart sounds.  Good peripheral circulation. Respiratory: Normal respiratory effort.  No retractions. Lungs CTAB. Gastrointestinal: Soft and nontender. No distention. No abdominal bruits. No CVA tenderness. Musculoskeletal: Pain with internal and external rotation of left hip. Pain with flexion. No overlying erythema or warmth. No palpable aneurysm or mass. No leg shortening. Patient was able to ambulate with a limp. Neurologic:  Normal speech and language. No gross focal neurologic deficits are appreciated. No gait instability. Skin:  Skin is warm, dry and intact. No rash noted. Psychiatric: Mood and affect are normal. Speech and behavior are normal.  ____________________________________________   LABS (all labs ordered are listed, but only abnormal results are displayed)  Results for orders placed or performed during the hospital encounter of 02/07/17 (from the past 24 hour(s))  CBC with Differential/Platelet     Status: Abnormal   Collection Time: 02/07/17 11:29 PM  Result Value Ref Range   WBC 9.1 3.8 - 10.6 K/uL   RBC 4.59 4.40 - 5.90 MIL/uL   Hemoglobin 13.5 13.0 - 18.0 g/dL   HCT 39.5 (L) 40.0 - 52.0 %    MCV 86.0 80.0 - 100.0 fL   MCH 29.5 26.0 - 34.0 pg   MCHC 34.3 32.0 - 36.0 g/dL   RDW 14.6 (H) 11.5 - 14.5 %   Platelets 265 150 - 440 K/uL   Neutrophils Relative % 68 %   Neutro Abs 6.2 1.4 - 6.5 K/uL   Lymphocytes Relative 23 %   Lymphs Abs 2.1 1.0 - 3.6 K/uL   Monocytes Relative 7 %   Monocytes Absolute 0.7 0.2 - 1.0 K/uL   Eosinophils Relative 2 %   Eosinophils Absolute 0.1 0 - 0.7 K/uL   Basophils Relative 0 %   Basophils Absolute 0.0 0 - 0.1 K/uL   ____________________________________________  EKG____________________________________________  RADIOLOGY  I personally reviewed all radiographic images ordered to evaluate for the above acute complaints and reviewed radiology reports and findings.  These findings were personally discussed with the patient.  Please see medical record for radiology report.  ____________________________________________   PROCEDURES  Procedure(s) performed:  Procedures    Critical Care performed: no ____________________________________________   INITIAL IMPRESSION / ASSESSMENT AND PLAN / ED COURSE  Pertinent labs & imaging results that were available during my care of the patient were  reviewed by me and considered in my medical decision making (see chart for details).  DDX: fracutre, arthitis, dislocation, contusion  [name] is a 46 y.o. who presents to the ED with  Acute left hip injury. Denies any other injuries. Denies head injury or neck pain/injury. Denies motor or sensory loss in extremity. VSS in ED. Exam as above. NV intact throughout and distal to injury. 2+ distal pulses. Pt able to range joint, with some pain; also TTP at hip joint. No clinical suspicion for infectious process or septic joint. Getting X-rays to r/o fracture. No other injuries reported or noted on exam.  X-rays with no fracture.  Based on patient's exam and history of a vascular necrosis CT imaging ordered to further characterize.  CT imaging shows  subchondral cyst and arthritis but no acute fracture. At this point if the patient is stable for close follow-up with his orthopedic surgeon.  Have discussed with the patient and available family all diagnostics and treatments performed thus far and all questions were answered to the best of my ability. The patient demonstrates understanding and agreement with plan.        ____________________________________________   FINAL CLINICAL IMPRESSION(S) / ED DIAGNOSES  Final diagnoses:  Left hip pain      NEW MEDICATIONS STARTED DURING THIS VISIT:  New Prescriptions   CYCLOBENZAPRINE (FLEXERIL) 5 MG TABLET    Take 1 tablet (5 mg total) by mouth 3 (three) times daily as needed for muscle spasms.   OXYCODONE-ACETAMINOPHEN (PERCOCET) 10-325 MG TABLET    Take 1 tablet by mouth every 6 (six) hours as needed for pain.     Note:  This document was prepared using Dragon voice recognition software and may include unintentional dictation errors.    Merlyn Lot, MD 02/08/17 [phone]

## 2017-02-07 NOTE — ED Triage Notes (Signed)
Pt states that he fell and hurt his L hip tonight.  Pt states he has history of hip pain.  Pt refusing wheelchair at this time.  Pt A&Ox4.  Pt walking with cane at this time and ambulatory to triage.

## 2017-02-07 NOTE — ED Notes (Signed)
Patient transported to CT 

## 2017-02-08 LAB — CBC WITH DIFFERENTIAL/PLATELET
BASOS PCT: 0 %
Basophils Absolute: 0 10*3/uL (ref 0–0.1)
EOS ABS: 0.1 10*3/uL (ref 0–0.7)
EOS PCT: 2 %
HCT: 39.5 % — ABNORMAL LOW (ref 40.0–52.0)
Hemoglobin: 13.5 g/dL (ref 13.0–18.0)
Lymphocytes Relative: 23 %
Lymphs Abs: 2.1 10*3/uL (ref 1.0–3.6)
MCH: 29.5 pg (ref 26.0–34.0)
MCHC: 34.3 g/dL (ref 32.0–36.0)
MCV: 86 fL (ref 80.0–100.0)
MONO ABS: 0.7 10*3/uL (ref 0.2–1.0)
MONOS PCT: 7 %
NEUTROS PCT: 68 %
Neutro Abs: 6.2 10*3/uL (ref 1.4–6.5)
PLATELETS: 265 10*3/uL (ref 150–440)
RBC: 4.59 MIL/uL (ref 4.40–5.90)
RDW: 14.6 % — AB (ref 11.5–14.5)
WBC: 9.1 10*3/uL (ref 3.8–10.6)

## 2017-02-08 LAB — BASIC METABOLIC PANEL
Anion gap: 6 (ref 5–15)
BUN: 15 mg/dL (ref 6–20)
CALCIUM: 8.8 mg/dL — AB (ref 8.9–10.3)
CHLORIDE: 107 mmol/L (ref 101–111)
CO2: 27 mmol/L (ref 22–32)
CREATININE: 1.13 mg/dL (ref 0.61–1.24)
GFR calc Af Amer: >60 mL/min (ref >60)
Glucose, Bld: 103 mg/dL — ABNORMAL HIGH (ref 65–99)
Potassium: 3.6 mmol/L (ref 3.5–5.1)
SODIUM: 140 mmol/L (ref 135–145)

## 2017-02-08 LAB — GLUCOSE, CAPILLARY: GLUCOSE-CAPILLARY: 127 mg/dL — AB (ref 65–99)

## 2017-02-08 MED ORDER — ONDANSETRON HCL 4 MG/2ML IJ SOLN
INTRAMUSCULAR | Status: AC
  Administered 2017-02-08: 4 mg
  Filled 2017-02-08: qty 2

## 2017-02-08 MED ORDER — CYCLOBENZAPRINE HCL 5 MG PO TABS: 5.0000 mg | ORAL_TABLET | Freq: Three times a day (TID) | ORAL | 0 refills | Status: DC | PRN

## 2017-02-08 MED ORDER — OXYCODONE-ACETAMINOPHEN 10-325 MG PO TABS: 1.0000 | ORAL_TABLET | Freq: Four times a day (QID) | ORAL | 0 refills | Status: DC | PRN

## 2017-02-08 MED ORDER — KETAMINE HCL 10 MG/ML IJ SOLN
INTRAMUSCULAR | Status: AC
  Administered 2017-02-08: 25 mg via INTRAVENOUS
  Filled 2017-02-08: qty 1

## 2017-02-08 NOTE — Discharge Instructions (Signed)
Call Dr. Mayer Camel in the AM to schedule appointment.

## 2017-02-08 NOTE — ED Notes (Signed)
Pt states he feels better and would like to be discharged, Dr Dahlia Client notified

## 2017-02-14 DIAGNOSIS — S72002A Fracture of unspecified part of neck of left femur, initial encounter for closed fracture: Secondary | ICD-10-CM | POA: Diagnosis present

## 2017-02-15 ENCOUNTER — Ambulatory Visit: Payer: Self-pay | Admitting: Physician Assistant

## 2017-02-15 NOTE — H&P (Signed)
TOTAL HIP ADMISSION H&P  Patient is admitted for left total hip arthroplasty.  Subjective:  Chief Complaint: left hip pain  HPI: [name], 46 y.o. male, has a history of pain and functional disability in the left hip(s) due to trauma and patient has failed non-surgical conservative treatments for greater than 12 weeks to include use of assistive devices and activity modification.  Onset of symptoms was abrupt starting 1 week years ago with rapidlly worsening course since that time.The patient noted no past surgery on the left hip(s).  Patient currently rates pain in the left hip at 10 out of 10 with activity. Patient has night pain, worsening of pain with activity and weight bearing, trendelenberg gait, pain that interfers with activities of daily living and pain with passive range of motion. Patient has evidence of hip fracture by imaging studies. This condition presents safety issues increasing the risk of falls. This patient has had proximal femur fracture.  There is no current active infection.  Patient Active Problem List   Diagnosis Date Noted  . Fracture of femoral neck, left ([place]) 02/14/2017  . Hyperlipemia, mixed 10/22/2015  . Adult ADHD 07/17/2015  . Kidney stones 07/17/2015  . Avascular necrosis of hip Right with collapse 10/07/2011   Past Medical History:  Diagnosis Date  . AVN (avascular necrosis of bone) ([place])    r femural head  . Pneumonia     Past Surgical History:  Procedure Laterality Date  . COLONOSCOPY N/A 04/01/2015   Procedure: COLONOSCOPY;  Surgeon: [name], MD;  Location: [place];  Service: Gastroenterology;  Laterality: N/A;  . GANGLION CYST EXCISION    . POLYPECTOMY  04/01/2015   Procedure: POLYPECTOMY;  Surgeon: [name], MD;  Location: [place];  Service: Gastroenterology;;  . TONGUE FLAP RELEASE    . TOTAL HIP ARTHROPLASTY  10/07/2011   Procedure: TOTAL HIP ARTHROPLASTY;  Surgeon: Kerin Salen, MD;  Location: [place];   Service: Orthopedics;  Laterality: Right;    No prescriptions prior to admission.   No Known Allergies  Social History  Substance Use Topics  . Smoking status: Never Smoker  . Smokeless tobacco: Never Used  . Alcohol use No    Family History  Problem Relation Age of Onset  . Cancer Mother   . Cancer Sister   . Colon cancer Sister      Review of Systems  Constitutional: Negative.   HENT: Negative.   Eyes: Negative.   Respiratory: Negative.   Cardiovascular: Negative.   Gastrointestinal: Negative.   Genitourinary: Negative.   Musculoskeletal: Positive for joint pain.  Skin: Negative.   Neurological: Negative.   Endo/Heme/Allergies: Negative.   Psychiatric/Behavioral: Negative.     Objective:  Physical Exam  Constitutional: He is oriented to person, place, and time. He appears well-developed and well-nourished.  HENT:  Head: Normocephalic and atraumatic.  Eyes: Pupils are equal, round, and reactive to light.  Neck: Normal range of motion. Neck supple.  Cardiovascular: Intact distal pulses.   Respiratory: Effort normal.  Musculoskeletal: He exhibits tenderness.  Significant pain.  Any attempts at internal rotation.  Foot tap is one plus positive.  Skin over the left hip is intact.    Neurological: He is alert and oriented to person, place, and time.  Skin: Skin is warm and dry.  Psychiatric: He has a normal mood and affect. His behavior is normal. Judgment and thought content normal.    Vital signs in last 24 hours:    Labs:  Estimated body mass index is 32.1 kg/m as calculated from the following:   Height as of 02/07/17: 6\' 2"  (1.88 m).   Weight as of 02/07/17: 113.4 kg (250 lb).   Imaging Review Plain radiographs demonstrate  plain x-rays and a CT scan accomplished showing a fracture of the femoral head neck junction through a well marginated subchondral cyst.  The cyst is about 1/2 cm in diameter and it is the wall of the cyst that is adjacent to the  junction of the femoral head and neck.    Assessment/Plan:  End stage arthritis, left hip(s)  The patient history, physical examination, clinical judgement of the provider and imaging studies are consistent with end stage degenerative joint disease of the left hip(s) and total hip arthroplasty is deemed medically necessary. The treatment options including medical management, injection therapy, arthroscopy and arthroplasty were discussed at length. The risks and benefits of total hip arthroplasty were presented and reviewed. The risks due to aseptic loosening, infection, stiffness, dislocation/subluxation,  thromboembolic complications and other imponderables were discussed.  The patient acknowledged the explanation, agreed to proceed with the plan and consent was signed. Patient is being admitted for inpatient treatment for surgery, pain control, PT, OT, prophylactic antibiotics, VTE prophylaxis, progressive ambulation and ADL's and discharge planning.The patient is planning to be discharged home with home health services.

## 2017-02-16 ENCOUNTER — Encounter (HOSPITAL_COMMUNITY)
Admission: RE | Admit: 2017-02-16 | Discharge: 2017-02-16 | Disposition: A | Discharge status: Home / Self Care | Payer: Managed Care, Other (non HMO) | Source: Ambulatory Visit | Attending: Orthopedic Surgery | Admitting: Orthopedic Surgery

## 2017-02-16 ENCOUNTER — Encounter (HOSPITAL_COMMUNITY): Payer: Self-pay

## 2017-02-16 HISTORY — DX: Other complications of anesthesia, initial encounter: T88.59XA

## 2017-02-16 HISTORY — DX: Adverse effect of unspecified anesthetic, initial encounter: T41.45XA

## 2017-02-16 LAB — URINALYSIS, ROUTINE W REFLEX MICROSCOPIC
Bilirubin Urine: NEGATIVE
GLUCOSE, UA: NEGATIVE mg/dL
HGB URINE DIPSTICK: NEGATIVE
Ketones, ur: NEGATIVE mg/dL
Leukocytes, UA: NEGATIVE
Nitrite: NEGATIVE
PH: 6 (ref 5.0–8.0)
Protein, ur: NEGATIVE mg/dL
SPECIFIC GRAVITY, URINE: 1.015 (ref 1.005–1.030)

## 2017-02-16 LAB — APTT: aPTT: 29 seconds (ref 24–36)

## 2017-02-16 LAB — PROTIME-INR
INR: 0.98
PROTHROMBIN TIME: 13 s (ref 11.4–15.2)

## 2017-02-16 LAB — SURGICAL PCR SCREEN
MRSA, PCR: POSITIVE — AB
STAPHYLOCOCCUS AUREUS: POSITIVE — AB

## 2017-02-16 LAB — TYPE AND SCREEN
ABO/RH(D): A POS
Antibody Screen: NEGATIVE

## 2017-02-16 MED ORDER — CEFAZOLIN SODIUM-DEXTROSE 2-4 GM/100ML-% IV SOLN
2.0000 g | INTRAVENOUS | Status: AC
  Administered 2017-02-17: 2 g via INTRAVENOUS
  Filled 2017-02-16: qty 100

## 2017-02-16 MED ORDER — TRANEXAMIC ACID 1000 MG/10ML IV SOLN
2000.0000 mg | INTRAVENOUS | Status: AC
  Administered 2017-02-17: 2000 mg via TOPICAL
  Filled 2017-02-16: qty 20

## 2017-02-16 MED ORDER — BUPIVACAINE LIPOSOME 1.3 % IJ SUSP
20.0000 mL | Freq: Once | INTRAMUSCULAR | Status: AC
  Administered 2017-02-17: 20 mL
  Filled 2017-02-16: qty 20

## 2017-02-16 MED ORDER — DEXTROSE-NACL 5-0.45 % IV SOLN: INTRAVENOUS | Status: DC

## 2017-02-16 MED ORDER — TRANEXAMIC ACID 1000 MG/10ML IV SOLN
1000.0000 mg | INTRAVENOUS | Status: AC
  Administered 2017-02-17: 1000 mg via INTRAVENOUS
  Filled 2017-02-16: qty 10

## 2017-02-16 NOTE — Progress Notes (Signed)
Positive PCR results noted- will need betadine swabs the day of surgery.

## 2017-02-16 NOTE — Progress Notes (Signed)
PCP - Dr. Fulton Reek Cardiologist - denies  EKG - 01/21/17 CXR - 01/21/17  Echo/Stress test/Cath - denies  Patient denies chest pain and shortness of breath at PAT appointment.    Patient aware of time change and will arrive at 0800.

## 2017-02-16 NOTE — Pre-Procedure Instructions (Signed)
    [name]  02/16/2017      [id number] - [place], [place] - [address] [address] Phone: [phone] Fax: [phone]  [place], [place] [address] Phone: [phone] Fax: [phone]  [id number] - [place], [address] [address] Phone: [phone] Fax: [phone]  [facility] [place], [place] - [address] [place] [address] Phone: [phone] Fax: [phone]    Your procedure is scheduled on Friday, February 17, 2017  Report to [facility] Admitting at 8:30 A.M.  Call this number if you have problems the morning of surgery:  [phone]   Remember:  Do not eat food or drink liquids after midnight.  Take these medicines the morning of surgery with A SIP OF WATER: if needed: albuterol (PROVENTIL) (2.5 MG/3ML) or levalbuterol (XOPENEX)  nebulizer solution for wheezing or shortness of breath, albuterol (PROVENTIL HFA;VENTOLIN HFA)  Inhaler for wheezing or shortness of breath ( bring inhaler in with you tomorrow) Stop taking Aspirin, vitamins, fish oil and herbal medications. Do not take any NSAIDs ie: Ibuprofen, Advil, Naproxen, ketorolac (TORADOL), BC and Goody Powder  or any medication containing Aspirin; stop now.  Do not wear jewelry, make-up or nail polish.  Do not wear lotions, powders, or perfumes, or deoderant.  Do not shave 48 hours prior to surgery.  Men may shave face and neck.  Do not bring valuables to the hospital.  [facility] is not responsible for any belongings or valuables.  Contacts, dentures or bridgework may not be worn into surgery.  Leave your suitcase in the car.  After surgery it may be brought to your room.  For patients admitted to the hospital, discharge time will be determined by your  treatment team. Special instructions: Shower tonight and the morning of surgery with CHG.  Please read over the following fact sheets that you were given. Pain Booklet, Coughing and Deep Breathing, Blood Transfusion Information, Total Joint Packet and Surgical Site Infection Prevention

## 2017-02-17 ENCOUNTER — Inpatient Hospital Stay (HOSPITAL_COMMUNITY): Payer: Managed Care, Other (non HMO) | Admitting: Certified Registered Nurse Anesthetist

## 2017-02-17 ENCOUNTER — Encounter (HOSPITAL_COMMUNITY): Payer: Self-pay | Admitting: *Deleted

## 2017-02-17 ENCOUNTER — Inpatient Hospital Stay (HOSPITAL_COMMUNITY): Payer: Managed Care, Other (non HMO)

## 2017-02-17 ENCOUNTER — Inpatient Hospital Stay (HOSPITAL_COMMUNITY)
Admission: RE | Admit: 2017-02-17 | Discharge: 2017-02-18 | DRG: 470 | Disposition: A | Discharge status: Home / Self Care | Payer: Managed Care, Other (non HMO) | Source: Ambulatory Visit | Attending: Orthopedic Surgery | Admitting: Orthopedic Surgery

## 2017-02-17 ENCOUNTER — Encounter (HOSPITAL_COMMUNITY)
Admission: RE | Disposition: A | Discharge status: Home / Self Care | Payer: Self-pay | Source: Ambulatory Visit | Attending: Orthopedic Surgery

## 2017-02-17 DIAGNOSIS — Z96641 Presence of right artificial hip joint: Secondary | ICD-10-CM | POA: Diagnosis present

## 2017-02-17 DIAGNOSIS — F909 Attention-deficit hyperactivity disorder, unspecified type: Secondary | ICD-10-CM | POA: Diagnosis present

## 2017-02-17 DIAGNOSIS — M879 Osteonecrosis, unspecified: Secondary | ICD-10-CM | POA: Diagnosis present

## 2017-02-17 DIAGNOSIS — S72002A Fracture of unspecified part of neck of left femur, initial encounter for closed fracture: Principal | ICD-10-CM | POA: Diagnosis present

## 2017-02-17 DIAGNOSIS — W010XXA Fall on same level from slipping, tripping and stumbling without subsequent striking against object, initial encounter: Secondary | ICD-10-CM | POA: Diagnosis present

## 2017-02-17 DIAGNOSIS — Y92096 Garden or yard of other non-institutional residence as the place of occurrence of the external cause: Secondary | ICD-10-CM

## 2017-02-17 DIAGNOSIS — Z800 Family history of malignant neoplasm of digestive organs: Secondary | ICD-10-CM | POA: Diagnosis not present

## 2017-02-17 DIAGNOSIS — E782 Mixed hyperlipidemia: Secondary | ICD-10-CM | POA: Diagnosis present

## 2017-02-17 DIAGNOSIS — M87059 Idiopathic aseptic necrosis of unspecified femur: Secondary | ICD-10-CM | POA: Diagnosis present

## 2017-02-17 DIAGNOSIS — Z419 Encounter for procedure for purposes other than remedying health state, unspecified: Secondary | ICD-10-CM

## 2017-02-17 DIAGNOSIS — M25552 Pain in left hip: Secondary | ICD-10-CM | POA: Diagnosis present

## 2017-02-17 HISTORY — PX: TOTAL HIP ARTHROPLASTY: SHX124

## 2017-02-17 SURGERY — ARTHROPLASTY, HIP, TOTAL, ANTERIOR APPROACH
Anesthesia: Spinal | Laterality: Left

## 2017-02-17 MED ORDER — LABETALOL HCL 5 MG/ML IV SOLN
INTRAVENOUS | Status: AC
  Filled 2017-02-17: qty 4

## 2017-02-17 MED ORDER — MIDAZOLAM HCL 2 MG/2ML IJ SOLN
INTRAMUSCULAR | Status: AC
  Filled 2017-02-17: qty 2

## 2017-02-17 MED ORDER — ACETAMINOPHEN 650 MG RE SUPP: 650.0000 mg | Freq: Four times a day (QID) | RECTAL | Status: DC | PRN

## 2017-02-17 MED ORDER — LIDOCAINE 2% (20 MG/ML) 5 ML SYRINGE
INTRAMUSCULAR | Status: AC
  Filled 2017-02-17: qty 5

## 2017-02-17 MED ORDER — 0.9 % SODIUM CHLORIDE (POUR BTL) OPTIME
TOPICAL | Status: DC | PRN
  Administered 2017-02-17: 1000 mL

## 2017-02-17 MED ORDER — METOPROLOL TARTARATE 1 MG/ML SYRINGE (5ML)
Status: DC | PRN
  Administered 2017-02-17 (×2): 2 mg via INTRAVENOUS
  Administered 2017-02-17: 1 mg via INTRAVENOUS

## 2017-02-17 MED ORDER — MIDAZOLAM HCL 2 MG/2ML IJ SOLN
INTRAMUSCULAR | Status: DC | PRN
  Administered 2017-02-17 (×2): 1 mg via INTRAVENOUS

## 2017-02-17 MED ORDER — OXYCODONE HCL 5 MG PO TABS
ORAL_TABLET | ORAL | Status: AC
  Filled 2017-02-17: qty 1

## 2017-02-17 MED ORDER — LACTATED RINGERS IV SOLN
INTRAVENOUS | Status: DC
  Administered 2017-02-17 (×3): via INTRAVENOUS

## 2017-02-17 MED ORDER — OXYCODONE HCL 5 MG PO TABS
5.0000 mg | ORAL_TABLET | Freq: Once | ORAL | Status: AC | PRN
  Administered 2017-02-17: 5 mg via ORAL

## 2017-02-17 MED ORDER — HYDROMORPHONE HCL 1 MG/ML IJ SOLN
INTRAMUSCULAR | Status: AC
  Filled 2017-02-17: qty 0.5

## 2017-02-17 MED ORDER — PROMETHAZINE HCL 25 MG/ML IJ SOLN
6.2500 mg | INTRAMUSCULAR | Status: DC | PRN
  Administered 2017-02-17: 6.25 mg via INTRAVENOUS

## 2017-02-17 MED ORDER — PHENOL 1.4 % MT LIQD: 1.0000 | OROMUCOSAL | Status: DC | PRN

## 2017-02-17 MED ORDER — KETAMINE HCL 10 MG/ML IJ SOLN
INTRAMUSCULAR | Status: DC | PRN
  Administered 2017-02-17 (×2): 20 mg via INTRAVENOUS

## 2017-02-17 MED ORDER — POLYETHYLENE GLYCOL 3350 17 G PO PACK: 17.0000 g | PACK | Freq: Every day | ORAL | Status: DC | PRN

## 2017-02-17 MED ORDER — LABETALOL HCL 5 MG/ML IV SOLN
INTRAVENOUS | Status: DC | PRN
  Administered 2017-02-17: 10 mg via INTRAVENOUS
  Administered 2017-02-17: 5 mg via INTRAVENOUS

## 2017-02-17 MED ORDER — METOCLOPRAMIDE HCL 5 MG PO TABS: 5.0000 mg | ORAL_TABLET | Freq: Three times a day (TID) | ORAL | Status: DC | PRN

## 2017-02-17 MED ORDER — FENTANYL CITRATE (PF) 250 MCG/5ML IJ SOLN
INTRAMUSCULAR | Status: AC
  Filled 2017-02-17: qty 5

## 2017-02-17 MED ORDER — METHOCARBAMOL 500 MG PO TABS
500.0000 mg | ORAL_TABLET | Freq: Four times a day (QID) | ORAL | Status: DC | PRN
  Administered 2017-02-17 – 2017-02-18 (×4): 500 mg via ORAL
  Filled 2017-02-17 (×3): qty 1

## 2017-02-17 MED ORDER — METOPROLOL TARTRATE 5 MG/5ML IV SOLN
INTRAVENOUS | Status: AC
  Filled 2017-02-17: qty 5

## 2017-02-17 MED ORDER — ACETAMINOPHEN 325 MG PO TABS: 650.0000 mg | ORAL_TABLET | Freq: Four times a day (QID) | ORAL | Status: DC | PRN

## 2017-02-17 MED ORDER — GLYCOPYRROLATE 0.2 MG/ML IJ SOLN
INTRAMUSCULAR | Status: DC | PRN
  Administered 2017-02-17: 0.2 mg via INTRAVENOUS

## 2017-02-17 MED ORDER — FLEET ENEMA 7-19 GM/118ML RE ENEM: 1.0000 | ENEMA | Freq: Once | RECTAL | Status: DC | PRN

## 2017-02-17 MED ORDER — ALBUTEROL SULFATE (2.5 MG/3ML) 0.083% IN NEBU: 2.5000 mg | INHALATION_SOLUTION | Freq: Four times a day (QID) | RESPIRATORY_TRACT | Status: DC | PRN

## 2017-02-17 MED ORDER — CHLORHEXIDINE GLUCONATE 4 % EX LIQD: 60.0000 mL | Freq: Once | CUTANEOUS | Status: DC

## 2017-02-17 MED ORDER — OXYCODONE HCL 5 MG/5ML PO SOLN: 5.0000 mg | Freq: Once | ORAL | Status: AC | PRN

## 2017-02-17 MED ORDER — DIPHENHYDRAMINE HCL 12.5 MG/5ML PO ELIX
12.5000 mg | ORAL_SOLUTION | ORAL | Status: DC | PRN
Start: 2017-02-17 — End: 2017-02-18

## 2017-02-17 MED ORDER — OXYCODONE HCL 5 MG PO TABS
5.0000 mg | ORAL_TABLET | ORAL | Status: DC | PRN
  Administered 2017-02-17 (×2): 5 mg via ORAL
  Administered 2017-02-17 – 2017-02-18 (×3): 10 mg via ORAL
  Filled 2017-02-17 (×5): qty 2

## 2017-02-17 MED ORDER — BUPIVACAINE-EPINEPHRINE (PF) 0.5% -1:200000 IJ SOLN
INTRAMUSCULAR | Status: DC | PRN
  Administered 2017-02-17: 20 mL via PERINEURAL
  Administered 2017-02-17: 30 mL via PERINEURAL

## 2017-02-17 MED ORDER — KCL IN DEXTROSE-NACL 20-5-0.45 MEQ/L-%-% IV SOLN
INTRAVENOUS | Status: DC
  Administered 2017-02-17 – 2017-02-18 (×2): via INTRAVENOUS
  Filled 2017-02-17 (×2): qty 1000

## 2017-02-17 MED ORDER — DOCUSATE SODIUM 100 MG PO CAPS
100.0000 mg | ORAL_CAPSULE | Freq: Two times a day (BID) | ORAL | Status: DC
  Administered 2017-02-17: 100 mg via ORAL
  Filled 2017-02-17 (×2): qty 1

## 2017-02-17 MED ORDER — EPINEPHRINE PF 1 MG/ML IJ SOLN
INTRAMUSCULAR | Status: AC
  Filled 2017-02-17: qty 1

## 2017-02-17 MED ORDER — ASPIRIN EC 325 MG PO TBEC: 325.0000 mg | DELAYED_RELEASE_TABLET | Freq: Two times a day (BID) | ORAL | 0 refills | Status: DC

## 2017-02-17 MED ORDER — ONDANSETRON HCL 4 MG PO TABS: 4.0000 mg | ORAL_TABLET | Freq: Four times a day (QID) | ORAL | Status: DC | PRN

## 2017-02-17 MED ORDER — BUPIVACAINE HCL (PF) 0.5 % IJ SOLN
INTRAMUSCULAR | Status: DC | PRN
  Administered 2017-02-17: 3 mL via INTRATHECAL

## 2017-02-17 MED ORDER — BUPIVACAINE HCL (PF) 0.5 % IJ SOLN
INTRAMUSCULAR | Status: AC
  Filled 2017-02-17: qty 60

## 2017-02-17 MED ORDER — HYDROMORPHONE HCL 1 MG/ML IJ SOLN
0.5000 mg | INTRAMUSCULAR | Status: DC | PRN
  Administered 2017-02-17 – 2017-02-18 (×5): 0.5 mg via INTRAVENOUS
  Filled 2017-02-17 (×5): qty 1

## 2017-02-17 MED ORDER — ONDANSETRON HCL 4 MG/2ML IJ SOLN
INTRAMUSCULAR | Status: AC
  Filled 2017-02-17: qty 2

## 2017-02-17 MED ORDER — GABAPENTIN 300 MG PO CAPS
300.0000 mg | ORAL_CAPSULE | Freq: Three times a day (TID) | ORAL | Status: DC
  Administered 2017-02-17 – 2017-02-18 (×3): 300 mg via ORAL
  Filled 2017-02-17 (×3): qty 1

## 2017-02-17 MED ORDER — MENTHOL 3 MG MT LOZG: 1.0000 | LOZENGE | OROMUCOSAL | Status: DC | PRN

## 2017-02-17 MED ORDER — ASPIRIN EC 325 MG PO TBEC
325.0000 mg | DELAYED_RELEASE_TABLET | Freq: Every day | ORAL | Status: DC
  Administered 2017-02-18: 325 mg via ORAL
  Filled 2017-02-17: qty 1

## 2017-02-17 MED ORDER — OXYCODONE-ACETAMINOPHEN 10-325 MG PO TABS: 1.0000 | ORAL_TABLET | Freq: Four times a day (QID) | ORAL | 0 refills | Status: DC | PRN

## 2017-02-17 MED ORDER — DEXAMETHASONE SODIUM PHOSPHATE 10 MG/ML IJ SOLN
10.0000 mg | Freq: Once | INTRAMUSCULAR | Status: DC
  Filled 2017-02-17: qty 1

## 2017-02-17 MED ORDER — PHENYLEPHRINE HCL 10 MG/ML IJ SOLN
INTRAMUSCULAR | Status: DC | PRN
  Administered 2017-02-17: 10 ug/min via INTRAVENOUS

## 2017-02-17 MED ORDER — METOCLOPRAMIDE HCL 5 MG/ML IJ SOLN: 5.0000 mg | Freq: Three times a day (TID) | INTRAMUSCULAR | Status: DC | PRN

## 2017-02-17 MED ORDER — PROPOFOL 500 MG/50ML IV EMUL
INTRAVENOUS | Status: DC | PRN
  Administered 2017-02-17: 30 ug/kg/min via INTRAVENOUS

## 2017-02-17 MED ORDER — PROMETHAZINE HCL 25 MG/ML IJ SOLN
INTRAMUSCULAR | Status: AC
  Filled 2017-02-17: qty 1

## 2017-02-17 MED ORDER — FENTANYL CITRATE (PF) 250 MCG/5ML IJ SOLN
INTRAMUSCULAR | Status: DC | PRN
  Administered 2017-02-17 (×5): 50 ug via INTRAVENOUS

## 2017-02-17 MED ORDER — LEVALBUTEROL HCL 1.25 MG/0.5ML IN NEBU
1.2500 mg | INHALATION_SOLUTION | RESPIRATORY_TRACT | Status: DC | PRN
  Filled 2017-02-17: qty 0.5

## 2017-02-17 MED ORDER — KETAMINE HCL-SODIUM CHLORIDE 100-0.9 MG/10ML-% IV SOSY
PREFILLED_SYRINGE | INTRAVENOUS | Status: AC
  Filled 2017-02-17: qty 10

## 2017-02-17 MED ORDER — HYDROMORPHONE HCL 1 MG/ML IJ SOLN
0.2500 mg | INTRAMUSCULAR | Status: DC | PRN
  Administered 2017-02-17: 0.5 mg via INTRAVENOUS

## 2017-02-17 MED ORDER — ALUMINUM HYDROXIDE GEL 320 MG/5ML PO SUSP
15.0000 mL | ORAL | Status: DC | PRN
  Filled 2017-02-17: qty 30

## 2017-02-17 MED ORDER — CELECOXIB 200 MG PO CAPS
200.0000 mg | ORAL_CAPSULE | Freq: Two times a day (BID) | ORAL | Status: DC
  Administered 2017-02-17 – 2017-02-18 (×2): 200 mg via ORAL
  Filled 2017-02-17 (×3): qty 1

## 2017-02-17 MED ORDER — METHOCARBAMOL 500 MG PO TABS: 500.0000 mg | ORAL_TABLET | Freq: Two times a day (BID) | ORAL | 0 refills | Status: DC

## 2017-02-17 MED ORDER — LIDOCAINE HCL (CARDIAC) 20 MG/ML IV SOLN
INTRAVENOUS | Status: DC | PRN
  Administered 2017-02-17: 40 mg via INTRATRACHEAL

## 2017-02-17 MED ORDER — METHOCARBAMOL 500 MG PO TABS
ORAL_TABLET | ORAL | Status: AC
  Filled 2017-02-17: qty 1

## 2017-02-17 MED ORDER — PROPOFOL 10 MG/ML IV BOLUS
INTRAVENOUS | Status: AC
  Filled 2017-02-17: qty 20

## 2017-02-17 MED ORDER — METHOCARBAMOL 1000 MG/10ML IJ SOLN
500.0000 mg | Freq: Four times a day (QID) | INTRAVENOUS | Status: DC | PRN
  Filled 2017-02-17: qty 5

## 2017-02-17 MED ORDER — BISACODYL 5 MG PO TBEC: 5.0000 mg | DELAYED_RELEASE_TABLET | Freq: Every day | ORAL | Status: DC | PRN

## 2017-02-17 MED ORDER — ONDANSETRON HCL 4 MG/2ML IJ SOLN
INTRAMUSCULAR | Status: DC | PRN
  Administered 2017-02-17: 4 mg via INTRAVENOUS

## 2017-02-17 MED ORDER — TRAZODONE HCL 50 MG PO TABS
150.0000 mg | ORAL_TABLET | Freq: Every day | ORAL | Status: DC
  Administered 2017-02-17: 21:00:00 150 mg via ORAL
  Filled 2017-02-17: qty 1

## 2017-02-17 MED ORDER — PROPOFOL 10 MG/ML IV BOLUS
INTRAVENOUS | Status: DC | PRN
  Administered 2017-02-17: 30 mg via INTRAVENOUS
  Administered 2017-02-17: 20 mg via INTRAVENOUS
  Administered 2017-02-17 (×3): 50 mg via INTRAVENOUS
  Administered 2017-02-17: 20 mg via INTRAVENOUS

## 2017-02-17 MED ORDER — ONDANSETRON HCL 4 MG/2ML IJ SOLN
4.0000 mg | Freq: Four times a day (QID) | INTRAMUSCULAR | Status: DC | PRN
  Administered 2017-02-17 – 2017-02-18 (×2): 4 mg via INTRAVENOUS
  Filled 2017-02-17 (×3): qty 2

## 2017-02-17 SURGICAL SUPPLY — 43 items
BAG DECANTER FOR FLEXI CONT (MISCELLANEOUS) ×3 IMPLANT
BLADE SAW SGTL 18X1.27X75 (BLADE) ×2 IMPLANT
BLADE SAW SGTL 18X1.27X75MM (BLADE) ×1
CAPT HIP TOTAL 2 ×3 IMPLANT
COVER PERINEAL POST (MISCELLANEOUS) ×3 IMPLANT
COVER SURGICAL LIGHT HANDLE (MISCELLANEOUS) ×3 IMPLANT
DRAPE C-ARM 42X72 X-RAY (DRAPES) ×3 IMPLANT
DRAPE STERI IOBAN 125X83 (DRAPES) ×3 IMPLANT
DRAPE U-SHAPE 47X51 STRL (DRAPES) ×6 IMPLANT
DRSG AQUACEL AG ADV 3.5X10 (GAUZE/BANDAGES/DRESSINGS) ×3 IMPLANT
DURAPREP 26ML APPLICATOR (WOUND CARE) ×3 IMPLANT
ELECT BLADE 4.0 EZ CLEAN MEGAD (MISCELLANEOUS) ×3
ELECT REM PT RETURN 9FT ADLT (ELECTROSURGICAL) ×3
ELECTRODE BLDE 4.0 EZ CLN MEGD (MISCELLANEOUS) ×1 IMPLANT
ELECTRODE REM PT RTRN 9FT ADLT (ELECTROSURGICAL) ×1 IMPLANT
FACESHIELD WRAPAROUND (MASK) ×6 IMPLANT
GLOVE BIO SURGEON STRL SZ7.5 (GLOVE) ×3 IMPLANT
GLOVE BIO SURGEON STRL SZ8.5 (GLOVE) ×3 IMPLANT
GLOVE BIOGEL PI IND STRL 8 (GLOVE) ×1 IMPLANT
GLOVE BIOGEL PI IND STRL 9 (GLOVE) ×1 IMPLANT
GLOVE BIOGEL PI INDICATOR 8 (GLOVE) ×2
GLOVE BIOGEL PI INDICATOR 9 (GLOVE) ×2
GOWN STRL REUS W/ TWL LRG LVL3 (GOWN DISPOSABLE) ×1 IMPLANT
GOWN STRL REUS W/ TWL XL LVL3 (GOWN DISPOSABLE) ×2 IMPLANT
GOWN STRL REUS W/TWL LRG LVL3 (GOWN DISPOSABLE) ×2
GOWN STRL REUS W/TWL XL LVL3 (GOWN DISPOSABLE) ×4
KIT BASIN OR (CUSTOM PROCEDURE TRAY) ×3 IMPLANT
KIT ROOM TURNOVER OR (KITS) ×3 IMPLANT
MANIFOLD NEPTUNE II (INSTRUMENTS) ×3 IMPLANT
NEEDLE HYPO 22GX1.5 SAFETY (NEEDLE) ×6 IMPLANT
NS IRRIG 1000ML POUR BTL (IV SOLUTION) ×3 IMPLANT
PACK TOTAL JOINT (CUSTOM PROCEDURE TRAY) ×3 IMPLANT
PAD ARMBOARD 7.5X6 YLW CONV (MISCELLANEOUS) ×6 IMPLANT
SUT VIC AB 1 CTX 36 (SUTURE) ×2
SUT VIC AB 1 CTX36XBRD ANBCTR (SUTURE) ×1 IMPLANT
SUT VIC AB 2-0 CT1 27 (SUTURE) ×2
SUT VIC AB 2-0 CT1 TAPERPNT 27 (SUTURE) ×1 IMPLANT
SUT VIC AB 3-0 PS2 18 (SUTURE) ×2
SUT VIC AB 3-0 PS2 18XBRD (SUTURE) ×1 IMPLANT
SYR CONTROL 10ML LL (SYRINGE) ×6 IMPLANT
TOWEL OR 17X24 6PK STRL BLUE (TOWEL DISPOSABLE) ×3 IMPLANT
TOWEL OR 17X26 10 PK STRL BLUE (TOWEL DISPOSABLE) ×3 IMPLANT
TRAY CATH 16FR W/PLASTIC CATH (SET/KITS/TRAYS/PACK) IMPLANT

## 2017-02-17 NOTE — Anesthesia Postprocedure Evaluation (Signed)
Anesthesia Post Note  Patient: [name]  Procedure(s) Performed: Procedure(s) (LRB): TOTAL HIP ARTHROPLASTY ANTERIOR APPROACH (Left)     Patient location during evaluation: PACU Anesthesia Type: Spinal Level of consciousness: oriented and awake and alert Pain management: pain level controlled Vital Signs Assessment: post-procedure vital signs reviewed and stable Respiratory status: spontaneous breathing and respiratory function stable Cardiovascular status: blood pressure returned to baseline and stable Postop Assessment: no headache and no backache Anesthetic complications: no    Last Vitals:  Vitals:   02/17/17 1310 02/17/17 1315  BP: 100/78   Pulse: (!) 58   Resp: 16 19  Temp:      Last Pain:  Vitals:   02/17/17 0825  PainSc: [name]

## 2017-02-17 NOTE — Anesthesia Preprocedure Evaluation (Signed)
Anesthesia Evaluation  Patient identified by MRN, date of birth, ID band  Reviewed: Allergy & Precautions, H&P , NPO status , Patient's Chart, lab work & pertinent test results  Airway Mallampati: III  TM Distance: >3 FB Neck ROM: full    Dental no notable dental hx.    Pulmonary    Pulmonary exam normal        Cardiovascular  Rhythm:regular Rate:Normal     Neuro/Psych    GI/Hepatic   Endo/Other    Renal/GU      Musculoskeletal   Abdominal (+) + obese,   Peds  Hematology   Anesthesia Other Findings   Reproductive/Obstetrics                             Anesthesia Physical  Anesthesia Plan  ASA: II  Anesthesia Plan: Spinal   Post-op Pain Management:    Induction:   Airway Management Planned: Simple Face Mask  Additional Equipment:   Intra-op Plan:   Post-operative Plan:   Informed Consent: I have reviewed the patients History and Physical, chart, labs and discussed the procedure including the risks, benefits and alternatives for the proposed anesthesia with the patient or authorized representative who has indicated his/her understanding and acceptance.     Plan Discussed with: CRNA  Anesthesia Plan Comments:         Anesthesia Quick Evaluation

## 2017-02-17 NOTE — Evaluation (Signed)
Physical Therapy Evaluation Patient Details Name: [name] MRN: [id number] DOB: Sep 12, 1971 Today's Date: 02/17/2017   History of Present Illness  Pt is s/p elective L THA secondary to L femoral neck fx. 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   Clinical Impression  Pt s/p surgery above with deficits below. PTA, pt was independent with functional mobility and occaisionally used a cane. Upon evaluation, pt experiencing post op pain and weakness, however, did not limit ambulation tolerance. Pt was min guard for ambulation and basic transfers. Able to perform bed mobility mod I. Will need to complete stair training and review LLE exercise in tomorrows session. Follow up recommendations as arranged by MD. Will continue to follow acutely.     Follow Up Recommendations DC plan and follow up therapy as arranged by surgeon;Supervision - Intermittent    Equipment Recommendations  None recommended by PT    Recommendations for Other Services       Precautions / Restrictions Precautions Precautions: None Precaution Comments: Reviewed and administered THA exercise handout. will need to go over seated and standing exercise with pt.  Restrictions Weight Bearing Restrictions: Yes LLE Weight Bearing: Weight bearing as tolerated      Mobility  Bed Mobility Overal bed mobility: Modified Independent                Transfers Overall transfer level: Needs assistance Equipment used: Rolling walker (2 wheeled) Transfers: Sit to/from Stand Sit to Stand: Min guard         General transfer comment: Min guard for safety. No physical assist required. VC for safe hand placement   Ambulation/Gait Ambulation/Gait assistance: Min guard;Supervision Ambulation Distance (Feet): 500 Feet Assistive device: Rolling walker (2 wheeled) Gait Pattern/deviations: Step-through pattern;Decreased stride length Gait velocity: WNL  Gait velocity interpretation: at or  above normal speed for age/gender General Gait Details: Steady gait with RW. Verbal cues for upright posture when using RW. Good speed.   Stairs            Wheelchair Mobility    Modified Rankin (Stroke Patients Only)       Balance Overall balance assessment: Needs assistance Sitting-balance support: No upper extremity supported;Feet supported Sitting balance-Leahy Scale: Normal     Standing balance support: Bilateral upper extremity supported;During functional activity Standing balance-Leahy Scale: Fair Standing balance comment: able to stand statically without UE support                             Pertinent Vitals/Pain Pain Assessment: 0-10 Pain Score: 5  Pain Location: L hip  Pain Descriptors / Indicators: Throbbing;Operative site guarding Pain Intervention(s): Limited activity within patient's tolerance;Monitored during session;Repositioned;Patient requesting pain meds-RN notified    Home Living Family/patient expects to be discharged to:: Private residence Living Arrangements: Spouse/significant other Available Help at Discharge: Family;Available 24 hours/day Type of Home: House Home Access: Stairs to enter Entrance Stairs-Rails: Right;Left;Can reach both Entrance Stairs-Number of Steps: 5 Home Layout: One level Home Equipment: Walker - 2 wheels;Cane - single point;Bedside commode      Prior Function Level of Independence: Independent with assistive device(s)         Comments: occasionally used cane      Hand Dominance   Dominant Hand: Left    Extremity/Trunk Assessment   Upper Extremity Assessment Upper Extremity Assessment: Overall WFL for tasks assessed    Lower Extremity Assessment Lower Extremity Assessment: LLE deficits/detail LLE  Deficits / Details: Sensory in tact. Able to perform ther ex below. Deficits consistent with post op pain and weakness.     Cervical / Trunk Assessment Cervical / Trunk Assessment: Normal   Communication   Communication: No difficulties  Cognition Arousal/Alertness: Suspect due to medications;Awake/alert (very giddy during therapy ) Behavior During Therapy: WFL for tasks assessed/performed Overall Cognitive Status: Within Functional Limits for tasks assessed                                        General Comments General comments (skin integrity, edema, etc.): Pt's wife present throughout session.     Exercises Total Joint Exercises Ankle Circles/Pumps: AROM;Both;20 reps;Supine Quad Sets: AROM;Left;10 reps;Supine Short Arc Quad: AROM;Left;10 reps;Supine Heel Slides: AROM;Left;10 reps;Supine Hip ABduction/ADduction: AROM;Left;10 reps;Supine   Assessment/Plan    PT Assessment Patient needs continued PT services  PT Problem List Decreased strength;Decreased range of motion;Decreased mobility;Decreased knowledge of use of DME;Decreased knowledge of precautions;Pain       PT Treatment Interventions DME instruction;Gait training;Functional mobility training;Stair training;Therapeutic activities;Therapeutic exercise;Balance training;Neuromuscular re-education;Patient/family education    PT Goals (Current goals can be found in the Care Plan section)  Acute Rehab PT Goals Patient Stated Goal: to go home ASAP  PT Goal Formulation: With patient Time For Goal Achievement: 02/24/17 Potential to Achieve Goals: Good    Frequency 7X/week   Barriers to discharge        Co-evaluation               AM-PAC PT "6 Clicks" Daily Activity  Outcome Measure Difficulty turning over in bed (including adjusting bedclothes, sheets and blankets)?: None Difficulty moving from lying on back to sitting on the side of the bed? : None Difficulty sitting down on and standing up from a chair with arms (e.g., wheelchair, bedside commode, etc,.)?: None Help needed moving to and from a bed to chair (including a wheelchair)?: A Little Help needed walking in hospital  room?: A Little Help needed climbing 3-5 steps with a railing? : A Little 6 Click Score: 21    End of Session Equipment Utilized During Treatment: Gait belt Activity Tolerance: Patient tolerated treatment well Patient left: in bed;with call bell/phone within reach;with family/visitor present Nurse Communication: Mobility status;Patient requests pain meds PT Visit Diagnosis: Other abnormalities of gait and mobility (R26.89);Pain Pain - Right/Left: Left Pain - part of body: Hip    Time: 3295-1884 PT Time Calculation (min) (ACUTE ONLY): 24 min   Charges:   PT Evaluation $PT Eval Low Complexity: 1 Procedure PT Treatments $Gait Training: 8-22 mins   PT G Codes:        Nicky Pugh, PT, DPT  Acute Rehabilitation Services  Pager: [phone]   Army Melia 02/17/2017, 7:27 PM

## 2017-02-17 NOTE — Discharge Instructions (Signed)

## 2017-02-17 NOTE — Op Note (Signed)
OPERATIVE REPORT    DATE OF PROCEDURE:  02/17/2017       PREOPERATIVE DIAGNOSIS:  LEFT FEMORAL NECK FRACTURE                                                          POSTOPERATIVE DIAGNOSIS:  LEFT FEMORAL NECK FRACTURE                                                           PROCEDURE: Anterior L total hip arthroplasty using a 54 mm DePuy Pinnacle  Cup, [employer], 0-degree polyethylene liner, a +5 36 mm ceramic head, a 6 Depuy Triloc stem   SURGEON: Jury Caserta J    ASSISTANT:   Eric K. Barton Dubois  (present throughout entire procedure and necessary for timely completion of the procedure)   ANESTHESIA: Spinal BLOOD LOSS: 650 FLUID REPLACEMENT: 1600 crystalloid Antibiotic: 2gm ancef Tranexamic Acid: 1gm iv, 2gm topiccal COMPLICATIONS: none    INDICATIONS FOR PROCEDURE: A 46 y.o. year-old With  LEFT FEMORAL NECK FRACTURE  sustained 2 days ago through area of AVN. Patient desires urgent L total hip arthroplasty to decrease pain and increase function. The risks, benefits, and alternatives were discussed at length including but not limited to the risks of infection, bleeding, nerve injury, stiffness, blood clots, the need for revision surgery, cardiopulmonary complications, among others, and they were willing to proceed. Questions answered     PROCEDURE IN DETAIL: The patient was identified by armband,  received preoperative IV antibiotics in the holding area at [facility], taken to the operating room , appropriate anesthetic monitors  were attached and  anesthesia was induced with the patienton the gurney. The HANA boots were applied to the feet and he was then transferred to the HANA table with a peroneal post and support underneath the non-operative le, which was locked in 5 lb traction. Theoperative lower extremity was then prepped and draped in the usual sterile fashion from just above the iliac crest to the knee. And a timeout procedure was performed. We then  made a 15 cm incision along the interval at the leading edge of the tensor fascia lata of starting at 2 cm lateral to and 2 cm distal to the ASIS. Small bleeders in the skin and subcutaneous tissue identified and cauterized we dissected down to the fascia and made an incision in the fascia allowing [place] to elevate the fascia of the tensor muscle and exploited the interval between the rectus and the tensor fascia lata. A Hohmann retractor was then placed along the superior neck of the femur and a Cobra retractor along the inferior neck of the femur we teed the capsule starting out at the superior anterior aspect of the acetabulum going distally and made the T along the neck both leaflets of the T were tagged with #2 Ethibond suture. Cobra retractors were then placed along the inferior and superior neck allowing [place] to perform a standard neck cut and removed the femoral head with a power corkscrew. We then placed a right angle Hohmann retractor along the anterior aspect of the acetabulum a spiked [employer]  in the cotyloid notch and posteriorly a Muelller retractor. We then sequentially reamed up to a 54 mm basket reamer obtaining good coverage in all quadrants, verified by C-arm imaging. Under C-arm control with and hammered into place a 54 mm Pinnacle cup in 45 of abduction and 15 of anteversion. The cup seated nicely and required no supplemental screws. We then placed a central hole Eliminator and a 0 polyethylene liner. The foot was then externally rotated to 110, the HANA elevator was placed around the flare of the greater trochanter and the limb was extended and abducted delivering the proximal femur up into the wound. A medium Hohmann retractor was placed over the greater trochanter and a Mueller retractor along the posterior femoral neck completing the exposure. We then performed releases superiorly and and inferiorly of the capsule going back to the pirformis fossa superiorly and to the lesser trochanter  inferiorly. We then entered the proximal femur with the box cutting offset chisel followed by, a canal sounder, the chili pepper and broaching up to a 6 broach. This seated nicely and we reamed the calcar. A trial reduction was performed with a +55 mm 36 mm head.The limb lengths were excellent the hip was stable in 90 of external rotation. At this point the trial components removed and we hammered into place a # 6 hi Tri-Lock stem with G[employer] coating. This was a hi offset stem and a + 5 36 mm ceramic ball was then hammered into place the hip was reduced and final C-arm images obtained. The wound was thoroughly irrigated with normal saline solution. We repaired the ant capsule and the tensor fascia lot a with running 0 vicryl suture. the subcutaneous tissue was closed with 2-0 and 3-0 Vicryl suture followed by an Aquacil dressing. At this point the patient was awaken and transferred to hospital gurney without difficulty. The subcutaneous tissue with 0 and 2-0 undyed Vicryl suture and the skin with running  3-0 vicryl subcuticular suture. Aquacil dressing was applied. The patient was then unclamped, rolled supine, awaken extubated and taken to recovery room without difficulty in stable condition.   Frederik Standley J 02/17/2017, 11:40 AM

## 2017-02-17 NOTE — Anesthesia Procedure Notes (Signed)
Spinal  Patient location during procedure: OR Start time: 02/17/2017 10:27 AM End time: 02/17/2017 10:32 AM Staffing Anesthesiologist: Candida Peeling RAY Performed: anesthesiologist  Preanesthetic Checklist Completed: patient identified, site marked, surgical consent, pre-op evaluation, timeout performed, IV checked, risks and benefits discussed and monitors and equipment checked Spinal Block Patient position: sitting Prep: Betadine Patient monitoring: heart rate, cardiac monitor, continuous pulse ox and blood pressure Approach: midline Location: L3-4 Injection technique: single-shot Needle Needle type: Quincke  Needle gauge: 22 G Needle length: 9 cm

## 2017-02-17 NOTE — Transfer of Care (Signed)
Immediate Anesthesia Transfer of Care Note  Patient: [name]  Procedure(s) Performed: Procedure(s): TOTAL HIP ARTHROPLASTY ANTERIOR APPROACH (Left)  Patient Location: PACU  Anesthesia Type:Spinal  Level of Consciousness: awake, alert  and patient cooperative  Airway & Oxygen Therapy: Patient Spontanous Breathing and Patient connected to nasal cannula oxygen  Post-op Assessment: Report given to RN, Post -op Vital signs reviewed and stable and Patient able to stick tongue midline  Post vital signs: Reviewed and stable  Last Vitals:  Vitals:   02/17/17 0809  BP: 128/80  Pulse: 79  Resp: 18  Temp: 36.6 C    Last Pain:  Vitals:   02/17/17 0825  PainSc: 4       Patients Stated Pain Goal: 2 (80/99/83 3825)  Complications: No apparent anesthesia complications

## 2017-02-18 LAB — BASIC METABOLIC PANEL
Anion gap: 11 (ref 5–15)
BUN: 13 mg/dL (ref 6–20)
CO2: 22 mmol/L (ref 22–32)
CREATININE: 1.21 mg/dL (ref 0.61–1.24)
Calcium: 8.4 mg/dL — ABNORMAL LOW (ref 8.9–10.3)
Chloride: 99 mmol/L — ABNORMAL LOW (ref 101–111)
GFR calc non Af Amer: >60 mL/min (ref >60)
GLUCOSE: 151 mg/dL — AB (ref 65–99)
Potassium: 4 mmol/L (ref 3.5–5.1)
Sodium: 132 mmol/L — ABNORMAL LOW (ref 135–145)

## 2017-02-18 LAB — CBC
HEMATOCRIT: 39.1 % (ref 39.0–52.0)
Hemoglobin: 12.7 g/dL — ABNORMAL LOW (ref 13.0–17.0)
MCH: 29 pg (ref 26.0–34.0)
MCHC: 32.5 g/dL (ref 30.0–36.0)
MCV: 89.3 fL (ref 78.0–100.0)
Platelets: 316 10*3/uL (ref 150–400)
RBC: 4.38 MIL/uL (ref 4.22–5.81)
RDW: 13.8 % (ref 11.5–15.5)
WBC: 8.1 10*3/uL (ref 4.0–10.5)

## 2017-02-18 NOTE — Progress Notes (Signed)
Physical Therapy Treatment Patient Details Name: [name] MRN: [id number] DOB: [birth year] Today's Date: 02/18/2017    History of Present Illness Pt is s/p elective L THA secondary to L femoral neck fx. 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     PT Comments    Pt performed increased mobility during session.  Reviewed HEP and stair training.  Wife present and verbalized understanding.  Pt anxious to d/c home.     Follow Up Recommendations  DC plan and follow up therapy as arranged by surgeon;Supervision - Intermittent     Equipment Recommendations  None recommended by PT    Recommendations for Other Services       Precautions / Restrictions Precautions Precautions: None Precaution Comments: Reviewed and administered THA exercise handout. will need to go over seated and standing exercise with pt.  Restrictions Weight Bearing Restrictions: Yes LLE Weight Bearing: Weight bearing as tolerated    Mobility  Bed Mobility Overal bed mobility: Needs Assistance Bed Mobility: Supine to Sit     Supine to sit: Min assist     General bed mobility comments: Wife provided assistance despite patient being able to perform this task independently.    Transfers Overall transfer level: Needs assistance Equipment used: Rolling walker (2 wheeled) Transfers: Sit to/from Stand Sit to Stand: Modified independent (Device/Increase time)         General transfer comment: Good technique  Ambulation/Gait Ambulation/Gait assistance: Supervision Ambulation Distance (Feet): 500 Feet Assistive device: Rolling walker (2 wheeled) Gait Pattern/deviations: Step-through pattern;WFL(Within Functional Limits) Gait velocity: WNL    General Gait Details: Good technique, educated patient on use of RW for safety and that he can progress to cane in a week.     Stairs Stairs: Yes   Stair Management: Step to pattern;Two rails Number of Stairs: 15 General  stair comments: Pt performed stair training with cues for sequencing.    Wheelchair Mobility    Modified Rankin (Stroke Patients Only)       Balance Overall balance assessment: Needs assistance Sitting-balance support: No upper extremity supported;Feet supported Sitting balance-Leahy Scale: Normal     Standing balance support: Bilateral upper extremity supported;During functional activity Standing balance-Leahy Scale: Fair Standing balance comment: able to stand statically without UE support                            Cognition Arousal/Alertness: Awake/alert Behavior During Therapy: WFL for tasks assessed/performed Overall Cognitive Status: Within Functional Limits for tasks assessed                                 General Comments: Pt very comedic, wife reports this is his regular      Exercises Total Joint Exercises Hip ABduction/ADduction: AROM;Left;10 reps;Standing Long Arc Quad: AROM;Left;10 reps Knee Flexion: AROM;Left;10 reps;Seated Marching in Standing: AROM;Left;10 reps;Standing Standing Hip Extension: AROM;Left;Standing;10 reps    General Comments        Pertinent Vitals/Pain Pain Assessment: 0-10 Pain Score: 0-No pain    Home Living                      Prior Function            PT Goals (current goals can now be found in the care plan section) Acute Rehab PT Goals Patient Stated Goal: to go home ASAP  Potential to Achieve Goals: Good Progress towards PT goals: Progressing toward goals    Frequency    7X/week      PT Plan Current plan remains appropriate    Co-evaluation              AM-PAC PT "6 Clicks" Daily Activity  Outcome Measure  Difficulty turning over in bed (including adjusting bedclothes, sheets and blankets)?: None Difficulty moving from lying on back to sitting on the side of the bed? : None Difficulty sitting down on and standing up from a chair with arms (e.g., wheelchair,  bedside commode, etc,.)?: None Help needed moving to and from a bed to chair (including a wheelchair)?: None Help needed walking in hospital room?: None Help needed climbing 3-5 steps with a railing? : A Little 6 Click Score: 23    End of Session Equipment Utilized During Treatment: Gait belt Activity Tolerance: Patient tolerated treatment well Patient left: in bed;with call bell/phone within reach;with family/visitor present Nurse Communication: Mobility status;Patient requests pain meds PT Visit Diagnosis: Other abnormalities of gait and mobility (R26.89);Pain Pain - Right/Left: Left Pain - part of body: Hip     Time: 0929-0950 PT Time Calculation (min) (ACUTE ONLY): 21 min  Charges:  $Therapeutic Activity: 8-22 mins                    G Codes:       [name], PTA pager [phone]    [name] 02/18/2017, 10:05 AM

## 2017-02-18 NOTE — Progress Notes (Signed)
Discharge instructions given. Pt verbalized understanding and all questions were answered.  

## 2017-02-18 NOTE — Progress Notes (Signed)
Subjective: 1 Day Post-Op Procedure(s) (LRB): TOTAL HIP ARTHROPLASTY ANTERIOR APPROACH (Left) Patient reports pain as moderate.  The patient is ambulating in the hall. He is voiding okay. He is taking by mouth okay. He has had a bowel movement. He is ready to go home. Denies chest pain or shortness of breath  Objective: Vital signs in last 24 hours: Temp:  [97 F (36.1 C)-98 F (36.7 C)] 97.9 F (36.6 C) (06/02 0521) Pulse Rate:  [55-93] 86 (06/02 0521) Resp:  [4-19] 18 (06/02 0521) BP: (91-112)/(49-78) 92/52 (06/02 0521) SpO2:  [89 %-100 %] 100 % (06/02 0521)  Intake/Output from previous day: 06/01 0701 - 06/02 0700 In: 2302.1 [I.V.:2302.1] Out: 800 [Urine:150; Blood:650] Intake/Output this shift: No intake/output data recorded.   Recent Labs  02/18/17 0643  HGB 12.7*    Recent Labs  02/18/17 0643  WBC 8.1  RBC 4.38  HCT 39.1  PLT 316    Recent Labs  02/18/17 0643  NA 132*  K 4.0  CL 99*  CO2 22  BUN 13  CREATININE 1.21  GLUCOSE 151*  CALCIUM 8.4*    Recent Labs  02/16/17 1304  INR 0.98  Left hip exam:  Neurovascular intact Sensation intact distally Intact pulses distally Dorsiflexion/Plantar flexion intact Incision: dressing C/D/I Compartment soft  Assessment/Plan: 1 Day Post-Op Procedure(s) (LRB): TOTAL HIP ARTHROPLASTY ANTERIOR APPROACH (Left) Plan: Weight-bear as tolerated on the left without hip precautions. Aspirin 325 mg enteric-coated twice daily for DVT prophylaxis 1 month postop. Up with therapy Discharge home with home health Follow-up with Dr. Mayer Camel in 2 weeks.  Barbara Keng G 02/18/2017, 8:50 AM

## 2017-02-18 NOTE — Clinical Social Work Note (Signed)
CSW consulted for SNF placement. Pt to discharge home with his wife. CSW signing off as no further social work needs identified.   Oretha Ellis, Cec[place], [place] Work (Weekend) [phone]

## 2017-02-18 NOTE — Discharge Summary (Signed)
Patient ID: [name] MRN: [id number] DOB/AGE: 1971-05-25 46 y.o.  Admit date: 02/17/2017 Discharge date: 02/18/2017  Admission Diagnoses:  Principal Problem:   Fracture of femoral neck, left ([place]) Active Problems:   AVN of femur ([place])   Discharge Diagnoses:  Same  Past Medical History:  Diagnosis Date  . AVN (avascular necrosis of bone) ([place])    r femural head  . Complication of anesthesia    pt. states that he wakes up during procedures  . Pneumonia     Surgeries: Procedure(s):Left TOTAL HIP ARTHROPLASTY ANTERIOR APPROACH on 02/17/2017   Discharged Condition: Improved  Hospital Course: LEVERN PITTER is an 46 y.o. male who was admitted 02/17/2017 for operative treatment ofFracture of femoral neck, left ([place]). Patient has severe unremitting pain that affects sleep, daily activities, and work/hobbies. After pre-op clearance the patient was taken to the operating room on 02/17/2017 and underwent  Procedure(s):Left TOTAL HIP ARTHROPLASTY ANTERIOR APPROACH.    Patient was given perioperative antibiotics: Anti-infectives    Start     Dose/Rate Route Frequency Ordered Stop   02/17/17 1000  ceFAZolin (ANCEF) IVPB 2g/100 mL premix     2 g 200 mL/hr over 30 Minutes Intravenous To [facility] 02/16/17 0832 02/17/17 1017       Patient was given sequential compression devices, early ambulation, and chemoprophylaxis to prevent DVT. On the date of discharge the patient was ambulating in the hall.  He had manageable pain.Denied chest pain or shortness of breath. He had a benign dressing, was afebrile, and had pain which was managed by oral pain medication.  Patient benefited maximally from hospital stay and there were no complications.    Recent vital signs: Patient Vitals for the past 24 hrs:  BP Temp Temp src Pulse Resp SpO2  02/18/17 0521 (!) 92/52 97.9 F (36.6 C) Oral 86 18 100 %  02/18/17 0042 (!) 91/49 98 F (36.7 C) Oral 74 18 -  02/17/17 2025 110/67 98 F (36.7 C) Oral  93 18 96 %  02/17/17 1454 102/74 97 F (36.1 C) - 62 16 98 %  02/17/17 1430 - - - 69 13 99 %  02/17/17 1400 - - - 62 19 98 %  02/17/17 1353 - - - 60 (!) 4 94 %     Recent laboratory studies:  Recent Labs  02/16/17 1304 02/18/17 0643  WBC  --  8.1  HGB  --  12.7*  HCT  --  39.1  PLT  --  316  NA  --  132*  K  --  4.0  CL  --  99*  CO2  --  22  BUN  --  13  CREATININE  --  1.21  GLUCOSE  --  151*  INR 0.98  --   CALCIUM  --  8.4*     Discharge Medications:   Allergies as of 02/18/2017      Reactions   No Known Allergies       Medication List    TAKE these medications   albuterol 108 (90 Base) MCG/ACT inhaler Commonly known as:  PROVENTIL HFA;VENTOLIN HFA Inhale 2 puffs into the lungs every 6 (six) hours as needed for wheezing or shortness of breath.   albuterol (2.5 MG/3ML) 0.083% nebulizer solution Commonly known as:  PROVENTIL Take 3 mLs (2.5 mg total) by nebulization every 6 (six) hours as needed for wheezing or shortness of breath.   aspirin EC 325 MG tablet Take 1 tablet (325 mg total)  by mouth 2 (two) times daily.   cyclobenzaprine 5 MG tablet Commonly known as:  FLEXERIL Take 1 tablet (5 mg total) by mouth 3 (three) times daily as needed for muscle spasms. What changed:  when to take this   ketorolac 10 MG tablet Commonly known as:  TORADOL Take 1 tablet (10 mg total) by mouth every 6 (six) hours as needed. What changed:  when to take this  reasons to take this   levalbuterol 1.25 MG/0.5ML nebulizer solution Commonly known as:  XOPENEX Take 1.25 mg by nebulization every 4 (four) hours as needed for wheezing or shortness of breath.   levofloxacin 500 MG tablet Commonly known as:  LEVAQUIN Take 1 tablet (500 mg total) by mouth daily.   lidocaine 5 % Commonly known as:  LIDODERM APP 1 PATCH DAILY LEAVE ON FOR 12 HOURS THEN REMOVE FOR 12 HOURS   methocarbamol 500 MG tablet Commonly known as:  ROBAXIN Take 1 tablet (500 mg total) by mouth 2  (two) times daily with a meal.   oxyCODONE-acetaminophen 10-325 MG tablet Commonly known as:  PERCOCET Take 1 tablet by mouth every 6 (six) hours as needed for pain. What changed:  when to take this  reasons to take this   predniSONE 10 MG (48) Tbpk tablet Commonly known as:  STERAPRED UNI-PAK 48 TAB Use as directed   traZODone 150 MG tablet Commonly known as:  DESYREL Take one or 2 a qhs What changed:  how much to take  how to take this  when to take this  additional instructions            Durable Medical Equipment        Start     Ordered   02/17/17 1527  DME Walker rolling  Once    Question:  Patient needs a walker to treat with the following condition  Answer:  AVN of femur ([place])   02/17/17 1527   02/17/17 1527  DME 3 n 1  Once     02/17/17 1527   02/17/17 1527  DME Bedside commode  Once    Question:  Patient needs a bedside commode to treat with the following condition  Answer:  AVN of femur ([place])   02/17/17 1527      Diagnostic Studies: Dg Chest 2 View  Result Date: 01/21/2017 CLINICAL DATA:  Hypoxia on room air. Currently being treated with antibiotics and steroids. Productive cough. EXAM: CHEST  2 VIEW COMPARISON:  06/05/2016 FINDINGS: The lungs are clear. The pulmonary vasculature is normal. Heart size is normal. Hilar and mediastinal contours are unremarkable. There is no pleural effusion. IMPRESSION: No active cardiopulmonary disease. Electronically Signed   By: Andreas Newport M.D.   On: 01/21/2017 06:47   Ct Pelvis Wo Contrast  Result Date: 02/07/2017 CLINICAL DATA:  Fall with left hip pain EXAM: CT PELVIS WITHOUT CONTRAST TECHNIQUE: Multidetector CT imaging of the pelvis was performed following the standard protocol without intravenous contrast. COMPARISON:  02/07/2017, 06/24/2012 FINDINGS: Urinary Tract:  Distal ureters and bladder are unremarkable. Bowel:  Sigmoid colon diverticula without acute inflammation Vascular/Lymphatic: No significantly  enlarged pelvic lymph nodes. Minimal atherosclerotic calcification. Reproductive:  Prostate gland calcifications. Other:  No significant free fluid. Musculoskeletal: No acute displaced fracture or malalignment. Right hip replacement with intact hardware. Posterior acetabular osteophyte on the left. Pubic rami are intact. Subchondral cyst in the left femoral heads. Mild degenerative changes of the left hip. IMPRESSION: 1. No definite acute osseous abnormality. Status post right  hip replacement 2. Sigmoid colon diverticular disease without acute inflammation Electronically Signed   By: Donavan Foil M.D.   On: 02/07/2017 23:09   Dg C-arm 1-60 Min  Result Date: 02/17/2017 CLINICAL DATA:  Left femoral neck fracture. EXAM: OPERATIVE LEFT HIP (WITH PELVIS IF PERFORMED) FLUOROSCOPY TIME:  12 SECONDS C-arm fluoroscopic images were obtained intraoperatively and submitted for post operative interpretation. Please see the performing provider's procedural report for the fluoroscopy time utilized. VIEWS TECHNIQUE: Fluoroscopic spot image(s) were submitted for interpretation post-operatively. COMPARISON:  RADIOGRAPHS DATED 02/07/2017 FINDINGS: AP views demonstrate that the acetabular and femoral components of the left hip prosthesis appear in good position in the AP projection. No fractures. IMPRESSION: Satisfactory appearance of the left hip in the AP projection after total hip replacement. Electronically Signed   By: Lorriane Shire M.D.   On: 02/17/2017 12:32   Dg Hip Operative Unilat With Pelvis Left  Result Date: 02/17/2017 CLINICAL DATA:  Left femoral neck fracture. EXAM: OPERATIVE LEFT HIP (WITH PELVIS IF PERFORMED) FLUOROSCOPY TIME:  12 SECONDS C-arm fluoroscopic images were obtained intraoperatively and submitted for post operative interpretation. Please see the performing provider's procedural report for the fluoroscopy time utilized. VIEWS TECHNIQUE: Fluoroscopic spot image(s) were submitted for interpretation  post-operatively. COMPARISON:  RADIOGRAPHS DATED 02/07/2017 FINDINGS: AP views demonstrate that the acetabular and femoral components of the left hip prosthesis appear in good position in the AP projection. No fractures. IMPRESSION: Satisfactory appearance of the left hip in the AP projection after total hip replacement. Electronically Signed   By: Lorriane Shire M.D.   On: 02/17/2017 12:32   Dg Hip Unilat With Pelvis 2-3 Views Left  Result Date: 02/07/2017 CLINICAL DATA:  Fall with pain to the left hip EXAM: DG HIP (WITH OR WITHOUT PELVIS) 2-3V LEFT COMPARISON:  10/07/2011 FINDINGS: Status post right hip replacement with no dislocation. The pubic symphysis is intact. No fracture or malalignment. Mild arthritis of the left hip. IMPRESSION: 1. No definite acute osseous abnormality of the left hip 2. Status post right hip replacement without acute dislocation. Electronically Signed   By: Donavan Foil M.D.   On: 02/07/2017 21:59    Disposition: 01-Home or Self Care  Discharge Instructions    Call MD / Call 911    Complete by:  As directed    If you experience chest pain or shortness of breath, CALL 911 and be transported to the hospital emergency room.  If you develope a fever above 101 F, pus (white drainage) or increased drainage or redness at the wound, or calf pain, call your surgeon's office.   Constipation Prevention    Complete by:  As directed    Drink plenty of fluids.  Prune juice may be helpful.  You may use a stool softener, such as Colace (over the counter) 100 mg twice a day.  Use MiraLax (over the counter) for constipation as needed.   Diet general    Complete by:  As directed    Do not sit on low chairs, stoools or toilet seats, as it may be difficult to get up from low surfaces    Complete by:  As directed    Increase activity slowly as tolerated    Complete by:  As directed    Weight bearing as tolerated    Complete by:  As directed    Laterality:  left   Extremity:  Lower       Follow-up Information    Frederik Pear, MD In 2 weeks.  Specialty:  Orthopedic Surgery Contact information: [place] [id number] [phone]            Signed: Erlene Senters 02/18/2017, 1:45 PM

## 2017-02-20 ENCOUNTER — Encounter (HOSPITAL_COMMUNITY): Payer: Self-pay | Admitting: Orthopedic Surgery

## 2017-02-20 ENCOUNTER — Emergency Department
Admission: EM | Admit: 2017-02-20 | Discharge: 2017-02-20 | Disposition: A | Discharge status: Home / Self Care | Payer: Managed Care, Other (non HMO) | Attending: Emergency Medicine | Admitting: Emergency Medicine

## 2017-02-20 ENCOUNTER — Emergency Department: Payer: Managed Care, Other (non HMO)

## 2017-02-20 DIAGNOSIS — Z79899 Other long term (current) drug therapy: Secondary | ICD-10-CM | POA: Diagnosis not present

## 2017-02-20 DIAGNOSIS — Z7982 Long term (current) use of aspirin: Secondary | ICD-10-CM | POA: Diagnosis not present

## 2017-02-20 DIAGNOSIS — M25552 Pain in left hip: Secondary | ICD-10-CM | POA: Insufficient documentation

## 2017-02-20 DIAGNOSIS — F909 Attention-deficit hyperactivity disorder, unspecified type: Secondary | ICD-10-CM | POA: Diagnosis not present

## 2017-02-20 DIAGNOSIS — M79605 Pain in left leg: Secondary | ICD-10-CM

## 2017-02-20 MED ORDER — ONDANSETRON 4 MG PO TBDP
4.0000 mg | ORAL_TABLET | Freq: Once | ORAL | Status: AC
  Administered 2017-02-20: 4 mg via ORAL
  Filled 2017-02-20: qty 1

## 2017-02-20 MED ORDER — ONDANSETRON 4 MG PO TBDP: 4.0000 mg | ORAL_TABLET | Freq: Three times a day (TID) | ORAL | 0 refills | Status: DC | PRN

## 2017-02-20 NOTE — ED Notes (Signed)
Patient up to restroom with one assist and use of walker.

## 2017-02-20 NOTE — ED Notes (Signed)
Patient ambulatory to restroom with use of walker. Able to get into and out of bed without assistance.

## 2017-02-20 NOTE — ED Notes (Signed)
Patient reports left hip replacement last week. States today when standing to get out of bed, he heard a loud pop. Now reporting pain to back of left thigh. Patient able to turn self in bed and bear weight with assistance of walker.

## 2017-02-20 NOTE — ED Triage Notes (Signed)
Had left hip replacement done on Friday.  While getting out of bed this morning, felt a "pop" mid left thigh.  Has been taking percocet and dilaudid, and pain not relieved.

## 2017-02-20 NOTE — ED Provider Notes (Signed)
[facility] Emergency Department Provider Note       Time seen: ----------------------------------------- 5:42 PM on 02/20/2017 -----------------------------------------     I have reviewed the triage vital signs and the nursing notes.   HISTORY   Chief Complaint Leg Pain    HPI [name] is a 46 y.o. male who presents to the ED for left hip pain. Patient reports had left hip replacement done on Friday. While getting out of bed this morning he felt a pop in the left hip posteriorly. He's been taking Percocet and Dilaudid without any pain improvement. He also reports some nausea currently.   Past Medical History:  Diagnosis Date  . AVN (avascular necrosis of bone) ([place])    r femural head  . Complication of anesthesia    pt. states that he wakes up during procedures  . Pneumonia     Patient Active Problem List   Diagnosis Date Noted  . 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 Fracture of femoral neck, left ([place]) 02/14/2017  . Hyperlipemia, mixed 10/22/2015  . Adult ADHD 07/17/2015  . Kidney stones 07/17/2015  . 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 COLONOSCOPY N/A 04/01/2015   Procedure: COLONOSCOPY;  Surgeon: [name], MD;  Location: [place];  Service: Gastroenterology;  Laterality: N/A;  . GANGLION CYST EXCISION    . POLYPECTOMY  04/01/2015   Procedure: POLYPECTOMY;  Surgeon: [name], MD;  Location: [place];  Service: Gastroenterology;;  . TONGUE FLAP RELEASE    . TOTAL HIP ARTHROPLASTY  10/07/2011   Procedure: TOTAL HIP ARTHROPLASTY;  Surgeon: Kerin Salen, MD;  Location: [place];  Service: Orthopedics;  Laterality: Right;  . TOTAL HIP ARTHROPLASTY Left 02/17/2017   Procedure: TOTAL HIP ARTHROPLASTY ANTERIOR APPROACH;  Surgeon: Frederik Pear, MD;  Location: [place];  Service: Orthopedics;  Laterality: Left;    Allergies No known  allergies  Social History Social History  Substance Use Topics  . Smoking status: Never Smoker  . Smokeless tobacco: Never Used  . Alcohol use No    Review of Systems Constitutional: Negative for fever. Cardiovascular: Negative for chest pain. Respiratory: Negative for shortness of breath. Gastrointestinal: Negative for abdominal pain, Positive for nausea Musculoskeletal: Positive for left hip and upper leg pain Skin: Negative for rash. Neurological: Negative for headaches, focal weakness or numbness.  All systems negative/normal/unremarkable except as stated in the HPI  ____________________________________________   PHYSICAL EXAM:  VITAL SIGNS: ED Triage Vitals  Enc Vitals Group     BP 02/20/17 1641 (!) 156/95     Pulse Rate 02/20/17 1641 (!) 125     Resp 02/20/17 1641 18     Temp 02/20/17 1641 99.1 F (37.3 C)     Temp Source 02/20/17 1641 Oral     SpO2 02/20/17 1641 100 %     Weight 02/20/17 1641 250 lb (113.4 kg)     Height 02/20/17 1641 6\' 2"  (1.88 m)     Head Circumference --      Peak Flow --      Pain Score 02/20/17 1639 10     Pain Loc --      Pain Edu? --      Excl. in [place]? --     Constitutional: Alert and oriented. Mild distress. Patient appears somewhat intoxicated likely from pain medicine Eyes: Conjunctivae are normal. Normal extraocular movements. Respiratory: Normal respiratory effort without  tachypnea nor retractions. Breath sounds are clear and equal bilaterally. No wheezes/rales/rhonchi. Musculoskeletal: Patient appears to have good range of motion of the left hip. Incision sites over the left hip appear normal. Patient points to pain high in his left hamstring area Neurologic:  Normal speech and language. No gross focal neurologic deficits are appreciated.  Skin:  Skin is warm, dry and intact. No rash noted. Psychiatric: Mood and affect are normal. Speech and behavior are normal.  ____________________________________________  ED  COURSE:  Pertinent labs & imaging results that were available during my care of the patient were reviewed by me and considered in my medical decision making (see chart for details). Patient presents for left hip and buttock pain, we will assess with labs and imaging as indicated.   Procedures ____________________________________________   RADIOLOGY Images were viewed by me  Left hip pain-CT left hip IMPRESSION: 1. No evidence of fracture or other complicating feature after total hip replacement. 2. Expected degree of postsurgical fluid in the soft tissues at the site of surgery. ____________________________________________  FINAL ASSESSMENT AND PLAN  Hip pain  Plan: Patient's labs and imaging were dictated above. Patient had presented for hip pain which appears to be muscular in origin. X-ray and CT findings do not reveal any acute process. Nausea could be coming from his pain medication. Overall he is stable for outpatient follow-up with his orthopedist.   [name], MD   Note: This note was generated in part or whole with voice recognition software. Voice recognition is usually quite accurate but there are transcription errors that can and very often do occur. I apologize for any typographical errors that were not detected and corrected.     [name], MD 02/20/17 [phone]

## 2017-04-10 ENCOUNTER — Other Ambulatory Visit: Payer: Self-pay | Admitting: Physician Assistant

## 2017-04-10 DIAGNOSIS — Z299 Encounter for prophylactic measures, unspecified: Secondary | ICD-10-CM

## 2017-04-10 NOTE — Progress Notes (Signed)
Patient came in to have blood drawn for testing per Dr. Arta Bruce orders.

## 2017-04-11 LAB — HGB A1C W/O EAG: HEMOGLOBIN A1C: 5.3 % (ref 4.8–5.6)

## 2017-04-11 LAB — URINALYSIS, ROUTINE W REFLEX MICROSCOPIC
Bilirubin, UA: NEGATIVE
Glucose, UA: NEGATIVE
KETONES UA: NEGATIVE
LEUKOCYTES UA: NEGATIVE
NITRITE UA: NEGATIVE
Protein, UA: NEGATIVE
RBC, UA: NEGATIVE
SPEC GRAV UA: 1.021 (ref 1.005–1.030)
Urobilinogen, Ur: 0.2 mg/dL (ref 0.2–1.0)
pH, UA: 5 (ref 5.0–7.5)

## 2017-04-11 LAB — CMP12+LP+TP+TSH+6AC+PSA+CBC…
ALBUMIN: 4.4 g/dL (ref 3.5–5.5)
ALT: 22 IU/L (ref 0–44)
AST: 14 IU/L (ref 0–40)
Albumin/Globulin Ratio: 2 (ref 1.2–2.2)
Alkaline Phosphatase: 115 IU/L (ref 39–117)
BASOS ABS: 0 10*3/uL (ref 0.0–0.2)
BASOS: 0 %
BUN / CREAT RATIO: 13 (ref 9–20)
BUN: 15 mg/dL (ref 6–24)
Bilirubin Total: 0.3 mg/dL (ref 0.0–1.2)
CALCIUM: 9.1 mg/dL (ref 8.7–10.2)
CHLORIDE: 105 mmol/L (ref 96–106)
CHOLESTEROL TOTAL: 179 mg/dL (ref 100–199)
Chol/HDL Ratio: 4.4 ratio (ref 0.0–5.0)
Creatinine, Ser: 1.14 mg/dL (ref 0.76–1.27)
EOS (ABSOLUTE): 0.1 10*3/uL (ref 0.0–0.4)
EOS: 2 %
ESTIMATED CHD RISK: 0.9 times avg. (ref 0.0–1.0)
FREE THYROXINE INDEX: 1.9 (ref 1.2–4.9)
GFR calc Af Amer: 88 mL/min/{1.73_m2} (ref >59)
GFR calc non Af Amer: 76 mL/min/{1.73_m2} (ref >59)
GGT: 48 IU/L (ref 0–65)
GLOBULIN, TOTAL: 2.2 g/dL (ref 1.5–4.5)
Glucose: 102 mg/dL — ABNORMAL HIGH (ref 65–99)
HDL: 41 mg/dL (ref >39)
HEMOGLOBIN: 14 g/dL (ref 13.0–17.7)
Hematocrit: 43.2 % (ref 37.5–51.0)
Immature Grans (Abs): 0 10*3/uL (ref 0.0–0.1)
Immature Granulocytes: 0 %
Iron: 72 ug/dL (ref 38–169)
LDH: 157 IU/L (ref 121–224)
LDL Calculated: 115 mg/dL — ABNORMAL HIGH (ref 0–99)
Lymphocytes Absolute: 1.8 10*3/uL (ref 0.7–3.1)
Lymphs: 33 %
MCH: 28.9 pg (ref 26.6–33.0)
MCHC: 32.4 g/dL (ref 31.5–35.7)
MCV: 89 fL (ref 79–97)
MONOS ABS: 0.3 10*3/uL (ref 0.1–0.9)
Monocytes: 5 %
NEUTROS ABS: 3.3 10*3/uL (ref 1.4–7.0)
Neutrophils: 60 %
PHOSPHORUS: 2.7 mg/dL (ref 2.5–4.5)
PLATELETS: 343 10*3/uL (ref 150–379)
PROSTATE SPECIFIC AG, SERUM: 0.4 ng/mL (ref 0.0–4.0)
Potassium: 3.9 mmol/L (ref 3.5–5.2)
RBC: 4.85 x10E6/uL (ref 4.14–5.80)
RDW: 14.8 % (ref 12.3–15.4)
SODIUM: 143 mmol/L (ref 134–144)
T3 Uptake Ratio: 21 % — ABNORMAL LOW (ref 24–39)
T4 TOTAL: 9.2 ug/dL (ref 4.5–12.0)
TOTAL PROTEIN: 6.6 g/dL (ref 6.0–8.5)
TRIGLYCERIDES: 117 mg/dL (ref 0–149)
TSH: 2.3 u[IU]/mL (ref 0.450–4.500)
Uric Acid: 8.5 mg/dL (ref 3.7–8.6)
VLDL Cholesterol Cal: 23 mg/dL (ref 5–40)
WBC: 5.5 10*3/uL (ref 3.4–10.8)

## 2017-06-24 ENCOUNTER — Emergency Department: Payer: Managed Care, Other (non HMO)

## 2017-06-24 ENCOUNTER — Encounter: Payer: Self-pay | Admitting: Emergency Medicine

## 2017-06-24 ENCOUNTER — Emergency Department
Admission: EM | Admit: 2017-06-24 | Discharge: 2017-06-24 | Disposition: A | Discharge status: Home / Self Care | Payer: Managed Care, Other (non HMO) | Attending: Emergency Medicine | Admitting: Emergency Medicine

## 2017-06-24 DIAGNOSIS — Z79899 Other long term (current) drug therapy: Secondary | ICD-10-CM | POA: Insufficient documentation

## 2017-06-24 DIAGNOSIS — R1012 Left upper quadrant pain: Secondary | ICD-10-CM | POA: Diagnosis present

## 2017-06-24 DIAGNOSIS — Z7982 Long term (current) use of aspirin: Secondary | ICD-10-CM | POA: Insufficient documentation

## 2017-06-24 DIAGNOSIS — Z96643 Presence of artificial hip joint, bilateral: Secondary | ICD-10-CM | POA: Diagnosis not present

## 2017-06-24 DIAGNOSIS — N201 Calculus of ureter: Secondary | ICD-10-CM | POA: Insufficient documentation

## 2017-06-24 LAB — COMPREHENSIVE METABOLIC PANEL
ALK PHOS: 89 U/L (ref 38–126)
ALT: 31 U/L (ref 17–63)
ANION GAP: 10 (ref 5–15)
AST: 25 U/L (ref 15–41)
Albumin: 3.7 g/dL (ref 3.5–5.0)
BILIRUBIN TOTAL: 0.3 mg/dL (ref 0.3–1.2)
BUN: 17 mg/dL (ref 6–20)
CALCIUM: 8.8 mg/dL — AB (ref 8.9–10.3)
CO2: 25 mmol/L (ref 22–32)
Chloride: 103 mmol/L (ref 101–111)
Creatinine, Ser: 1.25 mg/dL — ABNORMAL HIGH (ref 0.61–1.24)
GLUCOSE: 141 mg/dL — AB (ref 65–99)
POTASSIUM: 3.7 mmol/L (ref 3.5–5.1)
Sodium: 138 mmol/L (ref 135–145)
TOTAL PROTEIN: 6.8 g/dL (ref 6.5–8.1)

## 2017-06-24 LAB — URINALYSIS, COMPLETE (UACMP) WITH MICROSCOPIC
Bacteria, UA: NONE SEEN
Bilirubin Urine: NEGATIVE
GLUCOSE, UA: NEGATIVE mg/dL
Ketones, ur: NEGATIVE mg/dL
Leukocytes, UA: NEGATIVE
Nitrite: NEGATIVE
PROTEIN: NEGATIVE mg/dL
SPECIFIC GRAVITY, URINE: 1.01 (ref 1.005–1.030)
Squamous Epithelial / LPF: NONE SEEN
pH: 6 (ref 5.0–8.0)

## 2017-06-24 LAB — CBC
HCT: 41.7 % (ref 40.0–52.0)
HEMOGLOBIN: 14.3 g/dL (ref 13.0–18.0)
MCH: 28.4 pg (ref 26.0–34.0)
MCHC: 34.4 g/dL (ref 32.0–36.0)
MCV: 82.4 fL (ref 80.0–100.0)
Platelets: 261 10*3/uL (ref 150–440)
RBC: 5.06 MIL/uL (ref 4.40–5.90)
RDW: 14.4 % (ref 11.5–14.5)
WBC: 9 10*3/uL (ref 3.8–10.6)

## 2017-06-24 LAB — LIPASE, BLOOD: LIPASE: 35 U/L (ref 11–51)

## 2017-06-24 MED ORDER — SODIUM CHLORIDE 0.9 % IV SOLN
1000.0000 mL | Freq: Once | INTRAVENOUS | Status: AC
  Administered 2017-06-24: 1000 mL via INTRAVENOUS

## 2017-06-24 MED ORDER — KETOROLAC TROMETHAMINE 30 MG/ML IJ SOLN
30.0000 mg | Freq: Once | INTRAMUSCULAR | Status: AC
  Administered 2017-06-24: 30 mg via INTRAVENOUS
  Filled 2017-06-24: qty 1

## 2017-06-24 MED ORDER — KETOROLAC TROMETHAMINE 10 MG PO TABS: 10.0000 mg | ORAL_TABLET | Freq: Four times a day (QID) | ORAL | 0 refills | Status: DC | PRN

## 2017-06-24 MED ORDER — ONDANSETRON HCL 4 MG/2ML IJ SOLN
4.0000 mg | Freq: Once | INTRAMUSCULAR | Status: AC
  Administered 2017-06-24: 4 mg via INTRAVENOUS
  Filled 2017-06-24: qty 2

## 2017-06-24 NOTE — ED Triage Notes (Signed)
Pt to ED c/o left flank pain that started this morning around 0800. Pt states that he has taken Toradol 10 mg, Zofran 4 mg, and Phenergan 25 mg, and flomax.

## 2017-06-24 NOTE — ED Notes (Signed)
Pt reports sharp pains have stopped, describes pain as dull. Pt does not want anymore pain medicine at this time. Will continue to assess pts pain.

## 2017-06-24 NOTE — ED Provider Notes (Signed)
[facility] Emergency Department Provider Note   ____________________________________________    I have reviewed the triage vital signs and the nursing notes.   HISTORY  Chief Complaint Flank Pain     HPI Kenneth COINER is a 46 y.o. male Who presents with complaints of flank pain. Patient complains of left flank pain secondary to kidney stone. Patient reports he took Toradol this morning as well as Zofran and Phenergan and Flomax. He reports the pain is sharp and radiating towards his groin. He has vomited twice this morning. He has a history of kidney stones and this feels the same. No fevers or chills. No dysuria.   Past Medical History:  Diagnosis Date  . 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 Complication of anesthesia    pt. states that he wakes up during procedures  . Pneumonia     Patient Active Problem List   Diagnosis Date Noted  . 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 Fracture of femoral neck, left ([place]) 02/14/2017  . Hyperlipemia, mixed 10/22/2015  . Adult ADHD 07/17/2015  . Kidney stones 07/17/2015  . 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 COLONOSCOPY N/A 04/01/2015   Procedure: COLONOSCOPY;  Surgeon: [name], MD;  Location: [place];  Service: Gastroenterology;  Laterality: N/A;  . GANGLION CYST EXCISION    . POLYPECTOMY  04/01/2015   Procedure: POLYPECTOMY;  Surgeon: [name], MD;  Location: [place];  Service: Gastroenterology;;  . TONGUE FLAP RELEASE    . TOTAL HIP ARTHROPLASTY  10/07/2011   Procedure: TOTAL HIP ARTHROPLASTY;  Surgeon: Kerin Salen, MD;  Location: [place];  Service: Orthopedics;  Laterality: Right;  . TOTAL HIP ARTHROPLASTY Left 02/17/2017   Procedure: TOTAL HIP ARTHROPLASTY ANTERIOR APPROACH;  Surgeon: Frederik Pear, MD;  Location: [place];  Service: Orthopedics;  Laterality: Left;    Prior to  Admission medications   Medication Sig Start Date End Date Taking? Authorizing Provider  albuterol (PROVENTIL HFA;VENTOLIN HFA) 108 (90 Base) MCG/ACT inhaler Inhale 2 puffs into the lungs every 6 (six) hours as needed for wheezing or shortness of breath. 12/15/15  Yes Fisher, Linden Dolin, PA-C  albuterol (PROVENTIL) (2.5 MG/3ML) 0.083% nebulizer solution Take 3 mLs (2.5 mg total) by nebulization every 6 (six) hours as needed for wheezing or shortness of breath. 01/21/17  Yes Loney Hering, MD  aspirin EC 325 MG tablet Take 1 tablet (325 mg total) by mouth 2 (two) times daily. 02/17/17  Yes Leighton Parody, PA-C  cyclobenzaprine (FLEXERIL) 5 MG tablet Take 1 tablet (5 mg total) by mouth 3 (three) times daily as needed for muscle spasms. Patient taking differently: Take 5 mg by mouth at bedtime.  02/08/17  Yes Merlyn Lot, MD  levalbuterol Penne Lash) 1.25 MG/0.5ML nebulizer solution Take 1.25 mg by nebulization every 4 (four) hours as needed for wheezing or shortness of breath. 01/21/17 01/21/18 Yes Lisa Roca, MD  ondansetron (ZOFRAN ODT) 4 MG disintegrating tablet Take 1 tablet (4 mg total) by mouth every 8 (eight) hours as needed for nausea or vomiting. 02/20/17  Yes [name], MD  promethazine (PHENERGAN) 25 MG tablet Take 25 mg by mouth every 6 (six) hours as needed for nausea. 04/04/17  Yes [provider]  traZODone (DESYREL) 150 MG tablet Take one or 2 a qhs Patient taking differently: Take 150 mg  by mouth at bedtime.  01/13/17  Yes Fisher, Linden Dolin, PA-C  ketorolac (TORADOL) 10 MG tablet Take 1 tablet (10 mg total) by mouth every 6 (six) hours as needed. 06/24/17   Lavonia Drafts, MD  levofloxacin (LEVAQUIN) 500 MG tablet Take 1 tablet (500 mg total) by mouth daily. Patient not taking: Reported on 02/15/2017 01/20/17   Versie Starks, PA-C  methocarbamol (ROBAXIN) 500 MG tablet Take 1 tablet (500 mg total) by mouth 2 (two) times daily with a meal. Patient not taking: Reported on  06/24/2017 02/17/17   Leighton Parody, PA-C  oxyCODONE-acetaminophen (PERCOCET) 10-325 MG tablet Take 1 tablet by mouth every 6 (six) hours as needed for pain. Patient not taking: Reported on 06/24/2017 02/17/17 02/17/18  Leighton Parody, PA-C  predniSONE (STERAPRED UNI-PAK 48 TAB) 10 MG (48) TBPK tablet Use as directed Patient not taking: Reported on 02/15/2017 01/20/17   Versie Starks, PA-C     Allergies No known allergies  Family History  Problem Relation Age of Onset  . Cancer Mother   . Cancer Sister   . Colon cancer Sister     Social History Social History  Substance Use Topics  . Smoking status: Never Smoker  . Smokeless tobacco: Never Used  . Alcohol use No    Review of Systems  Constitutional: No fever/chills Eyes: No visual changes.  ENT: No neck pain Cardiovascular: Denies chest pain. Respiratory: no cough Gastrointestinal: as above Genitourinary: Negative for dysuria. Musculoskeletal: Negative for back pain. Skin: Negative for rash. Neurological: Negative for headaches    ____________________________________________   PHYSICAL EXAM:  VITAL SIGNS: ED Triage Vitals  Enc Vitals Group     BP 06/24/17 1131 (!) 143/86     Pulse Rate 06/24/17 1131 94     Resp --      Temp 06/24/17 1131 97.9 F (36.6 C)     Temp Source 06/24/17 1131 Oral     SpO2 06/24/17 1131 95 %     Weight 06/24/17 1132 115.2 kg (254 lb)     Height 06/24/17 1132 1.88 m (6\' 2" )     Head Circumference --      Peak Flow --      Pain Score 06/24/17 1132 2     Pain Loc --      Pain Edu? --      Excl. in [place]? --     Constitutional: Alert and oriented. No acute distress. Pleasant and interactive Eyes: Conjunctivae are normal.    Mouth/Throat: Mucous membranes are moist.    Cardiovascular: Normal rate, regular rhythm.  Good peripheral circulation. Respiratory: Normal respiratory effort.  No retractions.  Gastrointestinal: Soft and nontender. No distention.  Genitourinary:  deferred Musculoskeletal:   Warm and well perfused Neurologic:  Normal speech and language. No gross focal neurologic deficits are appreciated.  Skin:  Skin is warm, dry and intact. No rash noted. Psychiatric: Mood and affect are normal. Speech and behavior are normal.  ____________________________________________   LABS (all labs ordered are listed, but only abnormal results are displayed)  Labs Reviewed  URINALYSIS, COMPLETE (UACMP) WITH MICROSCOPIC - Abnormal; Notable for the following:       Result Value   Color, Urine STRAW (*)    APPearance CLEAR (*)    Hgb urine dipstick MODERATE (*)    All other components within normal limits  COMPREHENSIVE METABOLIC PANEL - Abnormal; Notable for the following:    Glucose, Bld 141 (*)    Creatinine, Ser 1.25 (*)  Calcium 8.8 (*)    All other components within normal limits  CBC  LIPASE, BLOOD   ____________________________________________  EKG  None ____________________________________________  RADIOLOGY  KUB unremarkable ____________________________________________   PROCEDURES  Procedure(s) performed: No    Critical Care performed: No ____________________________________________   INITIAL IMPRESSION / ASSESSMENT AND PLAN / ED COURSE  Pertinent labs & imaging results that were available during my care of the patient were reviewed by me and considered in my medical decision making (see chart for details).  Patient presents with complaints of left flank pain as noted above.  Differential diagnosis includes ureterolithiasis, pancreatitis, musculoskeletal pain, UTI  We will treat with IV Toradol, IV Zofran and IV fluids, obtain kub and reevaluate.   ----------------------------------------- 1:51 PM on 06/24/2017 -----------------------------------------  Patient reports the pain is significant only better. Lab work is overall reassuring. Unable to see stone on KUB but given blood and urine and discretion of  pain and history consistent with kidney stone    ____________________________________________   FINAL CLINICAL IMPRESSION(S) / ED DIAGNOSES  Final diagnoses:  Ureterolithiasis      NEW MEDICATIONS STARTED DURING THIS VISIT:  New Prescriptions   KETOROLAC (TORADOL) 10 MG TABLET    Take 1 tablet (10 mg total) by mouth every 6 (six) hours as needed.     Note:  This document was prepared using Dragon voice recognition software and may include unintentional dictation errors.    Lavonia Drafts, MD 06/24/17 1351

## 2017-08-14 ENCOUNTER — Ambulatory Visit: Payer: Self-pay | Admitting: Physician Assistant

## 2017-08-14 ENCOUNTER — Encounter: Payer: Self-pay | Admitting: Physician Assistant

## 2017-08-14 VITALS — BP 145/80 | HR 95 | Temp 98.3°F | Resp 16

## 2017-08-14 DIAGNOSIS — J0120 Acute ethmoidal sinusitis, unspecified: Secondary | ICD-10-CM

## 2017-08-14 MED ORDER — FEXOFENADINE-PSEUDOEPHED ER 60-120 MG PO TB12: 1.0000 | ORAL_TABLET | Freq: Two times a day (BID) | ORAL | 0 refills | Status: DC

## 2017-08-14 MED ORDER — CLARITHROMYCIN 500 MG PO TABS: 500.0000 mg | ORAL_TABLET | Freq: Two times a day (BID) | ORAL | 0 refills | Status: DC

## 2017-08-14 MED ORDER — BENZONATATE 200 MG PO CAPS: 200.0000 mg | ORAL_CAPSULE | Freq: Two times a day (BID) | ORAL | 0 refills | Status: DC | PRN

## 2017-08-14 NOTE — Progress Notes (Signed)
   Subjective: Sinus congestion     Patient ID: [name], male    DOB: [birth year], 46 y.o.   MRN: [id number]  HPI Patient complaining of 2 weeks of sinus congestion. Patient also states ear pressure and frontal headache. Patient states nonproductive cough which is worse at night when going to bed.   Review of Systems Negative except for complaint    Objective:   Physical Exam 8 ENT remarkable for edematous nasal turbinates with thick greenish yellowish rhinorrhea. Postnasal drainage and erythematous pharynx. Neck is supple without adenopathy. Lungs clear to auscultation. Heart regular rate and rhythm.      Assessment & Plan: Sinusitis   Biaxin, Allegra-D, Tessalon Perles. Advised to follow up PCP if no improvement in one week.

## 2017-09-25 ENCOUNTER — Encounter: Payer: Self-pay | Admitting: Emergency Medicine

## 2017-09-25 ENCOUNTER — Emergency Department
Admission: EM | Admit: 2017-09-25 | Discharge: 2017-09-25 | Disposition: A | Discharge status: Home / Self Care | Payer: Managed Care, Other (non HMO) | Attending: Emergency Medicine | Admitting: Emergency Medicine

## 2017-09-25 ENCOUNTER — Other Ambulatory Visit: Payer: Self-pay

## 2017-09-25 ENCOUNTER — Emergency Department: Payer: Managed Care, Other (non HMO)

## 2017-09-25 DIAGNOSIS — Z7982 Long term (current) use of aspirin: Secondary | ICD-10-CM | POA: Diagnosis not present

## 2017-09-25 DIAGNOSIS — J111 Influenza due to unidentified influenza virus with other respiratory manifestations: Secondary | ICD-10-CM

## 2017-09-25 DIAGNOSIS — Z79899 Other long term (current) drug therapy: Secondary | ICD-10-CM | POA: Diagnosis not present

## 2017-09-25 DIAGNOSIS — J40 Bronchitis, not specified as acute or chronic: Secondary | ICD-10-CM | POA: Diagnosis not present

## 2017-09-25 DIAGNOSIS — R69 Illness, unspecified: Secondary | ICD-10-CM | POA: Insufficient documentation

## 2017-09-25 DIAGNOSIS — R05 Cough: Secondary | ICD-10-CM | POA: Diagnosis present

## 2017-09-25 LAB — CBC WITH DIFFERENTIAL/PLATELET
BASOS ABS: 0 10*3/uL (ref 0–0.1)
Basophils Relative: 1 %
EOS ABS: 0.1 10*3/uL (ref 0–0.7)
Eosinophils Relative: 2 %
HCT: 41.9 % (ref 40.0–52.0)
HEMOGLOBIN: 14.2 g/dL (ref 13.0–18.0)
LYMPHS ABS: 1.4 10*3/uL (ref 1.0–3.6)
Lymphocytes Relative: 25 %
MCH: 29.6 pg (ref 26.0–34.0)
MCHC: 34 g/dL (ref 32.0–36.0)
MCV: 86.8 fL (ref 80.0–100.0)
Monocytes Absolute: 0.7 10*3/uL (ref 0.2–1.0)
Monocytes Relative: 12 %
NEUTROS PCT: 60 %
Neutro Abs: 3.4 10*3/uL (ref 1.4–6.5)
Platelets: 254 10*3/uL (ref 150–440)
RBC: 4.82 MIL/uL (ref 4.40–5.90)
RDW: 14.7 % — ABNORMAL HIGH (ref 11.5–14.5)
WBC: 5.6 10*3/uL (ref 3.8–10.6)

## 2017-09-25 LAB — COMPREHENSIVE METABOLIC PANEL
ALT: 32 U/L (ref 17–63)
AST: 29 U/L (ref 15–41)
Albumin: 3.9 g/dL (ref 3.5–5.0)
Alkaline Phosphatase: 97 U/L (ref 38–126)
Anion gap: 8 (ref 5–15)
BUN: 12 mg/dL (ref 6–20)
CHLORIDE: 105 mmol/L (ref 101–111)
CO2: 26 mmol/L (ref 22–32)
Calcium: 8.8 mg/dL — ABNORMAL LOW (ref 8.9–10.3)
Creatinine, Ser: 1.1 mg/dL (ref 0.61–1.24)
Glucose, Bld: 122 mg/dL — ABNORMAL HIGH (ref 65–99)
POTASSIUM: 3.5 mmol/L (ref 3.5–5.1)
Sodium: 139 mmol/L (ref 135–145)
TOTAL PROTEIN: 6.9 g/dL (ref 6.5–8.1)
Total Bilirubin: 0.5 mg/dL (ref 0.3–1.2)

## 2017-09-25 LAB — INFLUENZA PANEL BY PCR (TYPE A & B)
INFLBPCR: NEGATIVE
Influenza A By PCR: NEGATIVE

## 2017-09-25 MED ORDER — ONDANSETRON 4 MG PO TBDP: 4.0000 mg | ORAL_TABLET | Freq: Three times a day (TID) | ORAL | 0 refills | Status: DC | PRN

## 2017-09-25 MED ORDER — METHYLPREDNISOLONE SODIUM SUCC 125 MG IJ SOLR
125.0000 mg | Freq: Once | INTRAMUSCULAR | Status: AC
  Administered 2017-09-25: 125 mg via INTRAVENOUS
  Filled 2017-09-25: qty 2

## 2017-09-25 MED ORDER — PREDNISONE 10 MG PO TABS: 10.0000 mg | ORAL_TABLET | Freq: Every day | ORAL | 0 refills | Status: DC

## 2017-09-25 MED ORDER — IPRATROPIUM-ALBUTEROL 0.5-2.5 (3) MG/3ML IN SOLN
6.0000 mL | Freq: Once | RESPIRATORY_TRACT | Status: AC
Start: 2017-09-25 — End: 2017-09-25
  Administered 2017-09-25: 6 mL via RESPIRATORY_TRACT
  Filled 2017-09-25: qty 6

## 2017-09-25 MED ORDER — PSEUDOEPH-BROMPHEN-DM 30-2-10 MG/5ML PO SYRP: 10.0000 mL | ORAL_SOLUTION | Freq: Four times a day (QID) | ORAL | 0 refills | Status: DC | PRN

## 2017-09-25 MED ORDER — SODIUM CHLORIDE 0.9 % IV BOLUS (SEPSIS)
1000.0000 mL | Freq: Once | INTRAVENOUS | Status: AC
  Administered 2017-09-25: 1000 mL via INTRAVENOUS

## 2017-09-25 NOTE — ED Triage Notes (Signed)
Cough x 2 days. Ran out of inhaler today.

## 2017-09-25 NOTE — ED Provider Notes (Signed)
[facility] Emergency Department Provider Note  ____________________________________________  Time seen: Approximately 5:47 PM  I have reviewed the triage vital signs and the nursing notes.   HISTORY  Chief Complaint Cough    HPI [name] is a 47 y.o. male who presents the emergency department with a complaint of sudden onset fevers and chills, body aches, nasal congestion, sore throat, coughing, diarrhea.  Patient reports that symptoms began 2 days ago with primarily body aches, nasal congestion, cough.  Patient reports that he has also developed diarrhea over the last 24 hours.  His main complaint at this time is coughing, wheezing, shortness of breath.  Patient had a albuterol inhaler from a previous URI infection, and is tried using same 6 times today without relief of shortness of breath or audible wheeze no history of asthma, COPD, emphysema.  No chronic medical problems.  Patient has tried multiple over-the-counter flu and cold medications including Mucinex, cough syrups, Tylenol, Motrin.  Patient is able to eat and drink though his appetite is decreased.  Patient denies any headache, visual changes, neck pain or stiffness, chest pain, abdominal pain, constipation.  No hematemesis or hematochezia.  Patient has had no nausea or vomiting at this time.    Past Medical History:  Diagnosis Date  . AVN (avascular necrosis of bone) ([place])    r femural head  . Complication of anesthesia    pt. states that he wakes up during procedures  . Pneumonia     Patient Active Problem List   Diagnosis Date Noted  . AVN of femur ([place]) 02/17/2017  . Fracture of femoral neck, left ([place]) 02/14/2017  . Hyperlipemia, mixed 10/22/2015  . Adult ADHD 07/17/2015  . Kidney stones 07/17/2015  . 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 COLONOSCOPY N/A 04/01/2015   Procedure: COLONOSCOPY;  Surgeon:  [name], MD;  Location: [place];  Service: Gastroenterology;  Laterality: N/A;  . GANGLION CYST EXCISION    . POLYPECTOMY  04/01/2015   Procedure: POLYPECTOMY;  Surgeon: [name], MD;  Location: [place];  Service: Gastroenterology;;  . TONGUE FLAP RELEASE    . TOTAL HIP ARTHROPLASTY  10/07/2011   Procedure: TOTAL HIP ARTHROPLASTY;  Surgeon: Kerin Salen, MD;  Location: [place];  Service: Orthopedics;  Laterality: Right;  . TOTAL HIP ARTHROPLASTY Left 02/17/2017   Procedure: TOTAL HIP ARTHROPLASTY ANTERIOR APPROACH;  Surgeon: Frederik Pear, MD;  Location: [place];  Service: Orthopedics;  Laterality: Left;    Prior to Admission medications   Medication Sig Start Date End Date Taking? Authorizing Provider  albuterol (PROVENTIL HFA;VENTOLIN HFA) 108 (90 Base) MCG/ACT inhaler Inhale 2 puffs into the lungs every 6 (six) hours as needed for wheezing or shortness of breath. 12/15/15   Fisher, Linden Dolin, PA-C  albuterol (PROVENTIL) (2.5 MG/3ML) 0.083% nebulizer solution Take 3 mLs (2.5 mg total) by nebulization every 6 (six) hours as needed for wheezing or shortness of breath. 01/21/17   Loney Hering, MD  aspirin EC 325 MG tablet Take 1 tablet (325 mg total) by mouth 2 (two) times daily. 02/17/17   Leighton Parody, PA-C  benzonatate (TESSALON) 200 MG capsule Take 1 capsule (200 mg total) by mouth 2 (two) times daily as needed for cough. 08/14/17   Sable Feil, PA-C  brompheniramine-pseudoephedrine-DM 30-2-10 MG/5ML syrup Take 10 mLs by mouth 4 (four) times daily as needed. 09/25/17  Cuthriell, Charline Bills, PA-C  clarithromycin (BIAXIN) 500 MG tablet Take 1 tablet (500 mg total) by mouth 2 (two) times daily. 08/14/17   Sable Feil, PA-C  cyclobenzaprine (FLEXERIL) 5 MG tablet Take 1 tablet (5 mg total) by mouth 3 (three) times daily as needed for muscle spasms. Patient taking differently: Take 5 mg by mouth at bedtime.  02/08/17   Merlyn Lot, MD   fexofenadine-pseudoephedrine (ALLEGRA-D) 60-120 MG 12 hr tablet Take 1 tablet by mouth 2 (two) times daily. 08/14/17   Sable Feil, PA-C  ketorolac (TORADOL) 10 MG tablet Take 1 tablet (10 mg total) by mouth every 6 (six) hours as needed. 06/24/17   Lavonia Drafts, MD  levalbuterol Penne Lash) 1.25 MG/0.5ML nebulizer solution Take 1.25 mg by nebulization every 4 (four) hours as needed for wheezing or shortness of breath. 01/21/17 01/21/18  Lisa Roca, MD  levofloxacin (LEVAQUIN) 500 MG tablet Take 1 tablet (500 mg total) by mouth daily. 01/20/17   Caryn Section Linden Dolin, PA-C  methocarbamol (ROBAXIN) 500 MG tablet Take 1 tablet (500 mg total) by mouth 2 (two) times daily with a meal. 02/17/17   Leighton Parody, PA-C  ondansetron (ZOFRAN-ODT) 4 MG disintegrating tablet Take 1 tablet (4 mg total) by mouth every 8 (eight) hours as needed for nausea or vomiting. 09/25/17   Cuthriell, Charline Bills, PA-C  oxyCODONE-acetaminophen (PERCOCET) 10-325 MG tablet Take 1 tablet by mouth every 6 (six) hours as needed for pain. 02/17/17 02/17/18  Leighton Parody, PA-C  predniSONE (DELTASONE) 10 MG tablet Take 1 tablet (10 mg total) by mouth daily. 09/25/17   Cuthriell, Charline Bills, PA-C  promethazine (PHENERGAN) 25 MG tablet Take 25 mg by mouth every 6 (six) hours as needed for nausea. 04/04/17   [provider]  traZODone (DESYREL) 150 MG tablet Take one or 2 a qhs Patient taking differently: Take 150 mg by mouth at bedtime.  01/13/17   Versie Starks, PA-C    Allergies No known allergies  Family History  Problem Relation Age of Onset  . Cancer Mother   . Cancer Sister   . Colon cancer Sister     Social History Social History   Tobacco Use  . Smoking status: Never Smoker  . Smokeless tobacco: Never Used  Substance Use Topics  . Alcohol use: No  . Drug use: No     Review of Systems  Constitutional: Positive fever/chills.  Positive for body aches. Eyes: No visual changes. No discharge ENT: Positive for  nasal congestion and sore throat. Cardiovascular: no chest pain. Respiratory: Positive cough.  Positive SOB. Gastrointestinal: No abdominal pain.  No nausea, no vomiting.  Positive diarrhea.  No constipation. Genitourinary: Negative for dysuria. No hematuria Musculoskeletal: Negative for musculoskeletal pain. Skin: Negative for rash, abrasions, lacerations, ecchymosis. Neurological: Negative for headaches, focal weakness or numbness. 10-point ROS otherwise negative.  ____________________________________________   PHYSICAL EXAM:  VITAL SIGNS: ED Triage Vitals [09/25/17 1737]  Enc Vitals Group     BP (!) 148/91     Pulse Rate (!) 103     Resp 18     Temp 98.9 F (37.2 C)     Temp Source Oral     SpO2 96 %     Weight 256 lb (116.1 kg)     Height 6\' 2"  (1.88 m)     Head Circumference      Peak Flow      Pain Score 1     Pain Loc  Pain Edu?      Excl. in [place]?      Constitutional: Alert and oriented. Well appearing and in no acute distress. Eyes: Conjunctivae are normal. PERRL. EOMI. Head: Atraumatic. ENT:      Ears: EACs unremarkable.  TMs are mildly bulging bilaterally.      Nose: Mild clear congestion/rhinnorhea.      Mouth/Throat: Mucous membranes are moist.  Pharynx is mildly erythematous but nonedematous.  Uvula is midline.  Tonsils are mildly erythematous and edematous but no exudates. Neck: No stridor.  Neck is supple full range of motion Hematological/Lymphatic/Immunilogical: Diffuse, mobile, nontender anterior cervical lymphadenopathy. Cardiovascular: Normal rate, regular rhythm. Normal S1 and S2.  Good peripheral circulation. Respiratory: Increased respiratory effort without tachypnea or retractions.  Patient is unable to finish complete sentences without coughing or feeling short of breath.  Lungs CTAB. Good air entry to the bases with no decreased or absent breath sounds. Gastrointestinal: Bowel sounds 4 quadrants. Soft and nontender to palpation. No guarding  or rigidity. No palpable masses. No distention. No CVA tenderness. Musculoskeletal: Full range of motion to all extremities. No gross deformities appreciated. Neurologic:  Normal speech and language. No gross focal neurologic deficits are appreciated.  Skin:  Skin is warm, dry and intact. No rash noted. Psychiatric: Mood and affect are normal. Speech and behavior are normal. Patient exhibits appropriate insight and judgement.   ____________________________________________   LABS (all labs ordered are listed, but only abnormal results are displayed)  Labs Reviewed  COMPREHENSIVE METABOLIC PANEL - Abnormal; Notable for the following components:      Result Value   Glucose, Bld 122 (*)    Calcium 8.8 (*)    All other components within normal limits  CBC WITH DIFFERENTIAL/PLATELET - Abnormal; Notable for the following components:   RDW 14.7 (*)    All other components within normal limits  INFLUENZA PANEL BY PCR (TYPE A & B)   ____________________________________________  EKG   ____________________________________________  RADIOLOGY Diamantina Providence Cuthriell, personally viewed and evaluated these images (plain radiographs) as part of my medical decision making, as well as reviewing the written report by the radiologist.  Dg Chest 2 View  Result Date: 09/25/2017 CLINICAL DATA:  Cough for 2 days. EXAM: CHEST  2 VIEW COMPARISON:  Radiographs 01/21/2017. FINDINGS: The heart size and mediastinal contours are normal. The lungs are clear. There is no pleural effusion or pneumothorax. No acute osseous findings are identified. IMPRESSION: Stable chest.  No active cardiopulmonary process. Electronically Signed   By: Richardean Sale M.D.   On: 09/25/2017 18:12    ____________________________________________    PROCEDURES  Procedure(s) performed:    Procedures    Medications  ipratropium-albuterol (DUONEB) 0.5-2.5 (3) MG/3ML nebulizer solution 6 mL (6 mLs Nebulization Given 09/25/17  1827)  sodium chloride 0.9 % bolus 1,000 mL (1,000 mLs Intravenous New Bag/Given 09/25/17 1823)  methylPREDNISolone sodium succinate (SOLU-MEDROL) 125 mg/2 mL injection 125 mg (125 mg Intravenous Given 09/25/17 1823)     ____________________________________________   INITIAL IMPRESSION / ASSESSMENT AND PLAN / ED COURSE  Pertinent labs & imaging results that were available during my care of the patient were reviewed by me and considered in my medical decision making (see chart for details).  Review of the [place] CSRS was performed in accordance of the [place] prior to dispensing any controlled drugs.     Patient's diagnosis is consistent with fluency-like illness and bronchitis.  Patient experienced sudden onset of fevers and chills, body aches, nasal congestion,  coughing, shortness of breath, diarrhea.  Symptoms are consistent with influenza testing returns negative.  Patient also had coughing, shortness of breath, wheezing.  Had been using albuterol at home with no relief.  After 2 treatments of DuoNeb, Solu-Medrol, patient is experiencing improvement in symptoms and lung sounds have drastically improved.  She is to continue using his albuterol at home, and I will prescribe prednisone taper, Zofran, Bromfed cough syrup.  Patient is to follow-up with primary care as needed..  Patient is given ED precautions to return to the ED for any worsening or new symptoms.     ____________________________________________  FINAL CLINICAL IMPRESSION(S) / ED DIAGNOSES  Final diagnoses:  Influenza-like illness  Bronchitis      NEW MEDICATIONS STARTED DURING THIS VISIT:  ED Discharge Orders        Ordered    predniSONE (DELTASONE) 10 MG tablet  Daily    Comments:  Take 6 pills x 2 days, 5 pills x 2 days, 4 pills x 2 days, 3 pills x 2 days, 2 pills x 2 days, and 1 pill x 2 days   09/25/17 1944    brompheniramine-pseudoephedrine-DM 30-2-10 MG/5ML syrup  4 times daily PRN     09/25/17 1944    ondansetron  (ZOFRAN-ODT) 4 MG disintegrating tablet  Every 8 hours PRN     09/25/17 1944          This chart was dictated using voice recognition software/Dragon. Despite best efforts to proofread, errors can occur which can change the meaning. Any change was purely unintentional.    Darletta Moll, PA-C 09/25/17 1948    Schuyler Amor, MD 09/26/17 0030

## 2017-10-02 ENCOUNTER — Emergency Department: Payer: Managed Care, Other (non HMO)

## 2017-10-02 ENCOUNTER — Encounter: Payer: Self-pay | Admitting: *Deleted

## 2017-10-02 ENCOUNTER — Other Ambulatory Visit: Payer: Self-pay

## 2017-10-02 ENCOUNTER — Inpatient Hospital Stay
Admission: EM | Admit: 2017-10-02 | Discharge: 2017-10-03 | DRG: 203 | Disposition: A | Discharge status: Home / Self Care | Payer: Managed Care, Other (non HMO) | Attending: Internal Medicine | Admitting: Internal Medicine

## 2017-10-02 DIAGNOSIS — Z79899 Other long term (current) drug therapy: Secondary | ICD-10-CM

## 2017-10-02 DIAGNOSIS — E782 Mixed hyperlipidemia: Secondary | ICD-10-CM | POA: Diagnosis present

## 2017-10-02 DIAGNOSIS — J181 Lobar pneumonia, unspecified organism: Secondary | ICD-10-CM | POA: Diagnosis not present

## 2017-10-02 DIAGNOSIS — R0603 Acute respiratory distress: Secondary | ICD-10-CM | POA: Diagnosis present

## 2017-10-02 DIAGNOSIS — Z7982 Long term (current) use of aspirin: Secondary | ICD-10-CM

## 2017-10-02 DIAGNOSIS — Z7952 Long term (current) use of systemic steroids: Secondary | ICD-10-CM | POA: Diagnosis not present

## 2017-10-02 DIAGNOSIS — Z96643 Presence of artificial hip joint, bilateral: Secondary | ICD-10-CM | POA: Diagnosis present

## 2017-10-02 DIAGNOSIS — J189 Pneumonia, unspecified organism: Secondary | ICD-10-CM | POA: Diagnosis present

## 2017-10-02 DIAGNOSIS — J209 Acute bronchitis, unspecified: Principal | ICD-10-CM | POA: Diagnosis present

## 2017-10-02 DIAGNOSIS — J40 Bronchitis, not specified as acute or chronic: Secondary | ICD-10-CM

## 2017-10-02 LAB — CBC WITH DIFFERENTIAL/PLATELET
BASOS ABS: 0.1 10*3/uL (ref 0–0.1)
Basophils Relative: 1 %
EOS ABS: 0.1 10*3/uL (ref 0–0.7)
EOS PCT: 1 %
HCT: 44 % (ref 40.0–52.0)
Hemoglobin: 14.8 g/dL (ref 13.0–18.0)
Lymphocytes Relative: 37 %
Lymphs Abs: 3.9 10*3/uL — ABNORMAL HIGH (ref 1.0–3.6)
MCH: 29.2 pg (ref 26.0–34.0)
MCHC: 33.7 g/dL (ref 32.0–36.0)
MCV: 86.4 fL (ref 80.0–100.0)
MONO ABS: 0.6 10*3/uL (ref 0.2–1.0)
Monocytes Relative: 6 %
Neutro Abs: 5.9 10*3/uL (ref 1.4–6.5)
Neutrophils Relative %: 55 %
PLATELETS: 337 10*3/uL (ref 150–440)
RBC: 5.09 MIL/uL (ref 4.40–5.90)
RDW: 14.9 % — AB (ref 11.5–14.5)
WBC: 10.5 10*3/uL (ref 3.8–10.6)

## 2017-10-02 LAB — COMPREHENSIVE METABOLIC PANEL
ALT: 34 U/L (ref 17–63)
ANION GAP: 9 (ref 5–15)
AST: 32 U/L (ref 15–41)
Albumin: 3.9 g/dL (ref 3.5–5.0)
Alkaline Phosphatase: 90 U/L (ref 38–126)
BILIRUBIN TOTAL: 0.5 mg/dL (ref 0.3–1.2)
BUN: 19 mg/dL (ref 6–20)
CALCIUM: 8.8 mg/dL — AB (ref 8.9–10.3)
CO2: 26 mmol/L (ref 22–32)
Chloride: 106 mmol/L (ref 101–111)
Creatinine, Ser: 1.22 mg/dL (ref 0.61–1.24)
GFR calc non Af Amer: >60 mL/min (ref >60)
Glucose, Bld: 133 mg/dL — ABNORMAL HIGH (ref 65–99)
POTASSIUM: 3.4 mmol/L — AB (ref 3.5–5.1)
SODIUM: 141 mmol/L (ref 135–145)
TOTAL PROTEIN: 6.7 g/dL (ref 6.5–8.1)

## 2017-10-02 LAB — BLOOD GAS, VENOUS
Acid-Base Excess: 3 mmol/L — ABNORMAL HIGH (ref 0.0–2.0)
Bicarbonate: 29 mmol/L — ABNORMAL HIGH (ref 20.0–28.0)
O2 Saturation: 80.7 %
PCO2 VEN: 49 mmHg (ref 44.0–60.0)
PO2 VEN: 46 mmHg — AB (ref 32.0–45.0)
Patient temperature: 37
pH, Ven: 7.38 (ref 7.250–7.430)

## 2017-10-02 LAB — BRAIN NATRIURETIC PEPTIDE: B NATRIURETIC PEPTIDE 5: 20 pg/mL (ref 0.0–100.0)

## 2017-10-02 LAB — TROPONIN I: Troponin I: <0.03 ng/mL (ref <0.03)

## 2017-10-02 MED ORDER — IPRATROPIUM-ALBUTEROL 0.5-2.5 (3) MG/3ML IN SOLN
3.0000 mL | RESPIRATORY_TRACT | Status: DC
  Administered 2017-10-03 (×2): 3 mL via RESPIRATORY_TRACT
  Filled 2017-10-02 (×3): qty 3

## 2017-10-02 MED ORDER — MAGNESIUM SULFATE 2 GM/50ML IV SOLN
2.0000 g | Freq: Once | INTRAVENOUS | Status: AC
  Administered 2017-10-02: 2 g via INTRAVENOUS
  Filled 2017-10-02: qty 50

## 2017-10-02 MED ORDER — ASPIRIN EC 325 MG PO TBEC
325.0000 mg | DELAYED_RELEASE_TABLET | Freq: Every day | ORAL | Status: DC
  Administered 2017-10-03: 325 mg via ORAL
  Filled 2017-10-02: qty 1

## 2017-10-02 MED ORDER — METHYLPREDNISOLONE SODIUM SUCC 125 MG IJ SOLR
125.0000 mg | Freq: Once | INTRAMUSCULAR | Status: AC
  Administered 2017-10-02: 125 mg via INTRAMUSCULAR
  Filled 2017-10-02: qty 2

## 2017-10-02 MED ORDER — TRAZODONE HCL 50 MG PO TABS
150.0000 mg | ORAL_TABLET | Freq: Every day | ORAL | Status: DC
  Administered 2017-10-03: 150 mg via ORAL
  Filled 2017-10-02: qty 1

## 2017-10-02 MED ORDER — METHYLPREDNISOLONE SODIUM SUCC 125 MG IJ SOLR
60.0000 mg | Freq: Four times a day (QID) | INTRAMUSCULAR | Status: DC
  Filled 2017-10-02: qty 2

## 2017-10-02 MED ORDER — IPRATROPIUM-ALBUTEROL 0.5-2.5 (3) MG/3ML IN SOLN
6.0000 mL | Freq: Once | RESPIRATORY_TRACT | Status: AC
  Administered 2017-10-02: 6 mL via RESPIRATORY_TRACT
  Filled 2017-10-02: qty 6

## 2017-10-02 MED ORDER — ENOXAPARIN SODIUM 40 MG/0.4ML ~~LOC~~ SOLN
40.0000 mg | SUBCUTANEOUS | Status: DC
  Administered 2017-10-03: 40 mg via SUBCUTANEOUS
  Filled 2017-10-02: qty 0.4

## 2017-10-02 MED ORDER — ACETAMINOPHEN 325 MG PO TABS: 650.0000 mg | ORAL_TABLET | Freq: Four times a day (QID) | ORAL | Status: DC | PRN

## 2017-10-02 MED ORDER — BENZONATATE 100 MG PO CAPS: 200.0000 mg | ORAL_CAPSULE | Freq: Two times a day (BID) | ORAL | Status: DC | PRN

## 2017-10-02 MED ORDER — IPRATROPIUM-ALBUTEROL 0.5-2.5 (3) MG/3ML IN SOLN
6.0000 mL | Freq: Once | RESPIRATORY_TRACT | Status: DC
  Administered 2017-10-02: 6 mL via RESPIRATORY_TRACT
  Filled 2017-10-02: qty 6

## 2017-10-02 MED ORDER — GUAIFENESIN-DM 100-10 MG/5ML PO SYRP: 5.0000 mL | ORAL_SOLUTION | ORAL | Status: DC | PRN

## 2017-10-02 MED ORDER — ONDANSETRON HCL 4 MG/2ML IJ SOLN: 4.0000 mg | Freq: Four times a day (QID) | INTRAMUSCULAR | Status: DC | PRN

## 2017-10-02 MED ORDER — DEXTROSE 5 % IV SOLN
500.0000 mg | INTRAVENOUS | Status: DC
  Administered 2017-10-03: 500 mg via INTRAVENOUS
  Filled 2017-10-02 (×2): qty 500

## 2017-10-02 MED ORDER — CEFTRIAXONE SODIUM 1 G IJ SOLR
1.0000 g | INTRAMUSCULAR | Status: DC
  Administered 2017-10-02: 1 g via INTRAVENOUS
  Filled 2017-10-02 (×2): qty 10

## 2017-10-02 MED ORDER — ACETAMINOPHEN 650 MG RE SUPP: 650.0000 mg | Freq: Four times a day (QID) | RECTAL | Status: DC | PRN

## 2017-10-02 MED ORDER — ONDANSETRON HCL 4 MG PO TABS: 4.0000 mg | ORAL_TABLET | Freq: Four times a day (QID) | ORAL | Status: DC | PRN

## 2017-10-02 MED ORDER — OXYCODONE HCL 5 MG PO TABS: 5.0000 mg | ORAL_TABLET | ORAL | Status: DC | PRN

## 2017-10-02 MED ORDER — IPRATROPIUM-ALBUTEROL 0.5-2.5 (3) MG/3ML IN SOLN: 3.0000 mL | RESPIRATORY_TRACT | Status: DC | PRN

## 2017-10-02 NOTE — ED Triage Notes (Signed)
Pt ambulatory to triage.  Pt has sob for 1 week.  Pt having diff talking.  And wheezing.  Pt reports he took 3 nebs without relief.  Pt on prednisone. Pt alert.

## 2017-10-02 NOTE — Progress Notes (Signed)
ANTIBIOTIC CONSULT NOTE - INITIAL  Pharmacy Consult for Ceftriaxone  Indication: pneumonia  Allergies  Allergen Reactions  . No Known Allergies     Patient Measurements: Height: 6\' 2"  (188 cm) Weight: 256 lb (116.1 kg) IBW/kg (Calculated) : 82.2 Adjusted Body Weight:   Vital Signs: Temp: 99 F (37.2 C) (01/14 1826) Temp Source: Oral (01/14 1826) BP: 152/92 (01/14 1826) Pulse Rate: 100 (01/14 1826) Intake/Output from previous day: No intake/output data recorded. Intake/Output from this shift: Total I/O In: 50 [IV Piggyback:50] Out: -   Labs: Recent Labs    10/02/17 1856  WBC 10.5  HGB 14.8  PLT 337  CREATININE 1.22   Estimated Creatinine Clearance: 100.3 mL/min (by C-G formula based on SCr of 1.22 mg/dL). No results for input(s): VANCOTROUGH, VANCOPEAK, VANCORANDOM, GENTTROUGH, GENTPEAK, GENTRANDOM, TOBRATROUGH, TOBRAPEAK, TOBRARND, AMIKACINPEAK, AMIKACINTROU, AMIKACIN in the last 72 hours.   Microbiology: No results found for this or any previous visit (from the past 720 hour(s)).  Medical History: Past Medical History:  Diagnosis Date  . AVN (avascular necrosis of bone) ([place])    r femural head  . Complication of anesthesia    pt. states that he wakes up during procedures  . Pneumonia     Medications:   (Not in a hospital admission) Assessment: CrCl = 100.3 ml/min   Goal of Therapy:  resolution of infection  Plan:  Expected duration 7 days with resolution of temperature and/or normalization of WBC   Ceftriaxone 1 gm IV Q24H currently ordered.    Keyondra Lagrand D 10/02/2017,10:12 PM

## 2017-10-02 NOTE — ED Provider Notes (Signed)
[facility] Emergency Department Provider Note  ____________________________________________  Time seen: Approximately 6:39 PM  I have reviewed the triage vital signs and the nursing notes.   HISTORY  Chief Complaint Shortness of Breath    HPI [name] is a 47 y.o. male who presents the emergency department for shortness of breath, wheezing, inability to speak in complete sentences.  Patient was seen by myself a week ago with similar symptoms but less severe complaints.  At that time, patient had symptoms for 3-4 days.  At that time, patient had fevers and chills, nasal congestion, body aches, sore throat, coughing, diarrhea.  Patient reports that he still has some mild nasal congestion still coughing, still feeling feverish and chilled.  Patient has been taking his prescribed medications from his previous visit as prescribed.  Patient is still currently on a prednisone taper.  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  Patient has taken 2 inhalations of albuterol via inhaler and 4 doses of DuoNeb within the last hour.  Patient reports that he does not have any chest pain but he states that his chest feels tight and he has difficulty taking in a deep breath.  Patient is unable to provide his history in complete sentences.  Patient is speaking in 2-3 word bursts.  Patient has been on a 12-day prednisone taper, Zofran and Bromfed cough syrup as well as albuterol inhaler.  Patient denies headache, visual changes, neck pain or stiffness, chest pain, abdominal pain, nausea or vomiting.  Past Medical History:  Diagnosis Date  . AVN (avascular necrosis of bone) ([place])    r femural head  . Complication of anesthesia    pt. states that he wakes up during procedures  . Pneumonia     Patient Active Problem List   Diagnosis Date Noted  . AVN of femur ([place]) 02/17/2017  . Fracture of femoral neck, left ([place]) 02/14/2017  .  Hyperlipemia, mixed 10/22/2015  . Adult ADHD 07/17/2015  . Kidney stones 07/17/2015  . 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 COLONOSCOPY N/A 04/01/2015   Procedure: COLONOSCOPY;  Surgeon: [name], MD;  Location: [place];  Service: Gastroenterology;  Laterality: N/A;  . GANGLION CYST EXCISION    . POLYPECTOMY  04/01/2015   Procedure: POLYPECTOMY;  Surgeon: [name], MD;  Location: [place];  Service: Gastroenterology;;  . TONGUE FLAP RELEASE    . TOTAL HIP ARTHROPLASTY  10/07/2011   Procedure: TOTAL HIP ARTHROPLASTY;  Surgeon: Kerin Salen, MD;  Location: [place];  Service: Orthopedics;  Laterality: Right;  . TOTAL HIP ARTHROPLASTY Left 02/17/2017   Procedure: TOTAL HIP ARTHROPLASTY ANTERIOR APPROACH;  Surgeon: Frederik Pear, MD;  Location: [place];  Service: Orthopedics;  Laterality: Left;    Prior to Admission medications   Medication Sig Start Date End Date Taking? Authorizing Provider  albuterol (PROVENTIL HFA;VENTOLIN HFA) 108 (90 Base) MCG/ACT inhaler Inhale 2 puffs into the lungs every 6 (six) hours as needed for wheezing or shortness of breath. 12/15/15   Fisher, Linden Dolin, PA-C  albuterol (PROVENTIL) (2.5 MG/3ML) 0.083% nebulizer solution Take 3 mLs (2.5 mg total) by nebulization every 6 (six) hours as needed for wheezing or shortness of breath. 01/21/17   Loney Hering, MD  aspirin EC 325 MG tablet Take 1 tablet (325 mg total) by mouth 2 (two) times daily. 02/17/17   Hardin Negus,  Kerry Hough, PA-C  benzonatate (TESSALON) 200 MG capsule Take 1 capsule (200 mg total) by mouth 2 (two) times daily as needed for cough. 08/14/17   Sable Feil, PA-C  brompheniramine-pseudoephedrine-DM 30-2-10 MG/5ML syrup Take 10 mLs by mouth 4 (four) times daily as needed. 09/25/17   Raequan Vanschaick, Charline Bills, PA-C  clarithromycin (BIAXIN) 500 MG tablet Take 1 tablet (500 mg total) by mouth 2 (two) times daily. 08/14/17    Sable Feil, PA-C  cyclobenzaprine (FLEXERIL) 5 MG tablet Take 1 tablet (5 mg total) by mouth 3 (three) times daily as needed for muscle spasms. Patient taking differently: Take 5 mg by mouth at bedtime.  02/08/17   Merlyn Lot, MD  fexofenadine-pseudoephedrine (ALLEGRA-D) 60-120 MG 12 hr tablet Take 1 tablet by mouth 2 (two) times daily. 08/14/17   Sable Feil, PA-C  ketorolac (TORADOL) 10 MG tablet Take 1 tablet (10 mg total) by mouth every 6 (six) hours as needed. 06/24/17   Lavonia Drafts, MD  levalbuterol Penne Lash) 1.25 MG/0.5ML nebulizer solution Take 1.25 mg by nebulization every 4 (four) hours as needed for wheezing or shortness of breath. 01/21/17 01/21/18  Lisa Roca, MD  levofloxacin (LEVAQUIN) 500 MG tablet Take 1 tablet (500 mg total) by mouth daily. 01/20/17   Caryn Section Linden Dolin, PA-C  methocarbamol (ROBAXIN) 500 MG tablet Take 1 tablet (500 mg total) by mouth 2 (two) times daily with a meal. 02/17/17   Leighton Parody, PA-C  ondansetron (ZOFRAN-ODT) 4 MG disintegrating tablet Take 1 tablet (4 mg total) by mouth every 8 (eight) hours as needed for nausea or vomiting. 09/25/17   Koy Lamp, Charline Bills, PA-C  oxyCODONE-acetaminophen (PERCOCET) 10-325 MG tablet Take 1 tablet by mouth every 6 (six) hours as needed for pain. 02/17/17 02/17/18  Leighton Parody, PA-C  predniSONE (DELTASONE) 10 MG tablet Take 1 tablet (10 mg total) by mouth daily. 09/25/17   Florencia Zaccaro, Charline Bills, PA-C  promethazine (PHENERGAN) 25 MG tablet Take 25 mg by mouth every 6 (six) hours as needed for nausea. 04/04/17   [provider]  traZODone (DESYREL) 150 MG tablet Take one or 2 a qhs Patient taking differently: Take 150 mg by mouth at bedtime.  01/13/17   Versie Starks, PA-C    Allergies No known allergies  Family History  Problem Relation Age of Onset  . Cancer Mother   . Cancer Sister   . Colon cancer Sister     Social History Social History   Tobacco Use  . Smoking status: Never Smoker  .  Smokeless tobacco: Never Used  Substance Use Topics  . Alcohol use: No  . Drug use: No     Review of Systems  Constitutional: Positive fever/chills Eyes: No visual changes. No discharge ENT: Positive for nasal congestion Cardiovascular: no chest pain. Respiratory: Positive cough.  Positive SOB.  Difficulty speaking in complete sentences. Gastrointestinal: No abdominal pain.  No nausea, no vomiting.  No diarrhea.  No constipation. Musculoskeletal: Negative for musculoskeletal pain. Skin: Negative for rash, abrasions, lacerations, ecchymosis. Neurological: Negative for headaches, focal weakness or numbness. 10-point ROS otherwise negative.  ____________________________________________   PHYSICAL EXAM:  VITAL SIGNS: ED Triage Vitals [10/02/17 1826]  Enc Vitals Group     BP (!) 152/92     Pulse Rate 100     Resp (!) 24     Temp 99 F (37.2 C)     Temp Source Oral     SpO2 93 %     Weight  256 lb (116.1 kg)     Height 6\' 2"  (1.88 m)     Head Circumference      Peak Flow      Pain Score      Pain Loc      Pain Edu?      Excl. in [place]?      Constitutional: Alert and oriented.  Ill-appearing and in mild to moderate distress. Eyes: Conjunctivae are normal. PERRL. EOMI. Head: Atraumatic. ENT:      Ears: EACs and TMs unremarkable bilaterally.      Nose: Minimal to moderate congestion/rhinnorhea.      Mouth/Throat: Mucous membranes are moist.  Pharynx is nonerythematous and nonedematous.  Uvula is midline. Neck: No stridor.   Hematological/Lymphatic/Immunilogical: No cervical lymphadenopathy. Cardiovascular: Normal rate, regular rhythm. Normal S1 and S2.  Good peripheral circulation. Respiratory: Increased respiratory effort with some use of accessory muscles.  Tachypneic.. Lungs significant inspiratory and expiratory wheezing in the upper lung fields, very little air movement in the lower.  No appreciable rales or rhonchi. Musculoskeletal: Full range of motion to all  extremities. No gross deformities appreciated. Neurologic:  Normal speech and language. No gross focal neurologic deficits are appreciated.  Skin:  Skin is warm, dry and intact. No rash noted. Psychiatric: Mood and affect are normal. Speech and behavior are normal. Patient exhibits appropriate insight and judgement.   ____________________________________________   LABS (all labs ordered are listed, but only abnormal results are displayed)  Labs Reviewed  CBC WITH DIFFERENTIAL/PLATELET - Abnormal; Notable for the following components:      Result Value   RDW 14.9 (*)    Lymphs Abs 3.9 (*)    All other components within normal limits  COMPREHENSIVE METABOLIC PANEL - Abnormal; Notable for the following components:   Potassium 3.4 (*)    Glucose, Bld 133 (*)    Calcium 8.8 (*)    All other components within normal limits  BLOOD GAS, VENOUS - Abnormal; Notable for the following components:   pO2, Ven 46.0 (*)    Bicarbonate 29.0 (*)    Acid-Base Excess 3.0 (*)    All other components within normal limits  BRAIN NATRIURETIC PEPTIDE  TROPONIN I   ____________________________________________  EKG  ED ECG REPORT I, Charline Bills Steve Youngberg,  personally viewed and interpreted this ECG.   Date: 10/02/2017  EKG Time: 1929 hrs.  Rate: 98 bpm  Rhythm: normal EKG, normal sinus rhythm, unchanged from previous tracings  Axis: Normal axis  Intervals:none  ST&T Change: No ST elevations or depressions noted  Normal EKG  ____________________________________________  RADIOLOGY Diamantina Providence Chealsea Paske, personally viewed and evaluated these images (plain radiographs) as part of my medical decision making, as well as reviewing the written report by the radiologist.  Dg Chest 2 View  Result Date: 10/02/2017 CLINICAL DATA:  Shortness of breath for 1 week. EXAM: CHEST  2 VIEW COMPARISON:  09/25/2017 FINDINGS: The heart size and mediastinal contours are within normal limits. Both lungs are  clear. The visualized skeletal structures are unremarkable. IMPRESSION: No active cardiopulmonary disease. Electronically Signed   By: Kerby Moors M.D.   On: 10/02/2017 19:21    ____________________________________________    PROCEDURES  Procedure(s) performed:    Procedures    Medications  ipratropium-albuterol (DUONEB) 0.5-2.5 (3) MG/3ML nebulizer solution 6 mL (6 mLs Nebulization Given 10/02/17 1844)  methylPREDNISolone sodium succinate (SOLU-MEDROL) 125 mg/2 mL injection 125 mg (125 mg Intramuscular Given 10/02/17 1900)  magnesium sulfate IVPB 2 g 50 mL (  0 g Intravenous Stopped 10/02/17 2008)  ipratropium-albuterol (DUONEB) 0.5-2.5 (3) MG/3ML nebulizer solution 6 mL (6 mLs Nebulization Given 10/02/17 2112)     ____________________________________________   INITIAL IMPRESSION / ASSESSMENT AND PLAN / ED COURSE  Pertinent labs & imaging results that were available during my care of the patient were reviewed by me and considered in my medical decision making (see chart for details).  Review of the  [place]RS was performed in accordance of the [place] prior to dispensing any controlled drugs.  Clinical Course as of Oct 03 2131  Mon Oct 02, 2017  1843 Patient presented to the emergency department with worsening respiratory symptoms.  Patient was seen by myself a week ago and patient has had increased shortness of breath, increased coughing, increased wheezing.  Patient has used albuterol inhaler x2 and 4 DuoNeb nebulizer treatments in the last hour with significant wheezing and respiratory complaints persisting.  Patient has expiratory and expiratory wheezing in the upper lung fields with decreased breath sounds in bilateral lower lung fields.  Patient has increased work of breathing.  He is unable to talk in complete sentences.  [JC]    Clinical Course User Index [JC] Mayara Paulson, Charline Bills, PA-C    Patient's diagnosis is consistent with bronchitis and respiratory distress.  Patient  initially presented to the emergency department 1 week ago for influenza-like symptoms.  Patient's exam and lab work at that time were reassuring.  Patient was discharged home to take 12-day steroid taper, albuterol and/or DuoNeb at home, Bromfed cough syrup.  Patient reports a worsening of his symptoms in regards to coughing, shortness of breath, wheezing.  Prior to arrival today, patient had taken 2 albuterol treatments and 4 DuoNeb treatments and was still significantly short of breath, audible wheezing, difficulty talking.  On initial exam, patient was pale, increased work of breathing, tachypneic.  Patient's O2 sat initially were in the low 90s, at rest on room air.  Patient received 4 additional DuoNeb treatments here, supplemental oxygen, Solu-Medrol, magnesium before patient had any improvement.  Patient was then able to complete a full sentence at one time.  Patient reports improvement.  However, approximately 20 minutes later, patient is feeling chest tightness, shortness of breath, difficulty completing sentences again. Patient would probably be a good candidate for BiPAP at this time. I discussed the case with hospitalist who agrees that patient is suitable for admission.  Patient care turned over to hospitalist service.     ____________________________________________  FINAL CLINICAL IMPRESSION(S) / ED DIAGNOSES  Final diagnoses:  Bronchitis  Respiratory distress      NEW MEDICATIONS STARTED DURING THIS VISIT:  ED Discharge Orders    None          This chart was dictated using voice recognition software/Dragon. Despite best efforts to proofread, errors can occur which can change the meaning. Any change was purely unintentional.    Darletta Moll, PA-C 10/02/17 2133    Kieu Quiggle, Charline Bills, PA-C 10/02/17 [place], [place], MD 10/04/17 [phone]

## 2017-10-02 NOTE — H&P (Signed)
[place] [facility] [place] NAME: [name]    MR#:  [id number]  DATE OF BIRTH:  07-02-1971  DATE OF ADMISSION:  10/02/2017  PRIMARY CARE PHYSICIAN: Idelle Crouch, MD   REQUESTING/REFERRING PHYSICIAN: Alfred Levins, MD  CHIEF COMPLAINT:   Chief Complaint  Patient presents with  . Shortness of Breath    HISTORY OF PRESENT ILLNESS:  [name]  is a 47 y.o. male who presents with 10 days of progressive cough, shortness of breath, wheezing.  Patient states that he initially was told that he had bronchitis and was given a course of steroids, but that he did not feel any significant improvement.  He returns to the ED today for evaluation with significant respiratory distress.  He required multiple rounds of DuoNeb's, IV mag and IV Solu-Medrol to temporize his respiratory distress in the ED.  Chest x-ray is not read by radiology as overt pneumonia, but looks to have potential interstitial pneumonia on my read.  Patient states she has had a cough productive of greenish sputum and some chills at home with suspected fever.  Hospitalist called for admission  PAST MEDICAL HISTORY:   Past Medical History:  Diagnosis Date  . AVN (avascular necrosis of bone) ([place])    r femural head  . Complication of anesthesia    pt. states that he wakes up during procedures  . Pneumonia     PAST SURGICAL HISTORY:   Past Surgical History:  Procedure Laterality Date  . COLONOSCOPY N/A 04/01/2015   Procedure: COLONOSCOPY;  Surgeon: [name], MD;  Location: [place];  Service: Gastroenterology;  Laterality: N/A;  . GANGLION CYST EXCISION    . POLYPECTOMY  04/01/2015   Procedure: POLYPECTOMY;  Surgeon: [name], MD;  Location: [place];  Service: Gastroenterology;;  . TONGUE FLAP RELEASE    . TOTAL HIP ARTHROPLASTY  10/07/2011   Procedure: TOTAL HIP ARTHROPLASTY;  Surgeon: Kerin Salen, MD;  Location: [place];  Service: Orthopedics;   Laterality: Right;  . TOTAL HIP ARTHROPLASTY Left 02/17/2017   Procedure: TOTAL HIP ARTHROPLASTY ANTERIOR APPROACH;  Surgeon: Frederik Pear, MD;  Location: [place];  Service: Orthopedics;  Laterality: Left;    SOCIAL HISTORY:   Social History   Tobacco Use  . Smoking status: Never Smoker  . Smokeless tobacco: Never Used  Substance Use Topics  . Alcohol use: No    FAMILY HISTORY:   Family History  Problem Relation Age of Onset  . Cancer Mother   . Cancer Sister   . Colon cancer Sister     DRUG ALLERGIES:   Allergies  Allergen Reactions  . No Known Allergies     MEDICATIONS AT HOME:   Prior to Admission medications   Medication Sig Start Date End Date Taking? Authorizing Provider  albuterol (PROVENTIL HFA;VENTOLIN HFA) 108 (90 Base) MCG/ACT inhaler Inhale 2 puffs into the lungs every 6 (six) hours as needed for wheezing or shortness of breath. 12/15/15   Fisher, Linden Dolin, PA-C  albuterol (PROVENTIL) (2.5 MG/3ML) 0.083% nebulizer solution Take 3 mLs (2.5 mg total) by nebulization every 6 (six) hours as needed for wheezing or shortness of breath. 01/21/17   Loney Hering, MD  aspirin EC 325 MG tablet Take 1 tablet (325 mg total) by mouth 2 (two) times daily. 02/17/17   Leighton Parody, PA-C  benzonatate (TESSALON) 200 MG capsule Take 1 capsule (200 mg total) by mouth 2 (two) times daily as needed  for cough. 08/14/17   Sable Feil, PA-C  brompheniramine-pseudoephedrine-DM 30-2-10 MG/5ML syrup Take 10 mLs by mouth 4 (four) times daily as needed. 09/25/17   Cuthriell, Charline Bills, PA-C  clarithromycin (BIAXIN) 500 MG tablet Take 1 tablet (500 mg total) by mouth 2 (two) times daily. 08/14/17   Sable Feil, PA-C  cyclobenzaprine (FLEXERIL) 5 MG tablet Take 1 tablet (5 mg total) by mouth 3 (three) times daily as needed for muscle spasms. Patient taking differently: Take 5 mg by mouth at bedtime.  02/08/17   Merlyn Lot, MD  fexofenadine-pseudoephedrine (ALLEGRA-D) 60-120 MG 12  hr tablet Take 1 tablet by mouth 2 (two) times daily. 08/14/17   Sable Feil, PA-C  ketorolac (TORADOL) 10 MG tablet Take 1 tablet (10 mg total) by mouth every 6 (six) hours as needed. 06/24/17   Lavonia Drafts, MD  levalbuterol Penne Lash) 1.25 MG/0.5ML nebulizer solution Take 1.25 mg by nebulization every 4 (four) hours as needed for wheezing or shortness of breath. 01/21/17 01/21/18  Lisa Roca, MD  levofloxacin (LEVAQUIN) 500 MG tablet Take 1 tablet (500 mg total) by mouth daily. 01/20/17   Caryn Section Linden Dolin, PA-C  methocarbamol (ROBAXIN) 500 MG tablet Take 1 tablet (500 mg total) by mouth 2 (two) times daily with a meal. 02/17/17   Leighton Parody, PA-C  ondansetron (ZOFRAN-ODT) 4 MG disintegrating tablet Take 1 tablet (4 mg total) by mouth every 8 (eight) hours as needed for nausea or vomiting. 09/25/17   Cuthriell, Charline Bills, PA-C  oxyCODONE-acetaminophen (PERCOCET) 10-325 MG tablet Take 1 tablet by mouth every 6 (six) hours as needed for pain. 02/17/17 02/17/18  Leighton Parody, PA-C  predniSONE (DELTASONE) 10 MG tablet Take 1 tablet (10 mg total) by mouth daily. 09/25/17   Cuthriell, Charline Bills, PA-C  promethazine (PHENERGAN) 25 MG tablet Take 25 mg by mouth every 6 (six) hours as needed for nausea. 04/04/17   [provider]  traZODone (DESYREL) 150 MG tablet Take one or 2 a qhs Patient taking differently: Take 150 mg by mouth at bedtime.  01/13/17   Versie Starks, PA-C    REVIEW OF SYSTEMS:  Review of Systems  Constitutional: Negative for chills, fever, malaise/fatigue and weight loss.  HENT: Negative for ear pain, hearing loss and tinnitus.   Eyes: Negative for blurred vision, double vision, pain and redness.  Respiratory: Positive for cough, sputum production, shortness of breath and wheezing. Negative for hemoptysis.   Cardiovascular: Negative for chest pain, palpitations, orthopnea and leg swelling.  Gastrointestinal: Negative for abdominal pain, constipation, diarrhea, nausea and  vomiting.  Genitourinary: Negative for dysuria, frequency and hematuria.  Musculoskeletal: Negative for back pain, joint pain and neck pain.  Skin:       No acne, rash, or lesions  Neurological: Negative for dizziness, tremors, focal weakness and weakness.  Endo/Heme/Allergies: Negative for polydipsia. Does not bruise/bleed easily.  Psychiatric/Behavioral: Negative for depression. The patient is not nervous/anxious and does not have insomnia.      VITAL SIGNS:   Vitals:   10/02/17 1826  BP: (!) 152/92  Pulse: 100  Resp: (!) 24  Temp: 99 F (37.2 C)  TempSrc: Oral  SpO2: 93%  Weight: 116.1 kg (256 lb)  Height: 6\' 2"  (1.88 m)   Wt Readings from Last 3 Encounters:  10/02/17 116.1 kg (256 lb)  09/25/17 116.1 kg (256 lb)  06/24/17 115.2 kg (254 lb)    PHYSICAL EXAMINATION:  Physical Exam  Vitals reviewed. Constitutional: He is oriented  to person, place, and time. He appears well-developed and well-nourished. No distress.  HENT:  Head: Normocephalic and atraumatic.  Mouth/Throat: Oropharynx is clear and moist.  Eyes: Conjunctivae and EOM are normal. Pupils are equal, round, and reactive to light. No scleral icterus.  Neck: Normal range of motion. Neck supple. No JVD present. No thyromegaly present.  Cardiovascular: Normal rate, regular rhythm and intact distal pulses. Exam reveals no gallop and no friction rub.  No murmur heard. Respiratory: Effort normal. No respiratory distress. He has wheezes. He has rales.  GI: Soft. Bowel sounds are normal. He exhibits no distension. There is no tenderness.  Musculoskeletal: Normal range of motion. He exhibits no edema.  No arthritis, no gout  Lymphadenopathy:    He has no cervical adenopathy.  Neurological: He is alert and oriented to person, place, and time. No cranial nerve deficit.  No dysarthria, no aphasia  Skin: Skin is warm and dry. No rash noted. No erythema.  Psychiatric: He has a normal mood and affect. His behavior is  normal. Judgment and thought content normal.    LABORATORY PANEL:   CBC Recent Labs  Lab 10/02/17 1856  WBC 10.5  HGB 14.8  HCT 44.0  PLT 337   ------------------------------------------------------------------------------------------------------------------  Chemistries  Recent Labs  Lab 10/02/17 1856  NA 141  K 3.4*  CL 106  CO2 26  GLUCOSE 133*  BUN 19  CREATININE 1.22  CALCIUM 8.8*  AST 32  ALT 34  ALKPHOS 90  BILITOT 0.5   ------------------------------------------------------------------------------------------------------------------  Cardiac Enzymes Recent Labs  Lab 10/02/17 1856  TROPONINI <0.03   ------------------------------------------------------------------------------------------------------------------  RADIOLOGY:  Dg Chest 2 View  Result Date: 10/02/2017 CLINICAL DATA:  Shortness of breath for 1 week. EXAM: CHEST  2 VIEW COMPARISON:  09/25/2017 FINDINGS: The heart size and mediastinal contours are within normal limits. Both lungs are clear. The visualized skeletal structures are unremarkable. IMPRESSION: No active cardiopulmonary disease. Electronically Signed   By: Kerby Moors M.D.   On: 10/02/2017 19:21    EKG:   Orders placed or performed during the hospital encounter of 10/02/17  . ED EKG  . ED EKG    IMPRESSION AND PLAN:  Principal Problem:   CAP (community acquired pneumonia) -IV antibiotics ordered, scheduled and PRN duo nebs, PRN antitussive Active Problems:   Hyperlipemia, mixed -not on medications at home for this, heart healthy diet here.  All the records are reviewed and case discussed with ED provider. Management plans discussed with the patient and/or family.  DVT PROPHYLAXIS: SubQ lovenox  GI PROPHYLAXIS: None  ADMISSION STATUS: Inpatient  CODE STATUS: Full Code Status History    Date Active Date Inactive Code Status Order ID Comments User Context   02/17/2017 15:27 02/18/2017 14:03 Full Code [id number]   N[facility] [place] Inpatient   10/07/2011 15:23 10/08/2011 14:40 Full Code [id number]  Day, Colletta [place], RN Inpatient      TOTAL TIME TAKING CARE OF THIS PATIENT: 45 minutes.   Jhostin Epps FIELDING 10/02/2017, 10:02 PM  [employer]  [phone]  CC: Primary care physician; Idelle Crouch, MD  Note:  This document was prepared using Dragon voice recognition software and may include unintentional dictation errors.

## 2017-10-02 NOTE — ED Notes (Signed)
Patient transported to X-ray 

## 2017-10-03 LAB — BASIC METABOLIC PANEL
Anion gap: 12 (ref 5–15)
BUN: 18 mg/dL (ref 6–20)
CHLORIDE: 104 mmol/L (ref 101–111)
CO2: 25 mmol/L (ref 22–32)
CREATININE: 1.13 mg/dL (ref 0.61–1.24)
Calcium: 8.8 mg/dL — ABNORMAL LOW (ref 8.9–10.3)
Glucose, Bld: 196 mg/dL — ABNORMAL HIGH (ref 65–99)
Potassium: 4 mmol/L (ref 3.5–5.1)
Sodium: 141 mmol/L (ref 135–145)

## 2017-10-03 LAB — CBC
HCT: 42.6 % (ref 40.0–52.0)
Hemoglobin: 14.3 g/dL (ref 13.0–18.0)
MCH: 29.2 pg (ref 26.0–34.0)
MCHC: 33.7 g/dL (ref 32.0–36.0)
MCV: 86.9 fL (ref 80.0–100.0)
PLATELETS: 339 10*3/uL (ref 150–440)
RBC: 4.9 MIL/uL (ref 4.40–5.90)
RDW: 14.9 % — AB (ref 11.5–14.5)
WBC: 9.8 10*3/uL (ref 3.8–10.6)

## 2017-10-03 MED ORDER — LEVOFLOXACIN 500 MG PO TABS: 500.0000 mg | ORAL_TABLET | Freq: Every day | ORAL | Status: DC

## 2017-10-03 MED ORDER — LEVOFLOXACIN 500 MG PO TABS: 500.0000 mg | ORAL_TABLET | Freq: Every day | ORAL | 0 refills | Status: DC

## 2017-10-03 MED ORDER — METHYLPREDNISOLONE SODIUM SUCC 125 MG IJ SOLR
60.0000 mg | Freq: Four times a day (QID) | INTRAMUSCULAR | Status: DC
  Administered 2017-10-03 (×2): 60 mg via INTRAVENOUS
  Filled 2017-10-03: qty 2

## 2017-10-03 MED ORDER — PREDNISONE 10 MG (21) PO TBPK: ORAL_TABLET | ORAL | 0 refills | Status: DC

## 2017-10-03 NOTE — Progress Notes (Signed)
Pt ready for d/c home today per MD. Pt in NAD this am, O2 saturations mid 90's on RA, pt denies cough or SOB. VSS. Telemetry d/c, PIV removed. Reviewed discharge instructions and prescriptions with pt, all questions answered. Pt refuses volunteer assistance to car.     [place], Jerry Caras

## 2017-10-03 NOTE — Progress Notes (Signed)
ANTIBIOTIC CONSULT NOTE - INITIAL  Pharmacy Consult for Levofloxacin   Indication: COPD  Allergies  Allergen Reactions  . No Known Allergies     Patient Measurements: Height: 6\' 2"  (188 cm) Weight: 254 lb (115.2 kg) IBW/kg (Calculated) : 82.2 Adjusted Body Weight:   Vital Signs: Temp: 97.5 F (36.4 C) (01/15 0749) Temp Source: Oral (01/15 0749) BP: 149/76 (01/15 0749) Pulse Rate: 101 (01/15 0749) Intake/Output from previous day: 01/14 0701 - 01/15 0700 In: 100 [IV Piggyback:100] Out: -  Intake/Output from this shift: No intake/output data recorded.  Labs: Recent Labs    10/02/17 1856 10/03/17 0319  WBC 10.5 9.8  HGB 14.8 14.3  PLT 337 339  CREATININE 1.22 1.13   Estimated Creatinine Clearance: 107.9 mL/min (by C-G formula based on SCr of 1.13 mg/dL). No results for input(s): VANCOTROUGH, VANCOPEAK, VANCORANDOM, GENTTROUGH, GENTPEAK, GENTRANDOM, TOBRATROUGH, TOBRAPEAK, TOBRARND, AMIKACINPEAK, AMIKACINTROU, AMIKACIN in the last 72 hours.   Microbiology: No results found for this or any previous visit (from the past 720 hour(s)).  Medical History: Past Medical History:  Diagnosis Date  . 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 Complication of anesthesia    pt. states that he wakes up during procedures  . Pneumonia     Medications:  Medications Prior to Admission  Medication Sig Dispense Refill Last Dose  . cyclobenzaprine (FLEXERIL) 5 MG tablet Take 1 tablet (5 mg total) by mouth 3 (three) times daily as needed for muscle spasms. 30 tablet 0 prn at prn  . meloxicam (MOBIC) 7.5 MG tablet Take 7.5 mg by mouth daily as needed.   prn at prn  . naproxen (NAPROSYN) 500 MG tablet Take 500 mg by mouth 2 (two) times daily as needed for mild pain.   prn at prn  . predniSONE (STERAPRED UNI-PAK 48 TAB) 10 MG (48) TBPK tablet Take 10-40 mg by mouth daily. 40mg  x3 days 30mg  x3 days 20mg  x3 days 10mg  x3 days  0 10/02/2017 at Unknown time  . traZODone  (DESYREL) 150 MG tablet Take one or 2 a qhs (Patient taking differently: Take 300 mg by mouth at bedtime. ) 60 tablet 6 10/01/2017 at Unknown time   Assessment: Patient receiving ceftriaxone and azithromycin for suspected PNA. Now pharmacy consulted for levofloxacin for COPD.    Plan:  Will start levofloxacin 500mg  PO every 24 hours.  Recommend PCT level to help determine need for Abx.  Pernell Dupre, PharmD, BCPS Clinical Pharmacist 10/03/2017 9:02 AM

## 2017-10-03 NOTE — Discharge Instructions (Signed)
Resume diet and activity as before ° ° °

## 2017-10-04 LAB — HIV ANTIBODY (ROUTINE TESTING W REFLEX): HIV SCREEN 4TH GENERATION: NONREACTIVE

## 2017-10-04 NOTE — Discharge Summary (Signed)
[facility] [place] NAME: [name]    MR#:  [id number]  DATE OF BIRTH:  02/15/1971  DATE OF ADMISSION:  10/02/2017 ADMITTING PHYSICIAN: Lance Coon, MD  DATE OF DISCHARGE: 10/03/2017 11:23 AM  PRIMARY CARE PHYSICIAN: Idelle Crouch, MD   ADMISSION DIAGNOSIS:  Respiratory distress [R06.03] Bronchitis [J40]  DISCHARGE DIAGNOSIS:  Principal Problem:   CAP (community acquired pneumonia) Active Problems:   Hyperlipemia, mixed   SECONDARY DIAGNOSIS:   Past Medical History:  Diagnosis Date  . 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 Complication of anesthesia    pt. states that he wakes up during procedures  . Pneumonia      ADMITTING HISTORY  HISTORY OF PRESENT ILLNESS:  [name]  is a 47 y.o. male who presents with 10 days of progressive cough, shortness of breath, wheezing.  Patient states that he initially was told that he had bronchitis and was given a course of steroids, but that he did not feel any significant improvement.  He returns to the ED today for evaluation with significant respiratory distress.  He required multiple rounds of DuoNeb's, IV mag and IV Solu-Medrol to temporize his respiratory distress in the ED.  Chest x-ray is not read by radiology as overt pneumonia, but looks to have potential interstitial pneumonia on my read.  Patient states she has had a cough productive of greenish sputum and some chills at home with suspected fever.  Hospitalist called for admission  HOSPITAL COURSE:   *Acute bronchitis.  Patient was admitted to medical floor with IV steroids, antibiotics and nebulizers.  He improved surprisingly well in less than 2 days with no wheezing and felt close to normal.  Is being discharged home on prednisone, Levaquin and albuterol inhaler.  There was initial concern if patient had pneumonia but chest x-ray was clear patient was afebrile.  Normal WBC.  Not pneumonia.  Patient is  being discharged home in stable condition to follow-up with his primary care physician.  CONSULTS OBTAINED:    DRUG ALLERGIES:   Allergies  Allergen Reactions  . No Known Allergies     DISCHARGE MEDICATIONS:   Allergies as of 10/03/2017      Reactions   No Known Allergies       Medication List    TAKE these medications   cyclobenzaprine 5 MG tablet Commonly known as:  FLEXERIL Take 1 tablet (5 mg total) by mouth 3 (three) times daily as needed for muscle spasms.   levofloxacin 500 MG tablet Commonly known as:  LEVAQUIN Take 1 tablet (500 mg total) by mouth daily.   meloxicam 7.5 MG tablet Commonly known as:  MOBIC Take 7.5 mg by mouth daily as needed.   naproxen 500 MG tablet Commonly known as:  NAPROSYN Take 500 mg by mouth 2 (two) times daily as needed for mild pain.   predniSONE 10 MG (21) Tbpk tablet Commonly known as:  STERAPRED UNI-PAK 21 TAB 60mg  day 1 and taper by 10mg  daily What changed:    medication strength  how much to take  how to take this  when to take this  additional instructions   traZODone 150 MG tablet Commonly known as:  DESYREL Take one or 2 a qhs What changed:    how much to take  how to take this  when to take this  additional instructions       Today   VITAL  SIGNS:  Blood pressure (!) 147/87, pulse 96, temperature 98 F (36.7 C), temperature source Oral, resp. rate 17, height 6\' 2"  (1.88 m), weight 115.2 kg (254 lb), SpO2 96 %.  I/O:  No intake or output data in the 24 hours ending 10/04/17 1511  PHYSICAL EXAMINATION:  Physical Exam  GENERAL:  47 y.o.-year-old patient lying in the bed with no acute distress.  LUNGS: Normal breath sounds bilaterally, no wheezing, rales,rhonchi or crepitation. No use of accessory muscles of respiration.  CARDIOVASCULAR: S1, S2 normal. No murmurs, rubs, or gallops.  ABDOMEN: Soft, non-tender, non-distended. Bowel sounds present. No organomegaly or mass.  NEUROLOGIC: Moves all 4  extremities. PSYCHIATRIC: The patient is alert and oriented x 3.  SKIN: No obvious rash, lesion, or ulcer.   DATA REVIEW:   CBC Recent Labs  Lab 10/03/17 0319  WBC 9.8  HGB 14.3  HCT 42.6  PLT 339    Chemistries  Recent Labs  Lab 10/02/17 1856 10/03/17 0319  NA 141 141  K 3.4* 4.0  CL 106 104  CO2 26 25  GLUCOSE 133* 196*  BUN 19 18  CREATININE 1.22 1.13  CALCIUM 8.8* 8.8*  AST 32  --   ALT 34  --   ALKPHOS 90  --   BILITOT 0.5  --     Cardiac Enzymes Recent Labs  Lab 10/02/17 1856  TROPONINI <0.03    Microbiology Results  Results for orders placed or performed during the hospital encounter of 02/16/17  Surgical pcr screen     Status: Abnormal   Collection Time: 02/16/17  1:04 PM  Result Value Ref Range Status   MRSA, PCR POSITIVE (A) NEGATIVE Final    Comment: RESULT CALLED TO, READ BACK BY AND VERIFIED WITH: Emelda Fear RN 16:10 02/16/17 ([email or web address])    Staphylococcus aureus POSITIVE (A) NEGATIVE Final    Comment:        The Xpert SA Assay (FDA approved for NASAL specimens in patients over 88 years of age), is one component of a comprehensive surveillance program.  Test performance has been validated by [facility] for patients greater than or equal to 32 year old. It is not intended to diagnose infection nor to guide or monitor treatment.     RADIOLOGY:  Dg Chest 2 View  Result Date: 10/02/2017 CLINICAL DATA:  Shortness of breath for 1 week. EXAM: CHEST  2 VIEW COMPARISON:  09/25/2017 FINDINGS: The heart size and mediastinal contours are within normal limits. Both lungs are clear. The visualized skeletal structures are unremarkable. IMPRESSION: No active cardiopulmonary disease. Electronically Signed   By: Kerby Moors M.D.   On: 10/02/2017 19:21    Follow up with PCP in 1 week.  Management plans discussed with the patient, family and they are in agreement.  CODE STATUS:  Code Status History    Date Active Date Inactive Code Status Order  ID Comments User Context   10/02/2017 22:56 10/03/2017 14:29 Full Code [id number]  Lance Coon, MD Inpatient   02/17/2017 15:27 02/18/2017 14:03 Full Code [id number]  Neldon Newport Inpatient   10/07/2011 15:23 10/08/2011 14:40 Full Code [id number]  Day, Colletta [place], RN Inpatient      TOTAL TIME TAKING CARE OF THIS PATIENT ON DAY OF DISCHARGE: more than 30 minutes.   Neita Carp M.D on 10/04/2017 at 3:11 PM  Between 7am to 6pm - Pager - [phone]  After 6pm go to [email or web address] - password [email or web address] [facility]  Office  [phone]  CC: Primary care physician; Idelle Crouch, MD  Note: This dictation was prepared with Dragon dictation along with smaller phrase technology. Any transcriptional errors that result from this process are unintentional.

## 2017-12-18 ENCOUNTER — Ambulatory Visit: Payer: Self-pay | Admitting: Family Medicine

## 2017-12-18 VITALS — BP 134/84 | HR 86 | Temp 98.4°F | Resp 18 | Ht 74.0 in | Wt 271.0 lb

## 2017-12-18 DIAGNOSIS — Z0189 Encounter for other specified special examinations: Principal | ICD-10-CM

## 2017-12-18 DIAGNOSIS — Z008 Encounter for other general examination: Secondary | ICD-10-CM

## 2017-12-18 LAB — GLUCOSE, POCT (MANUAL RESULT ENTRY): POC GLUCOSE: 105 mg/dL — AB (ref 70–99)

## 2017-12-18 NOTE — Progress Notes (Signed)
[name]

## 2017-12-18 NOTE — Progress Notes (Signed)
Subjective: Annual biometrics screening  Patient presents for his annual biometric screening. Patient reports a history of hyperlipidemia and 2 previous hip surgeries. PCP: Dr. Doy Hutching. Patient works for [employer]. Patient denies any other issues or concerns.   Review of Systems Constitutional: Unremarkable.  HEENT: Unremarkable. Gastrointestinal: Unremarkable. Respiratory: Unremarkable.   Cardiovascular: Unremarkable.  ROS otherwise negative.   Objective  Physical Exam General: Awake, alert and oriented. No acute distress. Well developed, hydrated and nourished. Appears stated age.  HEENT:  Supple neck without adenopathy. Sclera is non-icteric. The ear canal is clear without discharge. The tympanic membrane is normal in appearance with normal landmarks and cone of light. Nasal mucosa is pink and moist. Oral mucosa is pink and moist. The pharynx is normal in appearance without tonsillar swelling or exudates.  Skin: Skin in warm, dry and intact without rashes or lesions. Appropriate color for ethnicity. Cardiac: Heart rate and rhythm are normal. No murmurs, gallops, or rubs are auscultated.  Respiratory: The chest wall is symmetric and without deformity. No signs of respiratory distress. Lung sounds are clear in all lobes bilaterally without rales, ronchi, or wheezes.  Abdominal: Abdomen is soft, symmetric, and non-tender without distention. No masses, hepatomegaly, or splenomegaly are noted.  Neurological: The patient is awake, alert and oriented to person, place, and time with normal speech.  Memory is normal and thought processes intact. No gait abnormalities are appreciated.  Psychiatric: Appropriate mood and affect.   Assessment Annual biometrics screen  Plan  Labs pending. Encouraged routine visits with primary care provider.  Fasting blood sugar 105 today, discussed this with patient.  Informed patient to follow-up with his primary care provider regarding this.

## 2017-12-19 LAB — LIPID PANEL
CHOLESTEROL TOTAL: 201 mg/dL — AB (ref 100–199)
Chol/HDL Ratio: 5.7 ratio — ABNORMAL HIGH (ref 0.0–5.0)
HDL: 35 mg/dL — ABNORMAL LOW (ref >39)
LDL Calculated: 128 mg/dL — ABNORMAL HIGH (ref 0–99)
TRIGLYCERIDES: 192 mg/dL — AB (ref 0–149)
VLDL Cholesterol Cal: 38 mg/dL (ref 5–40)

## 2017-12-20 NOTE — Progress Notes (Signed)
[name], Will you call the patient and inform them that their lipid panel came back?  The total cholesterol is 201, normal values are between 100 and 199.  The triglyceride level is 192, normal values are between 0 and 149.  The VLDL cholesterol is 38, normal values are between 5 and 40. The HDL cholesterol ("good cholesterol") is 35, normal values are above 39.  The LDL cholesterol ("bad cholesterol") is 128, normal values are below 99.  The cholesterol/HDL ratio is 5.7, normal values are between 0 and 4.4 for women or 0 and 5 from men.  These abnormal values increase their risk for cardiovascular disease.  Please advise the patient to discuss this with their primary care provider at their next regularly scheduled visit.

## 2018-07-04 IMAGING — CR DG HIP (WITH OR WITHOUT PELVIS) 2-3V*L*
3 series · 3 of 3 positions shown · non-contrast
Comparison: 10/07/2011

CLINICAL DATA: Fall with pain to the left hip

EXAM:
DG HIP (WITH OR WITHOUT PELVIS) 2-3V LEFT

[pelvis ap]
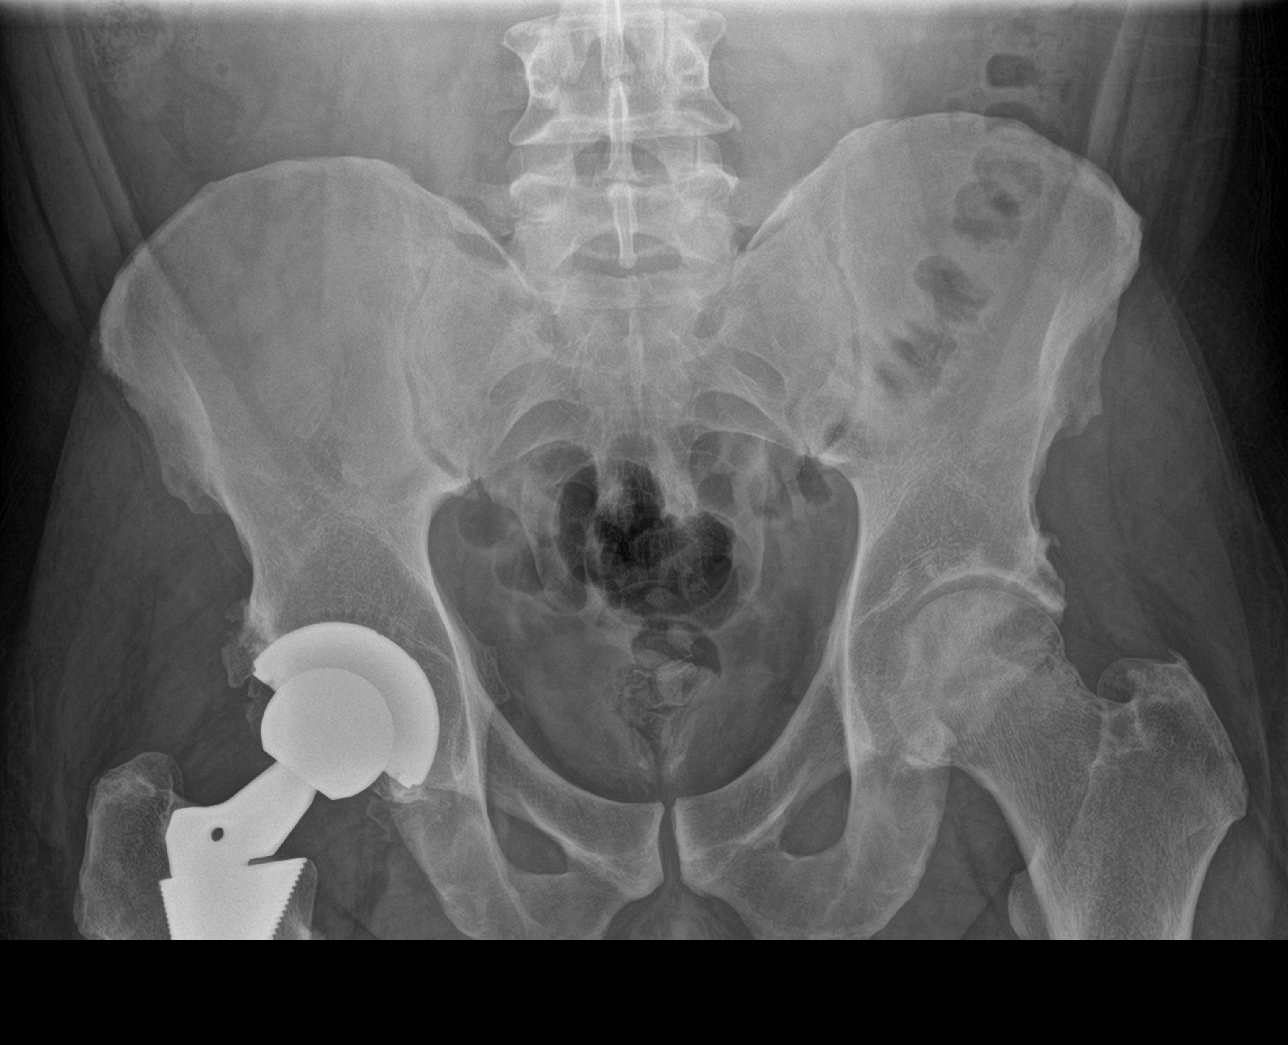

[hip ap]
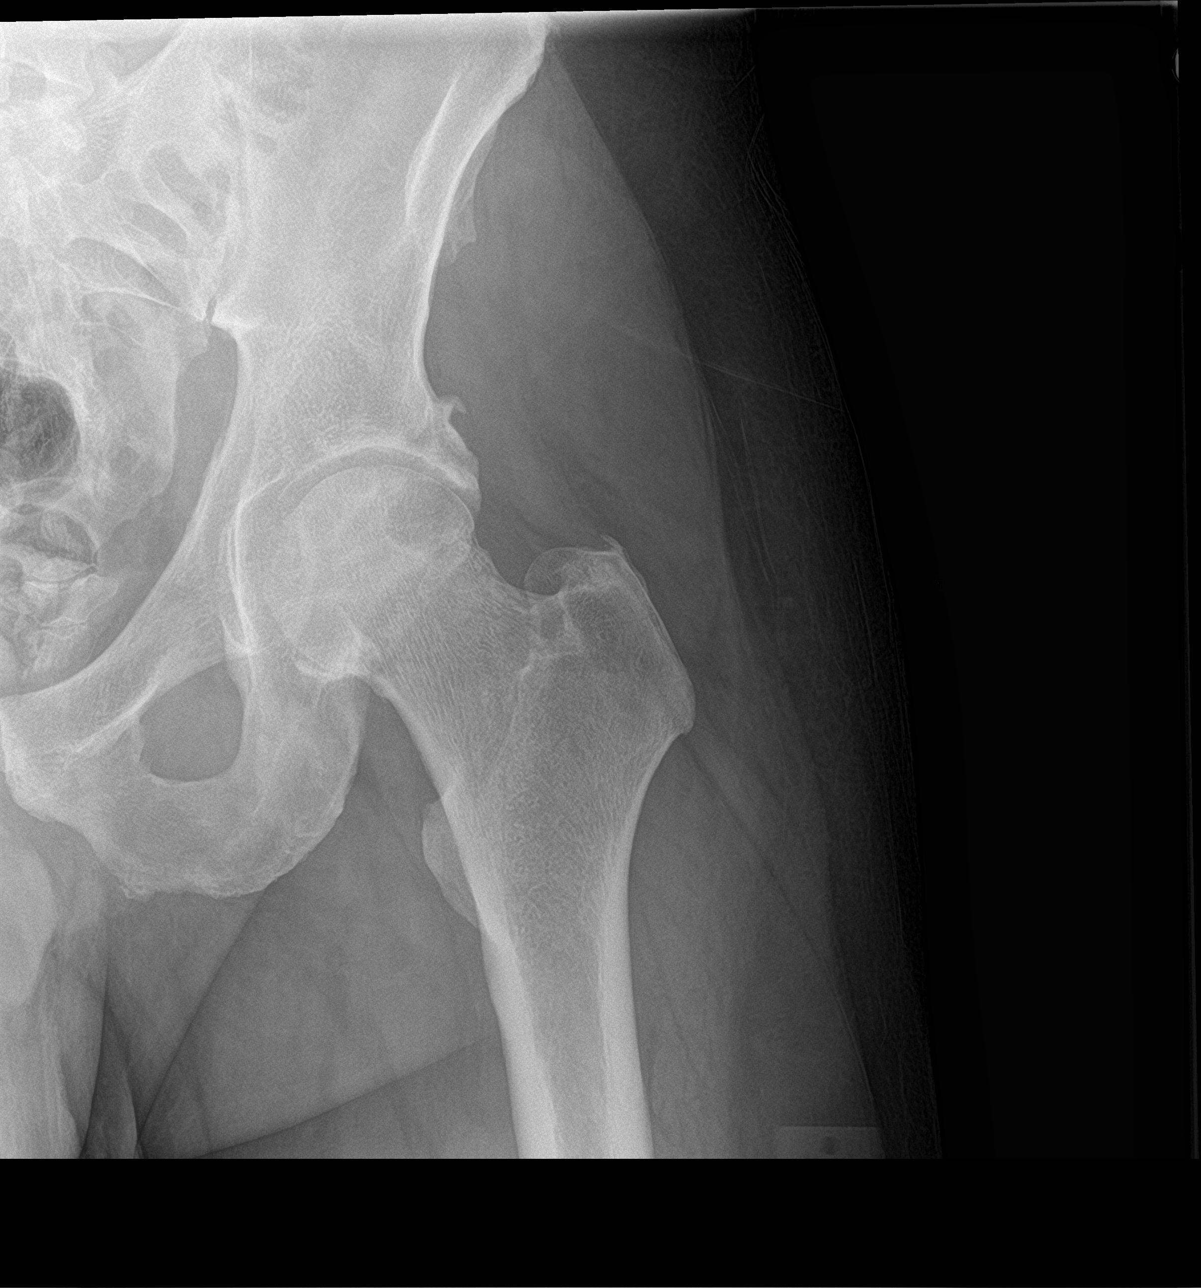

[hip lat]
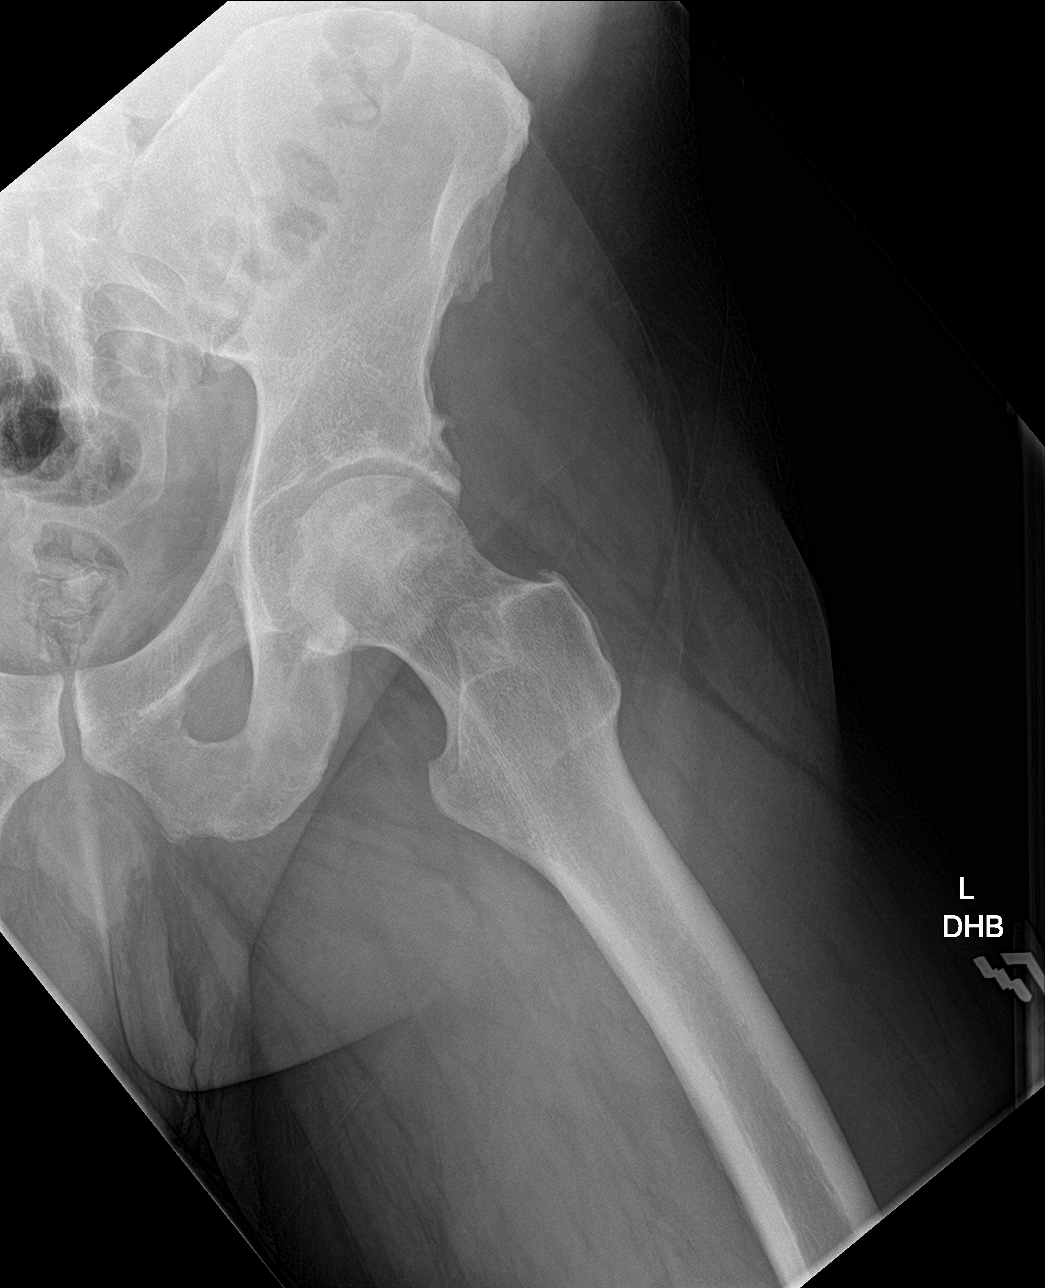

[3 of 3 positions shown; findings below may reference images not displayed]

FINDINGS: Status post right hip replacement with no dislocation. The pubic
symphysis is intact. No fracture or malalignment. Mild arthritis of
the left hip.
IMPRESSION: 1. No definite acute osseous abnormality of the left hip
2. Status post right hip replacement without acute dislocation.

## 2018-10-08 ENCOUNTER — Encounter: Payer: Self-pay | Admitting: *Deleted

## 2018-10-11 ENCOUNTER — Ambulatory Visit: Payer: Self-pay | Admitting: Adult Health

## 2018-10-11 VITALS — BP 140/82 | HR 82 | Temp 98.1°F | Resp 14

## 2018-10-11 DIAGNOSIS — R05 Cough: Secondary | ICD-10-CM | POA: Insufficient documentation

## 2018-10-11 DIAGNOSIS — J0100 Acute maxillary sinusitis, unspecified: Secondary | ICD-10-CM | POA: Insufficient documentation

## 2018-10-11 DIAGNOSIS — J40 Bronchitis, not specified as acute or chronic: Secondary | ICD-10-CM | POA: Insufficient documentation

## 2018-10-11 DIAGNOSIS — R059 Cough, unspecified: Secondary | ICD-10-CM | POA: Insufficient documentation

## 2018-10-11 MED ORDER — AMOXICILLIN-POT CLAVULANATE 875-125 MG PO TABS: 1.0000 | ORAL_TABLET | Freq: Two times a day (BID) | ORAL | 0 refills | Status: DC

## 2018-10-11 MED ORDER — PREDNISONE 10 MG (21) PO TBPK: ORAL_TABLET | ORAL | 0 refills | Status: DC

## 2018-10-11 NOTE — Progress Notes (Addendum)
[facility]  Subjective:     Patient ID: [name], male   DOB: 19-Jun-1971, 48 y.o.   MRN: [id number]  HPI  Blood pressure 140/82, pulse 82, temperature 98.1 F (36.7 C), temperature source Oral, resp. rate 14, SpO2 96 %.  Patient is a 48 year old male in no acute distress who comes to the clinic with complaints of  Sinus congestion, sinus pressure, post nasal drip for over 14 days. He reports green/ yellow discharge.   Mild bronchial congestion and cough.   Reports he sees [name], Kenneth Douglas, MD and pulmonary at recommended times.   He works [employer].   01/31/17 and 10/02/17 has been admitted for bronchitis to hospital. 10/02/17 chest x ray was read as normal. - see imaging tab in Epic.    Denies any wheezing.  Patient  denies any fever, body aches,chills, rash, chest pain, shortness of breath, nausea, vomiting, or diarrhea.    Allergies  Allergen Reactions  . No Known Allergies     Patient Active Problem List   Diagnosis Date Noted  . CAP (community acquired pneumonia) 10/02/2017  . AVN of femur ([place]) 02/17/2017  . Fracture of femoral neck, left ([place]) 02/14/2017  . Hyperlipemia, mixed 10/22/2015  . Adult ADHD 07/17/2015  . Kidney stones 07/17/2015  . 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 Negative for activity change, appetite change, chills, diaphoresis, fever and unexpected weight change.  HENT: Positive for congestion, postnasal drip, rhinorrhea, sinus pressure and sore throat. Negative for dental problem, drooling, ear discharge, ear pain, facial swelling, hearing loss, mouth sores, nosebleeds, sinus pain, sneezing, tinnitus, trouble swallowing and voice change.   Respiratory: Positive for cough and chest tightness (mild ). Negative for apnea, choking, shortness of breath, wheezing and stridor.   Cardiovascular: Negative for  chest pain, palpitations and leg swelling.  Gastrointestinal: Negative.   Genitourinary: Negative.   Musculoskeletal: Negative.   Skin: Negative.   Neurological: Negative.   Hematological: Negative.   Psychiatric/Behavioral: Negative.        Objective:   Physical Exam Vitals signs reviewed.  Constitutional:      General: He is not in acute distress.    Appearance: Normal appearance. He is well-groomed. He is not ill-appearing, toxic-appearing or diaphoretic.     Comments: Patient is alert and oriented and responsive to questions Engages in eye contact with provider. Speaks in full sentences without any pauses without any shortness of breath or distress.    HENT:     Head: Normocephalic and atraumatic.     Salivary Glands: Right salivary gland is not diffusely enlarged or tender. Left salivary gland is not diffusely enlarged or tender.     Right Ear: Hearing, ear canal and external ear normal. There is no impacted cerumen.     Left Ear: Hearing, ear canal and external ear normal.     Nose: Mucosal edema and rhinorrhea present.     Right Sinus: Maxillary sinus tenderness present. No frontal sinus tenderness.     Left Sinus: Maxillary sinus tenderness present. No frontal sinus tenderness.     Mouth/Throat:     Lips: Pink.     Mouth: Mucous membranes are moist.     Pharynx: Uvula midline. Posterior oropharyngeal erythema present. No pharyngeal swelling, oropharyngeal exudate or uvula swelling.     Tonsils: Swelling: 0 on the right. 0 on  the left.     Comments: Oral pharynx patent / voice normal  Eyes:     General: No scleral icterus.       Right eye: No discharge.        Left eye: No discharge.     Conjunctiva/sclera: Conjunctivae normal.  Neck:     Musculoskeletal: Full passive range of motion without pain, normal range of motion and neck supple.     Trachea: Trachea and phonation normal.  Cardiovascular:     Rate and Rhythm: Normal rate and regular rhythm.     Pulses: Normal  pulses.     Heart sounds: Normal heart sounds. No murmur. No friction rub. No gallop.   Pulmonary:     Effort: Pulmonary effort is normal. No respiratory distress.     Breath sounds: Normal breath sounds. No stridor. No wheezing, rhonchi or rales.     Comments: Mild bronchial congestion Mild cough occasional in room.  Chest:     Chest wall: No tenderness.  Musculoskeletal: Normal range of motion.  Lymphadenopathy:     Cervical: Cervical adenopathy present.     Right cervical: Superficial cervical adenopathy (mild bilaterally ) present. No deep or posterior cervical adenopathy.    Left cervical: Superficial cervical adenopathy present. No deep or posterior cervical adenopathy.  Skin:    General: Skin is warm and dry.     Capillary Refill: Capillary refill takes less than 2 seconds.  Neurological:     Mental Status: He is alert and oriented to person, place, and time.     Comments:   Patient moves on and off of exam table and in room without difficulty. Gait is normal in hall and in room. Patient is oriented to person place time and situation. Patient answers questions appropriately and engages in conversation.   Psychiatric:        Mood and Affect: Mood normal.        Behavior: Behavior normal. Behavior is cooperative.        Thought Content: Thought content normal.        Judgment: Judgment normal.        Assessment:     Acute non-recurrent maxillary sinusitis  Cough- post nasal drip   Bronchitis- mild      Plan:   Advised regular follow ups with primary care given chronic recurrent conditions and hospitalizations in past.  Meds ordered this encounter  Medications  . amoxicillin-clavulanate (AUGMENTIN) 875-125 MG tablet    Sig: Take 1 tablet by mouth 2 (two) times daily.    Dispense:  20 tablet    Refill:  0  . predniSONE (STERAPRED UNI-PAK 21 TAB) 10 MG (21) TBPK tablet    Sig: PO: Take 6 tablets on day 1:Take 5 tablets day 2:Take 4 tablets day 3: Take 3 tablets day  4:Take 2 tablets day five: 5 Take 1 tablet day 6    Dispense:  21 tablet    Refill:  0   Was advised to discontinue Mobic while on Prednisone and avoid all other non steroidal antiinflammatory medications due to risk of kidney damage and gastrointestinal bleeding.   Patient requested longer steroid dose pack- provider discussed with Dr. Rosanna Randy and will prescribe as documented above in medications ordered. He is to follow up if symptoms continue or worsen and see pulmonary or his primary care provider if needed.   Gave and reviewed After Visit Summary(AVS) with patient.   Advised patient call the office or your primary care doctor for  an appointment if no improvement within 72 hours or if any symptoms change or worsen at any time  Advised ER or urgent Care if after hours or on weekend. Call 911 for emergency symptoms at any time.Patinet verbalized understanding of all instructions given/reviewed and treatment plan and has no further questions or concerns at this time.    Provider thoroughly discussed in collaboration above plan with supervising physician Dr. Miguel Aschoff who is in agreement with the care plan as above.

## 2018-10-11 NOTE — Patient Instructions (Addendum)
Provider recommends regular follow ups with primary care given chronic recurrent conditions and hospitalizations in past.  Do not take Mobic with Prednisone or and Ibuprofen or NSAIDS.  Follow up with pulmonary as needed or if worsens.     Sinusitis, Adult Sinusitis is soreness and swelling (inflammation) of your sinuses. Sinuses are hollow spaces in the bones around your face. They are located:  Around your eyes.  In the middle of your forehead.  Behind your nose.  In your cheekbones. Your sinuses and nasal passages are lined with a fluid called mucus. Mucus drains out of your sinuses. Swelling can trap mucus in your sinuses. This lets germs (bacteria, virus, or fungus) grow, which leads to infection. Most of the time, this condition is caused by a virus. What are the causes? This condition is caused by:  Allergies.  Asthma.  Germs.  Things that block your nose or sinuses.  Growths in the nose (nasal polyps).  Chemicals or irritants in the air.  Fungus (rare). What increases the risk? You are more likely to develop this condition if:  You have a weak body defense system (immune system).  You do a lot of swimming or diving.  You use nasal sprays too much.  You smoke. What are the signs or symptoms? The main symptoms of this condition are pain and a feeling of pressure around the sinuses. Other symptoms include:  Stuffy nose (congestion).  Runny nose (drainage).  Swelling and warmth in the sinuses.  Headache.  Toothache.  A cough that may get worse at night.  Mucus that collects in the throat or the back of the nose (postnasal drip).  Being unable to smell and taste.  Being very tired (fatigue).  A fever.  Sore throat.  Bad breath. How is this diagnosed? This condition is diagnosed based on:  Your symptoms.  Your medical history.  A physical exam.  Tests to find out if your condition is short-term (acute) or long-term (chronic). Your doctor  may: ? Check your nose for growths (polyps). ? Check your sinuses using a tool that has a light (endoscope). ? Check for allergies or germs. ? Do imaging tests, such as an MRI or CT scan. How is this treated? Treatment for this condition depends on the cause and whether it is short-term or long-term.  If caused by a virus, your symptoms should go away on their own within 10 days. You may be given medicines to relieve symptoms. They include: ? Medicines that shrink swollen tissue in the nose. ? Medicines that treat allergies (antihistamines). ? A spray that treats swelling of the nostrils. ? Rinses that help get rid of thick mucus in your nose (nasal saline washes).  If caused by bacteria, your doctor may wait to see if you will get better without treatment. You may be given antibiotic medicine if you have: ? A very bad infection. ? A weak body defense system.  If caused by growths in the nose, you may need to have surgery. Follow these instructions at home: Medicines  Take, use, or apply over-the-counter and prescription medicines only as told by your doctor. These may include nasal sprays.  If you were prescribed an antibiotic medicine, take it as told by your doctor. Do not stop taking the antibiotic even if you start to feel better. Hydrate and humidify   Drink enough water to keep your pee (urine) pale yellow.  Use a cool mist humidifier to keep the humidity level in your home above  50%.  Breathe in steam for 10-15 minutes, 3-4 times a day, or as told by your doctor. You can do this in the bathroom while a hot shower is running.  Try not to spend time in cool or dry air. Rest  Rest as much as you can.  Sleep with your head raised (elevated).  Make sure you get enough sleep each night. General instructions   Put a warm, moist washcloth on your face 3-4 times a day, or as often as told by your doctor. This will help with discomfort.  Wash your hands often with soap  and water. If there is no soap and water, use hand sanitizer.  Do not smoke. Avoid being around people who are smoking (secondhand smoke).  Keep all follow-up visits as told by your doctor. This is important. Contact a doctor if:  You have a fever.  Your symptoms get worse.  Your symptoms do not get better within 10 days. Get help right away if:  You have a very bad headache.  You cannot stop throwing up (vomiting).  You have very bad pain or swelling around your face or eyes.  You have trouble seeing.  You feel confused.  Your neck is stiff.  You have trouble breathing. Summary  Sinusitis is swelling of your sinuses. Sinuses are hollow spaces in the bones around your face.  This condition is caused by tissues in your nose that become inflamed or swollen. This traps germs. These can lead to infection.  If you were prescribed an antibiotic medicine, take it as told by your doctor. Do not stop taking it even if you start to feel better.  Keep all follow-up visits as told by your doctor. This is important. This information is not intended to replace advice given to you by your health care provider. Make sure you discuss any questions you have with your health care provider. Document Released: 02/22/2008 Document Revised: 02/05/2018 Document Reviewed: 02/05/2018 Elsevier Interactive Patient Education  2019 [place]. Prednisone tablets What is this medicine? PREDNISONE (PRED ni sone) is a corticosteroid. It is commonly used to treat inflammation of the skin, joints, lungs, and other organs. Common conditions treated include asthma, allergies, and arthritis. It is also used for other conditions, such as blood disorders and diseases of the adrenal glands. This medicine may be used for other purposes; ask your health care provider or pharmacist if you have questions. COMMON BRAND NAME(S): Deltasone, Predone, Sterapred, Sterapred DS What should I tell my health care provider  before I take this medicine? They need to know if you have any of these conditions: -Cushing's syndrome -diabetes -glaucoma -heart disease -high blood pressure -infection (especially a virus infection such as chickenpox, cold sores, or herpes) -kidney disease -liver disease -mental illness -myasthenia gravis -osteoporosis -seizures -stomach or intestine problems -thyroid disease -an unusual or allergic reaction to lactose, prednisone, other medicines, foods, dyes, or preservatives -pregnant or trying to get pregnant -breast-feeding How should I use this medicine? Take this medicine by mouth with a glass of water. Follow the directions on the prescription label. Take this medicine with food. If you are taking this medicine once a day, take it in the morning. Do not take more medicine than you are told to take. Do not suddenly stop taking your medicine because you may develop a severe reaction. Your doctor will tell you how much medicine to take. If your doctor wants you to stop the medicine, the dose may be slowly lowered over time  to avoid any side effects. Talk to your pediatrician regarding the use of this medicine in children. Special care may be needed. Overdosage: If you think you have taken too much of this medicine contact a poison control center or emergency room at once. NOTE: This medicine is only for you. Do not share this medicine with others. What if I miss a dose? If you miss a dose, take it as soon as you can. If it is almost time for your next dose, talk to your doctor or health care professional. You may need to miss a dose or take an extra dose. Do not take double or extra doses without advice. What may interact with this medicine? Do not take this medicine with any of the following medications: -metyrapone -mifepristone This medicine may also interact with the following medications: -aminoglutethimide -amphotericin B -aspirin and aspirin-like  medicines -barbiturates -certain medicines for diabetes, like glipizide or glyburide -cholestyramine -cholinesterase inhibitors -cyclosporine -digoxin -diuretics -ephedrine -male hormones, like estrogens and birth control pills -isoniazid -ketoconazole -NSAIDS, medicines for pain and inflammation, like ibuprofen or naproxen -phenytoin -rifampin -toxoids -vaccines -warfarin This list may not describe all possible interactions. Give your health care provider a list of all the medicines, herbs, non-prescription drugs, or dietary supplements you use. Also tell them if you smoke, drink alcohol, or use illegal drugs. Some items may interact with your medicine. What should I watch for while using this medicine? Visit your doctor or health care professional for regular checks on your progress. If you are taking this medicine over a prolonged period, carry an identification card with your name and address, the type and dose of your medicine, and your doctor's name and address. This medicine may increase your risk of getting an infection. Tell your doctor or health care professional if you are around anyone with measles or chickenpox, or if you develop sores or blisters that do not heal properly. If you are going to have surgery, tell your doctor or health care professional that you have taken this medicine within the last twelve months. Ask your doctor or health care professional about your diet. You may need to lower the amount of salt you eat. This medicine may increase blood sugar. Ask your healthcare provider if changes in diet or medicines are needed if you have diabetes. What side effects may I notice from receiving this medicine? Side effects that you should report to your doctor or health care professional as soon as possible: -allergic reactions like skin rash, itching or hives, swelling of the face, lips, or tongue -changes in emotions or moods -changes in vision -depressed  mood -eye pain -fever or chills, cough, sore throat, pain or difficulty passing urine -signs and symptoms of high blood sugar such as being more thirsty or hungry or having to urinate more than normal. You may also feel very tired or have blurry vision. -swelling of ankles, feet Side effects that usually do not require medical attention (report to your doctor or health care professional if they continue or are bothersome): -confusion, excitement, restlessness -headache -nausea, vomiting -skin problems, acne, thin and shiny skin -trouble sleeping -weight gain This list may not describe all possible side effects. Call your doctor for medical advice about side effects. You may report side effects to FDA at 1-800-FDA-1088. Where should I keep my medicine? Keep out of the reach of children. Store at room temperature between 15 and 30 degrees C (59 and 86 degrees F). Protect from light. Keep container tightly closed.  Throw away any unused medicine after the expiration date. NOTE: This sheet is a summary. It may not cover all possible information. If you have questions about this medicine, talk to your doctor, pharmacist, or health care provider.  2019 Elsevier/[employer] Standard (2018-06-05 10:54:22)   Amoxicillin; Clavulanic Acid tablets What is this medicine? AMOXICILLIN; CLAVULANIC ACID (a mox i SIL in; KLAV yoo lan ic AS id) is a penicillin antibiotic. It is used to treat certain kinds of bacterial infections. It will not work for colds, flu, or other viral infections. This medicine may be used for other purposes; ask your health care provider or pharmacist if you have questions. COMMON BRAND NAME(S): Augmentin What should I tell my health care provider before I take this medicine? They need to know if you have any of these conditions: -bowel disease, like colitis -kidney disease -liver disease -mononucleosis -an unusual or allergic reaction to amoxicillin, penicillin, cephalosporin, other  antibiotics, clavulanic acid, other medicines, foods, dyes, or preservatives -pregnant or trying to get pregnant -breast-feeding How should I use this medicine? Take this medicine by mouth with a full glass of water. Follow the directions on the prescription label. Take at the start of a meal. Do not crush or chew. If the tablet has a score line, you may cut it in half at the score line for easier swallowing. Take your medicine at regular intervals. Do not take your medicine more often than directed. Take all of your medicine as directed even if you think you are better. Do not skip doses or stop your medicine early. Talk to your pediatrician regarding the use of this medicine in children. Special care may be needed. Overdosage: If you think you have taken too much of this medicine contact a poison control center or emergency room at once. NOTE: This medicine is only for you. Do not share this medicine with others. What if I miss a dose? If you miss a dose, take it as soon as you can. If it is almost time for your next dose, take only that dose. Do not take double or extra doses. What may interact with this medicine? -allopurinol -anticoagulants -birth control pills -methotrexate -probenecid This list may not describe all possible interactions. Give your health care provider a list of all the medicines, herbs, non-prescription drugs, or dietary supplements you use. Also tell them if you smoke, drink alcohol, or use illegal drugs. Some items may interact with your medicine. What should I watch for while using this medicine? Tell your doctor or health care professional if your symptoms do not improve. Do not treat diarrhea with over the counter products. Contact your doctor if you have diarrhea that lasts more than 2 days or if it is severe and watery. If you have diabetes, you may get a false-positive result for sugar in your urine. Check with your doctor or health care professional. Birth  control pills may not work properly while you are taking this medicine. Talk to your doctor about using an extra method of birth control. What side effects may I notice from receiving this medicine? Side effects that you should report to your doctor or health care professional as soon as possible: -allergic reactions like skin rash, itching or hives, swelling of the face, lips, or tongue -breathing problems -dark urine -fever or chills, sore throat -redness, blistering, peeling or loosening of the skin, including inside the mouth -seizures -trouble passing urine or change in the amount of urine -unusual bleeding, bruising -unusually weak or  tired -white patches or sores in the mouth or throat Side effects that usually do not require medical attention (report to your doctor or health care professional if they continue or are bothersome): -diarrhea -dizziness -headache -nausea, vomiting -stomach upset -vaginal or anal irritation This list may not describe all possible side effects. Call your doctor for medical advice about side effects. You may report side effects to FDA at 1-800-FDA-1088. Where should I keep my medicine? Keep out of the reach of children. Store at room temperature below 25 degrees C (77 degrees F). Keep container tightly closed. Throw away any unused medicine after the expiration date. NOTE: This sheet is a summary. It may not cover all possible information. If you have questions about this medicine, talk to your doctor, pharmacist, or health care provider.  2019 Elsevier/Gold Standard (2007-11-29 12:04:30)

## 2019-01-11 ENCOUNTER — Other Ambulatory Visit: Payer: Self-pay

## 2019-01-11 ENCOUNTER — Other Ambulatory Visit: Payer: Managed Care, Other (non HMO)

## 2019-01-11 DIAGNOSIS — Z0000 Encounter for general adult medical examination without abnormal findings: Secondary | ICD-10-CM

## 2019-01-11 DIAGNOSIS — Z79899 Other long term (current) drug therapy: Secondary | ICD-10-CM

## 2019-01-11 DIAGNOSIS — E782 Mixed hyperlipidemia: Secondary | ICD-10-CM

## 2019-01-11 DIAGNOSIS — R7309 Other abnormal glucose: Secondary | ICD-10-CM

## 2019-01-12 LAB — CBC WITH DIFFERENTIAL/PLATELET
Basophils Absolute: 0 10*3/uL (ref 0.0–0.2)
Basos: 0 %
EOS (ABSOLUTE): 0.1 10*3/uL (ref 0.0–0.4)
Eos: 1 %
Hematocrit: 43.4 % (ref 37.5–51.0)
Hemoglobin: 14.4 g/dL (ref 13.0–17.7)
Immature Grans (Abs): 0 10*3/uL (ref 0.0–0.1)
Immature Granulocytes: 0 %
Lymphocytes Absolute: 2.1 10*3/uL (ref 0.7–3.1)
Lymphs: 24 %
MCH: 29.5 pg (ref 26.6–33.0)
MCHC: 33.2 g/dL (ref 31.5–35.7)
MCV: 89 fL (ref 79–97)
Monocytes Absolute: 0.6 10*3/uL (ref 0.1–0.9)
Monocytes: 7 %
Neutrophils Absolute: 5.9 10*3/uL (ref 1.4–7.0)
Neutrophils: 68 %
Platelets: 349 10*3/uL (ref 150–450)
RBC: 4.88 x10E6/uL (ref 4.14–5.80)
RDW: 13.9 % (ref 11.6–15.4)
WBC: 8.7 10*3/uL (ref 3.4–10.8)

## 2019-01-12 LAB — COMPREHENSIVE METABOLIC PANEL
ALT: 39 IU/L (ref 0–44)
AST: 19 IU/L (ref 0–40)
Albumin/Globulin Ratio: 2 (ref 1.2–2.2)
Albumin: 4.3 g/dL (ref 4.0–5.0)
Alkaline Phosphatase: 125 IU/L — ABNORMAL HIGH (ref 39–117)
BUN/Creatinine Ratio: 12 (ref 9–20)
BUN: 12 mg/dL (ref 6–24)
Bilirubin Total: 0.5 mg/dL (ref 0.0–1.2)
CO2: 24 mmol/L (ref 20–29)
Calcium: 9.4 mg/dL (ref 8.7–10.2)
Chloride: 104 mmol/L (ref 96–106)
Creatinine, Ser: 1.04 mg/dL (ref 0.76–1.27)
GFR calc Af Amer: 97 mL/min/{1.73_m2} (ref >59)
GFR calc non Af Amer: 84 mL/min/{1.73_m2} (ref >59)
Globulin, Total: 2.2 g/dL (ref 1.5–4.5)
Glucose: 108 mg/dL — ABNORMAL HIGH (ref 65–99)
Potassium: 4.2 mmol/L (ref 3.5–5.2)
Sodium: 143 mmol/L (ref 134–144)
Total Protein: 6.5 g/dL (ref 6.0–8.5)

## 2019-01-12 LAB — LIPID PANEL WITH LDL/HDL RATIO
Cholesterol, Total: 213 mg/dL — ABNORMAL HIGH (ref 100–199)
HDL: 36 mg/dL — ABNORMAL LOW (ref >39)
LDL Calculated: 150 mg/dL — ABNORMAL HIGH (ref 0–99)
LDl/HDL Ratio: 4.2 ratio — ABNORMAL HIGH (ref 0.0–3.6)
Triglycerides: 133 mg/dL (ref 0–149)
VLDL Cholesterol Cal: 27 mg/dL (ref 5–40)

## 2019-01-12 LAB — URINALYSIS, ROUTINE W REFLEX MICROSCOPIC
Bilirubin, UA: NEGATIVE
Glucose, UA: NEGATIVE
Ketones, UA: NEGATIVE
Leukocytes,UA: NEGATIVE
Nitrite, UA: NEGATIVE
Protein,UA: NEGATIVE
RBC, UA: NEGATIVE
Specific Gravity, UA: 1.022 (ref 1.005–1.030)
Urobilinogen, Ur: 0.2 mg/dL (ref 0.2–1.0)
pH, UA: 5 (ref 5.0–7.5)

## 2019-01-12 LAB — HGB A1C W/O EAG: Hgb A1c MFr Bld: 6 % — ABNORMAL HIGH (ref 4.8–5.6)

## 2019-01-29 ENCOUNTER — Emergency Department
Admission: EM | Admit: 2019-01-29 | Discharge: 2019-01-29 | Disposition: A | Discharge status: Home / Self Care | Payer: No Typology Code available for payment source | Attending: Emergency Medicine | Admitting: Emergency Medicine

## 2019-01-29 ENCOUNTER — Emergency Department: Payer: No Typology Code available for payment source

## 2019-01-29 ENCOUNTER — Other Ambulatory Visit: Payer: Self-pay

## 2019-01-29 ENCOUNTER — Encounter: Payer: Self-pay | Admitting: *Deleted

## 2019-01-29 DIAGNOSIS — S0990XA Unspecified injury of head, initial encounter: Secondary | ICD-10-CM

## 2019-01-29 DIAGNOSIS — Y93F2 Activity, caregiving, lifting: Secondary | ICD-10-CM | POA: Diagnosis not present

## 2019-01-29 DIAGNOSIS — Y9289 Other specified places as the place of occurrence of the external cause: Secondary | ICD-10-CM | POA: Insufficient documentation

## 2019-01-29 DIAGNOSIS — G44319 Acute post-traumatic headache, not intractable: Secondary | ICD-10-CM | POA: Diagnosis not present

## 2019-01-29 DIAGNOSIS — W51XXXA Accidental striking against or bumped into by another person, initial encounter: Secondary | ICD-10-CM | POA: Diagnosis not present

## 2019-01-29 DIAGNOSIS — Y990 Civilian activity done for income or pay: Secondary | ICD-10-CM | POA: Diagnosis not present

## 2019-01-29 DIAGNOSIS — I10 Essential (primary) hypertension: Secondary | ICD-10-CM | POA: Diagnosis not present

## 2019-01-29 DIAGNOSIS — Z79899 Other long term (current) drug therapy: Secondary | ICD-10-CM | POA: Insufficient documentation

## 2019-01-29 MED ORDER — DIPHENHYDRAMINE HCL 50 MG/ML IJ SOLN
50.0000 mg | Freq: Once | INTRAMUSCULAR | Status: AC
  Administered 2019-01-29: 19:00:00 50 mg via INTRAMUSCULAR
  Filled 2019-01-29: qty 1

## 2019-01-29 MED ORDER — ONDANSETRON 8 MG PO TBDP
8.0000 mg | ORAL_TABLET | Freq: Once | ORAL | Status: AC
Start: 2019-01-29 — End: 2019-01-29
  Administered 2019-01-29: 8 mg via ORAL
  Filled 2019-01-29: qty 1

## 2019-01-29 MED ORDER — PROMETHAZINE HCL 25 MG/ML IJ SOLN
25.0000 mg | Freq: Once | INTRAMUSCULAR | Status: AC
  Administered 2019-01-29: 19:00:00 25 mg via INTRAMUSCULAR

## 2019-01-29 MED ORDER — KETOROLAC TROMETHAMINE 30 MG/ML IJ SOLN
30.0000 mg | Freq: Once | INTRAMUSCULAR | Status: AC
  Administered 2019-01-29: 30 mg via INTRAMUSCULAR
  Filled 2019-01-29: qty 1
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
## 2019-04-12 ENCOUNTER — Other Ambulatory Visit: Payer: Self-pay

## 2019-04-12 DIAGNOSIS — Z0000 Encounter for general adult medical examination without abnormal findings: Secondary | ICD-10-CM

## 2019-06-10 ENCOUNTER — Encounter: Payer: Self-pay | Admitting: Emergency Medicine

## 2019-06-10 ENCOUNTER — Emergency Department: Payer: No Typology Code available for payment source

## 2019-06-10 ENCOUNTER — Emergency Department
Admission: EM | Admit: 2019-06-10 | Discharge: 2019-06-10 | Disposition: A | Discharge status: Home / Self Care | Payer: No Typology Code available for payment source | Attending: Student in an Organized Health Care Education/Training Program | Admitting: Student in an Organized Health Care Education/Training Program

## 2019-06-10 DIAGNOSIS — M5441 Lumbago with sciatica, right side: Secondary | ICD-10-CM | POA: Insufficient documentation

## 2019-06-10 DIAGNOSIS — Z79899 Other long term (current) drug therapy: Secondary | ICD-10-CM | POA: Diagnosis not present

## 2019-06-10 DIAGNOSIS — M545 Low back pain: Secondary | ICD-10-CM | POA: Diagnosis present

## 2019-06-10 LAB — CBC WITH DIFFERENTIAL/PLATELET
Abs Immature Granulocytes: 0.01 10*3/uL (ref 0.00–0.07)
Basophils Absolute: 0 10*3/uL (ref 0.0–0.1)
Basophils Relative: 0 %
Eosinophils Absolute: 0.1 10*3/uL (ref 0.0–0.5)
Eosinophils Relative: 2 %
HCT: 44.2 % (ref 39.0–52.0)
Hemoglobin: 14.6 g/dL (ref 13.0–17.0)
Immature Granulocytes: 0 %
Lymphocytes Relative: 29 %
Lymphs Abs: 1.9 10*3/uL (ref 0.7–4.0)
MCH: 28.5 pg (ref 26.0–34.0)
MCHC: 33 g/dL (ref 30.0–36.0)
MCV: 86.2 fL (ref 80.0–100.0)
Monocytes Absolute: 0.5 10*3/uL (ref 0.1–1.0)
Monocytes Relative: 7 %
Neutro Abs: 3.9 10*3/uL (ref 1.7–7.7)
Neutrophils Relative %: 62 %
Platelets: 325 10*3/uL (ref 150–400)
RBC: 5.13 MIL/uL (ref 4.22–5.81)
RDW: 13.3 % (ref 11.5–15.5)
WBC: 6.4 10*3/uL (ref 4.0–10.5)
nRBC: 0 % (ref 0.0–0.2)

## 2019-06-10 LAB — COMPREHENSIVE METABOLIC PANEL
ALT: 39 U/L (ref 0–44)
AST: 24 U/L (ref 15–41)
Albumin: 4.1 g/dL (ref 3.5–5.0)
Alkaline Phosphatase: 99 U/L (ref 38–126)
Anion gap: 7 (ref 5–15)
BUN: 15 mg/dL (ref 6–20)
CO2: 27 mmol/L (ref 22–32)
Calcium: 8.9 mg/dL (ref 8.9–10.3)
Chloride: 106 mmol/L (ref 98–111)
Creatinine, Ser: 1.09 mg/dL (ref 0.61–1.24)
GFR calc Af Amer: >60 mL/min (ref >60)
GFR calc non Af Amer: >60 mL/min (ref >60)
Glucose, Bld: 116 mg/dL — ABNORMAL HIGH (ref 70–99)
Potassium: 3.9 mmol/L (ref 3.5–5.1)
Sodium: 140 mmol/L (ref 135–145)
Total Bilirubin: 0.6 mg/dL (ref 0.3–1.2)
Total Protein: 7.2 g/dL (ref 6.5–8.1)

## 2019-06-10 MED ORDER — CYCLOBENZAPRINE HCL 10 MG PO TABS: 10.0000 mg | ORAL_TABLET | Freq: Three times a day (TID) | ORAL | 0 refills | Status: AC

## 2019-06-10 MED ORDER — MORPHINE SULFATE (PF) 4 MG/ML IV SOLN
4.0000 mg | INTRAVENOUS | Status: DC | PRN
  Filled 2019-06-10: qty 1

## 2019-06-10 MED ORDER — ONDANSETRON HCL 4 MG/2ML IJ SOLN
4.0000 mg | Freq: Once | INTRAMUSCULAR | Status: DC
  Filled 2019-06-10: qty 2

## 2019-06-10 NOTE — ED Notes (Signed)
Patient with 34 ml post void residual. MD at bedside.

## 2019-06-10 NOTE — ED Notes (Signed)
Patient transported to MRI 

## 2019-06-10 NOTE — Discharge Instructions (Addendum)

## 2019-06-10 NOTE — ED Triage Notes (Signed)
Pt from [facility] with c/o back pain from work injury last week. Pt has + bladder sensation during exam. PT ambulatory

## 2019-06-10 NOTE — ED Notes (Signed)
Patient ambulatory to lobby with steady gait and NAD noted. Verbalized understanding of discharge instructions and follow-up care.  

## 2019-06-10 NOTE — ED Provider Notes (Signed)
[facility] Emergency Department Provider Note    First MD Initiated Contact with Patient 06/10/19 [id number]     (approximate)  I have reviewed the triage vital signs and the nursing notes.   HISTORY  Chief Complaint Back Pain    HPI AZTLAN BILLIPS is a 48 y.o. male presents the ER for severe low back pain is progressively worsening since the weekend.  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  He felt sudden back pain then.  Has not had any numbness or tingling.  Was over a [place] [facility] this morning for work-up and follow-up and while being evaluated felt like he was having difficulty controlling his bladder.  He denies any saddle anesthesia.  No new numbness or tingling.  Able to ambulate, but does have back pain.    Past Medical History:  Diagnosis Date  . AVN (avascular necrosis of bone) ([place])    r femural head  . Complication of anesthesia    pt. states that he wakes up during procedures  . Pneumonia    Family History  Problem Relation Age of Onset  . Cancer Mother   . Cancer Sister   . Colon cancer Sister    Past Surgical History:  Procedure Laterality Date  . COLONOSCOPY N/A 04/01/2015   Procedure: COLONOSCOPY;  Surgeon: [name], MD;  Location: [place];  Service: Gastroenterology;  Laterality: N/A;  . GANGLION CYST EXCISION    . POLYPECTOMY  04/01/2015   Procedure: POLYPECTOMY;  Surgeon: [name], MD;  Location: [place];  Service: Gastroenterology;;  . TONGUE FLAP RELEASE    . TOTAL HIP ARTHROPLASTY  10/07/2011   Procedure: TOTAL HIP ARTHROPLASTY;  Surgeon: Kerin Salen, MD;  Location: [place];  Service: Orthopedics;  Laterality: Right;  . TOTAL HIP ARTHROPLASTY Left 02/17/2017   Procedure: TOTAL HIP ARTHROPLASTY ANTERIOR APPROACH;  Surgeon: Frederik Pear, MD;  Location: [place];  Service: Orthopedics;  Laterality: Left;   Patient Active Problem List   Diagnosis Date Noted  . Bronchitis- mild  10/11/2018  . Cough- post nasal drip  10/11/2018  . Acute non-recurrent maxillary sinusitis 10/11/2018  . CAP (community acquired pneumonia) 10/02/2017  . AVN of femur ([place]) 02/17/2017  . Fracture of femoral neck, left ([place]) 02/14/2017  . Hyperlipemia, mixed 10/22/2015  . Adult ADHD 07/17/2015  . Kidney stones 07/17/2015  . Avascular necrosis of hip Right with collapse 10/07/2011      Prior to Admission medications   Medication Sig Start Date End Date Taking? Authorizing Provider  amphetamine-dextroamphetamine (ADDERALL XR) 25 MG 24 hr capsule Take 25 mg by mouth daily. 04/10/19  Yes [provider]  benazepril (LOTENSIN) 20 MG tablet Take 20 mg by mouth daily.  06/22/18 06/22/19 Yes [provider]  omeprazole (PRILOSEC) 20 MG capsule Take 20 mg by mouth 2 (two) times daily before a meal.  04/10/18  Yes [provider]  traZODone (DESYREL) 150 MG tablet TK 1 OR 2 TS PO QHS 12/17/18  Yes [provider]  budesonide (PULMICORT FLEXHALER) 180 MCG/ACT inhaler Inhale into the lungs. 10/12/17 10/12/18  [provider]  cefUROXime (CEFTIN) 500 MG tablet TK 1 T PO Q 12 H FOR 10 DAYS 12/26/18   [provider]  cyclobenzaprine (FLEXERIL) 10 MG tablet Take 1 tablet (10 mg total) by mouth 3 (three) times daily. 06/10/19   Merlyn Lot, MD  fluticasone (FLONASE) 50 MCG/ACT nasal spray Place 1-2 sprays into the nose daily.  08/08/18   [provider]  fluticasone (FLOVENT HFA) 110 MCG/ACT inhaler Inhale into the lungs. 10/19/18 10/19/19  [provider]  ketorolac (TORADOL) 10 MG tablet Take 10 mg by mouth every 6 (six) hours.  08/08/18   [provider]  meloxicam (MOBIC) 7.5 MG tablet Take 7.5 mg by mouth daily as needed.    [provider]  montelukast (SINGULAIR) 10 MG tablet Take 10 mg by mouth at bedtime.  11/02/17 11/02/18  [provider]  ondansetron (ZOFRAN) 4  MG tablet Take 4 mg by mouth every 8 (eight) hours as needed.  09/24/18   [provider]  tamsulosin (FLOMAX) 0.4 MG CAPS capsule Take 0.4 mg by mouth daily.  08/08/18   [provider]  tiotropium (SPIRIVA) 18 MCG inhalation capsule Place 18 mcg into inhaler and inhale daily.  11/02/17 11/02/18  [provider]    Allergies No known allergies    Social History Social History   Tobacco Use  . Smoking status: Never Smoker  . Smokeless tobacco: Never Used  Substance Use Topics  . Alcohol use: No  . Drug use: No    Review of Systems Patient denies headaches, rhinorrhea, blurry vision, numbness, shortness of breath, chest pain, edema, cough, abdominal pain, nausea, vomiting, diarrhea, dysuria, fevers, rashes or hallucinations unless otherwise stated above in HPI. ____________________________________________   PHYSICAL EXAM:  VITAL SIGNS: Vitals:   06/10/19 1112 06/10/19 1113  BP: (!) 148/101   Pulse:  79  Resp:    Temp:    SpO2:  97%    Constitutional: Alert and oriented.  Eyes: Conjunctivae are normal.  Head: Atraumatic. Nose: No congestion/rhinnorhea. Mouth/Throat: Mucous membranes are moist.   Neck: No stridor. Painless ROM.  Cardiovascular: Normal rate, regular rhythm. Grossly normal heart sounds.  Good peripheral circulation. Respiratory: Normal respiratory effort.  No retractions. Lungs CTAB. Gastrointestinal: Soft and nontender. No distention. No abdominal bruits. No CVA tenderness. Genitourinary:  Musculoskeletal: No lower extremity tenderness nor edema.  No joint effusions. Neurologic:  CN- intact.  No facial droop, Normal FNF.  Normal heel to shin.  Sensation intact bilaterally. Normal speech and language. No gross focal neurologic deficits are appreciated. No gait instability. Skin:  Skin is warm, dry and intact. No rash noted. Psychiatric: Mood and affect are normal. Speech and behavior are normal.   ____________________________________________   LABS (all labs ordered are listed, but only abnormal results are displayed)  Results for orders placed or performed during the hospital encounter of 06/10/19 (from the past 24 hour(s))  CBC with Differential/Platelet     Status: None   Collection Time: 06/10/19  9:51 AM  Result Value Ref Range   WBC 6.4 4.0 - 10.5 K/uL   RBC 5.13 4.22 - 5.81 MIL/uL   Hemoglobin 14.6 13.0 - 17.0 g/dL   HCT 44.2 39.0 - 52.0 %   MCV 86.2 80.0 - 100.0 fL   MCH 28.5 26.0 - 34.0 pg   MCHC 33.0 30.0 - 36.0 g/dL   RDW 13.3 11.5 - 15.5 %   Platelets 325 150 - 400 K/uL   nRBC 0.0 0.0 - 0.2 %   Neutrophils Relative % 62 %   Neutro Abs 3.9 1.7 - 7.7 K/uL   Lymphocytes Relative 29 %   Lymphs Abs 1.9 0.7 - 4.0 K/uL   Monocytes Relative 7 %   Monocytes Absolute 0.5 0.1 - 1.0 K/uL  Eosinophils Relative 2 %   Eosinophils Absolute 0.1 0.0 - 0.5 K/uL   Basophils Relative 0 %   Basophils Absolute 0.0 0.0 - 0.1 K/uL   Immature Granulocytes 0 %   Abs Immature Granulocytes 0.01 0.00 - 0.07 K/uL  Comprehensive metabolic panel     Status: Abnormal   Collection Time: 06/10/19  9:51 AM  Result Value Ref Range   Sodium 140 135 - 145 mmol/L   Potassium 3.9 3.5 - 5.1 mmol/L   Chloride 106 98 - 111 mmol/L   CO2 27 22 - 32 mmol/L   Glucose, Bld 116 (H) 70 - 99 mg/dL   BUN 15 6 - 20 mg/dL   Creatinine, Ser 1.09 0.61 - 1.24 mg/dL   Calcium 8.9 8.9 - 10.3 mg/dL   Total Protein 7.2 6.5 - 8.1 g/dL   Albumin 4.1 3.5 - 5.0 g/dL   AST 24 15 - 41 U/L   ALT 39 0 - 44 U/L   Alkaline Phosphatase 99 38 - 126 U/L   Total Bilirubin 0.6 0.3 - 1.2 mg/dL   GFR calc non Af Amer >60 >60 mL/min   GFR calc Af Amer >60 >60 mL/min   Anion gap 7 5 - 15   ____________________________________________  EKG ____________________________________________  RADIOLOGY  I personally reviewed all radiographic images ordered to evaluate for the above acute complaints and reviewed radiology  reports and findings.  These findings were personally discussed with the patient.  Please see medical record for radiology report.  ____________________________________________   PROCEDURES  Procedure(s) performed:  Procedures    Critical Care performed: no ____________________________________________   INITIAL IMPRESSION / ASSESSMENT AND PLAN / ED COURSE  Pertinent labs & imaging results that were available during my care of the patient were reviewed by me and considered in my medical decision making (see chart for details).   DDX: radiculopathy, sciatica, cauda equina, msk strain  VIRON KONRATH is a 48 y.o. who presents to the ED with low back pain recent back injury any change in bladder sensation this morning acrinol to clinic.  Given his injury and concern for new neuro deficits will order MRI.  He has intact reflexes as well as downgoing Babinski's and no evidence of bladder retention.  May have sciatica but will evaluate for acute cord impingement given this change with MRI. Clinical Course as of Jun 09 1142  Mon Jun 10, 2019  1100 MRI is reassuring.  Patient able to ambulate with steady gait.  Neuro exam is nonfocal.  Patient stable and appropriate for outpatient follow-up.   [PR]    Clinical Course User Index [PR] Merlyn Lot, MD    The patient was evaluated in Emergency Department today for the symptoms described in the history of present illness. He/she was evaluated in the context of the global COVID-19 pandemic, which necessitated consideration that the patient might be at risk for infection with the SARS-CoV-2 virus that causes COVID-19. Institutional protocols and algorithms that pertain to the evaluation of patients at risk for COVID-19 are in a state of rapid change based on information released by regulatory bodies including the CDC and federal and state organizations. These policies and algorithms were followed during the patient's care in the ED.  As part  of my medical decision making, I reviewed the following data within the [place] notes reviewed and incorporated, Labs reviewed, notes from prior ED visits and [place] Controlled Substance Database   ____________________________________________   FINAL CLINICAL IMPRESSION(S) /  ED DIAGNOSES  Final diagnoses:  Acute right-sided low back pain with right-sided sciatica      NEW MEDICATIONS STARTED DURING THIS VISIT:  Discharge Medication List as of 06/10/2019 11:05 AM       Note:  This document was prepared using Dragon voice recognition software and may include unintentional dictation errors.    Merlyn Lot, MD 06/10/19 1143

## 2019-06-21 ENCOUNTER — Other Ambulatory Visit: Payer: Managed Care, Other (non HMO)

## 2019-06-21 ENCOUNTER — Other Ambulatory Visit: Payer: Self-pay

## 2019-06-21 DIAGNOSIS — Z0000 Encounter for general adult medical examination without abnormal findings: Secondary | ICD-10-CM | POA: Diagnosis not present

## 2019-06-22 LAB — URINALYSIS, ROUTINE W REFLEX MICROSCOPIC
Bilirubin, UA: NEGATIVE
Glucose, UA: NEGATIVE
Ketones, UA: NEGATIVE
Leukocytes,UA: NEGATIVE
Nitrite, UA: NEGATIVE
Protein,UA: NEGATIVE
RBC, UA: NEGATIVE
Specific Gravity, UA: 1.018 (ref 1.005–1.030)
Urobilinogen, Ur: 0.2 mg/dL (ref 0.2–1.0)
pH, UA: 5.5 (ref 5.0–7.5)

## 2019-06-22 LAB — CBC WITH DIFFERENTIAL/PLATELET
Basophils Absolute: 0 10*3/uL (ref 0.0–0.2)
Basos: 0 %
EOS (ABSOLUTE): 0.1 10*3/uL (ref 0.0–0.4)
Eos: 1 %
Hematocrit: 44.8 % (ref 37.5–51.0)
Hemoglobin: 14.9 g/dL (ref 13.0–17.7)
Immature Grans (Abs): 0 10*3/uL (ref 0.0–0.1)
Immature Granulocytes: 0 %
Lymphocytes Absolute: 2.1 10*3/uL (ref 0.7–3.1)
Lymphs: 30 %
MCH: 28.9 pg (ref 26.6–33.0)
MCHC: 33.3 g/dL (ref 31.5–35.7)
MCV: 87 fL (ref 79–97)
Monocytes Absolute: 0.4 10*3/uL (ref 0.1–0.9)
Monocytes: 6 %
Neutrophils Absolute: 4.4 10*3/uL (ref 1.4–7.0)
Neutrophils: 63 %
Platelets: 326 10*3/uL (ref 150–450)
RBC: 5.16 x10E6/uL (ref 4.14–5.80)
RDW: 13.5 % (ref 11.6–15.4)
WBC: 7 10*3/uL (ref 3.4–10.8)

## 2019-06-22 LAB — HGB A1C W/O EAG: Hgb A1c MFr Bld: 6 % — ABNORMAL HIGH (ref 4.8–5.6)

## 2019-06-22 LAB — COMPREHENSIVE METABOLIC PANEL
ALT: 34 IU/L (ref 0–44)
AST: 22 IU/L (ref 0–40)
Albumin/Globulin Ratio: 1.8 (ref 1.2–2.2)
Albumin: 4.3 g/dL (ref 4.0–5.0)
Alkaline Phosphatase: 128 IU/L — ABNORMAL HIGH (ref 39–117)
BUN/Creatinine Ratio: 12 (ref 9–20)
BUN: 13 mg/dL (ref 6–24)
Bilirubin Total: 0.3 mg/dL (ref 0.0–1.2)
CO2: 24 mmol/L (ref 20–29)
Calcium: 9.2 mg/dL (ref 8.7–10.2)
Chloride: 104 mmol/L (ref 96–106)
Creatinine, Ser: 1.07 mg/dL (ref 0.76–1.27)
GFR calc Af Amer: 94 mL/min/{1.73_m2} (ref >59)
GFR calc non Af Amer: 81 mL/min/{1.73_m2} (ref >59)
Globulin, Total: 2.4 g/dL (ref 1.5–4.5)
Glucose: 112 mg/dL — ABNORMAL HIGH (ref 65–99)
Potassium: 4 mmol/L (ref 3.5–5.2)
Sodium: 142 mmol/L (ref 134–144)
Total Protein: 6.7 g/dL (ref 6.0–8.5)

## 2019-06-22 LAB — LIPID PANEL WITH LDL/HDL RATIO
Cholesterol, Total: 200 mg/dL — ABNORMAL HIGH (ref 100–199)
HDL: 35 mg/dL — ABNORMAL LOW (ref >39)
LDL Chol Calc (NIH): 138 mg/dL — ABNORMAL HIGH (ref 0–99)
LDL/HDL Ratio: 3.9 ratio — ABNORMAL HIGH (ref 0.0–3.6)
Triglycerides: 149 mg/dL (ref 0–149)
VLDL Cholesterol Cal: 27 mg/dL (ref 5–40)

## 2019-06-22 LAB — TSH: TSH: 2.74 u[IU]/mL (ref 0.450–4.500)

## 2019-06-22 LAB — PSA: Prostate Specific Ag, Serum: 0.3 ng/mL (ref 0.0–4.0)

## 2019-09-27 ENCOUNTER — Emergency Department
Admission: EM | Admit: 2019-09-27 | Discharge: 2019-09-28 | Disposition: A | Discharge status: Home / Self Care | Payer: Managed Care, Other (non HMO) | Attending: Emergency Medicine | Admitting: Emergency Medicine

## 2019-09-27 ENCOUNTER — Other Ambulatory Visit: Payer: Self-pay

## 2019-09-27 ENCOUNTER — Emergency Department: Payer: Managed Care, Other (non HMO)

## 2019-09-27 DIAGNOSIS — Z20822 Contact with and (suspected) exposure to covid-19: Secondary | ICD-10-CM | POA: Insufficient documentation

## 2019-09-27 DIAGNOSIS — R0600 Dyspnea, unspecified: Secondary | ICD-10-CM | POA: Diagnosis not present

## 2019-09-27 DIAGNOSIS — F909 Attention-deficit hyperactivity disorder, unspecified type: Secondary | ICD-10-CM | POA: Insufficient documentation

## 2019-09-27 DIAGNOSIS — Z96641 Presence of right artificial hip joint: Secondary | ICD-10-CM | POA: Insufficient documentation

## 2019-09-27 DIAGNOSIS — R112 Nausea with vomiting, unspecified: Secondary | ICD-10-CM | POA: Diagnosis not present

## 2019-09-27 DIAGNOSIS — R0602 Shortness of breath: Secondary | ICD-10-CM

## 2019-09-27 DIAGNOSIS — M79601 Pain in right arm: Secondary | ICD-10-CM | POA: Diagnosis not present

## 2019-09-27 DIAGNOSIS — Z96642 Presence of left artificial hip joint: Secondary | ICD-10-CM | POA: Insufficient documentation

## 2019-09-27 DIAGNOSIS — M25511 Pain in right shoulder: Secondary | ICD-10-CM

## 2019-09-27 LAB — TROPONIN I (HIGH SENSITIVITY)
Troponin I (High Sensitivity): 7 ng/L (ref <18)
Troponin I (High Sensitivity): 5 ng/L (ref <18)

## 2019-09-27 LAB — COMPREHENSIVE METABOLIC PANEL
ALT: 38 U/L (ref 0–44)
AST: 30 U/L (ref 15–41)
Albumin: 4.3 g/dL (ref 3.5–5.0)
Alkaline Phosphatase: 122 U/L (ref 38–126)
Anion gap: 7 (ref 5–15)
BUN: 15 mg/dL (ref 6–20)
CO2: 25 mmol/L (ref 22–32)
Calcium: 8.8 mg/dL — ABNORMAL LOW (ref 8.9–10.3)
Chloride: 102 mmol/L (ref 98–111)
Creatinine, Ser: 1.04 mg/dL (ref 0.61–1.24)
GFR calc Af Amer: >60 mL/min (ref >60)
GFR calc non Af Amer: >60 mL/min (ref >60)
Glucose, Bld: 159 mg/dL — ABNORMAL HIGH (ref 70–99)
Potassium: 3.6 mmol/L (ref 3.5–5.1)
Sodium: 134 mmol/L — ABNORMAL LOW (ref 135–145)
Total Bilirubin: 0.9 mg/dL (ref 0.3–1.2)
Total Protein: 7.6 g/dL (ref 6.5–8.1)

## 2019-09-27 LAB — CBC WITH DIFFERENTIAL/PLATELET
Abs Immature Granulocytes: 0.05 10*3/uL (ref 0.00–0.07)
Basophils Absolute: 0 10*3/uL (ref 0.0–0.1)
Basophils Relative: 0 %
Eosinophils Absolute: 0.1 10*3/uL (ref 0.0–0.5)
Eosinophils Relative: 0 %
HCT: 44.6 % (ref 39.0–52.0)
Hemoglobin: 15 g/dL (ref 13.0–17.0)
Immature Granulocytes: 0 %
Lymphocytes Relative: 11 %
Lymphs Abs: 1.7 10*3/uL (ref 0.7–4.0)
MCH: 28.9 pg (ref 26.0–34.0)
MCHC: 33.6 g/dL (ref 30.0–36.0)
MCV: 85.9 fL (ref 80.0–100.0)
Monocytes Absolute: 1.2 10*3/uL — ABNORMAL HIGH (ref 0.1–1.0)
Monocytes Relative: 8 %
Neutro Abs: 12.2 10*3/uL — ABNORMAL HIGH (ref 1.7–7.7)
Neutrophils Relative %: 81 %
Platelets: 335 10*3/uL (ref 150–400)
RBC: 5.19 MIL/uL (ref 4.22–5.81)
RDW: 13.6 % (ref 11.5–15.5)
WBC: 15.2 10*3/uL — ABNORMAL HIGH (ref 4.0–10.5)
nRBC: 0 % (ref 0.0–0.2)

## 2019-09-27 LAB — RESPIRATORY PANEL BY RT PCR (FLU A&B, COVID)
Influenza A by PCR: NEGATIVE
Influenza B by PCR: NEGATIVE
SARS Coronavirus 2 by RT PCR: NEGATIVE

## 2019-09-27 LAB — FIBRIN DERIVATIVES D-DIMER (ARMC ONLY): Fibrin derivatives D-dimer (ARMC): 285.51 ng/mL (FEU) (ref 0.00–499.00)

## 2019-09-27 MED ORDER — SODIUM CHLORIDE 0.9% FLUSH: 3.0000 mL | Freq: Once | INTRAVENOUS | Status: DC

## 2019-09-27 MED ORDER — OXYCODONE-ACETAMINOPHEN 5-325 MG PO TABS: 1.0000 | ORAL_TABLET | Freq: Three times a day (TID) | ORAL | 0 refills | Status: DC | PRN

## 2019-09-27 MED ORDER — IOHEXOL 350 MG/ML SOLN
100.0000 mL | Freq: Once | INTRAVENOUS | Status: AC | PRN
  Administered 2019-09-27: 23:00:00 100 mL via INTRAVENOUS

## 2019-09-27 MED ORDER — DEXAMETHASONE SODIUM PHOSPHATE 10 MG/ML IJ SOLN
10.0000 mg | Freq: Once | INTRAMUSCULAR | Status: AC
  Administered 2019-09-27: 10 mg via INTRAVENOUS
  Filled 2019-09-27: qty 1

## 2019-09-27 MED ORDER — HYDROMORPHONE HCL 1 MG/ML IJ SOLN
0.5000 mg | Freq: Once | INTRAMUSCULAR | Status: AC
  Administered 2019-09-27: 0.5 mg via INTRAVENOUS
  Filled 2019-09-27: qty 1

## 2019-09-27 MED ORDER — AZITHROMYCIN 250 MG PO TABS: ORAL_TABLET | ORAL | 0 refills | Status: DC

## 2019-09-27 MED ORDER — PROMETHAZINE HCL 25 MG/ML IJ SOLN
25.0000 mg | Freq: Once | INTRAMUSCULAR | Status: AC
  Administered 2019-09-27: 25 mg via INTRAVENOUS
  Filled 2019-09-27: qty 1

## 2019-09-27 MED ORDER — SODIUM CHLORIDE 0.9 % IV SOLN: Freq: Once | INTRAVENOUS | Status: AC

## 2019-09-27 MED ORDER — OXYCODONE-ACETAMINOPHEN 5-325 MG PO TABS
1.0000 | ORAL_TABLET | Freq: Once | ORAL | Status: AC
  Administered 2019-09-28: 1 via ORAL
  Filled 2019-09-27: qty 1

## 2019-09-27 NOTE — ED Triage Notes (Signed)
Patient ambulatory to triage with complaints of SOB and right arm pain since 1600.  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  Pt reports taking robaxin at 1600 for right arm pain.  Pt denies cardiac hx

## 2019-09-27 NOTE — ED Provider Notes (Addendum)
[facility] Emergency Department Provider Note       Time seen: ----------------------------------------- 8:42 PM on 09/27/2019 -----------------------------------------   I have reviewed the triage vital signs and the nursing notes.  HISTORY   Chief Complaint Chest Pain and Shortness of Breath    HPI [name] is a 49 y.o. male with a history of avascular necrosis, pneumonia, hyperlipidemia who presents to the ED for shortness of breath and right arm pain since 4:00.  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  He has been taking Robaxin for right arm pain.  He denies any cardiac history.  He has also had some nausea and vomiting.  Past Medical History:  Diagnosis Date  . AVN (avascular necrosis of bone) ([place])    r femural head  . Complication of anesthesia    pt. states that he wakes up during procedures  . Pneumonia     Patient Active Problem List   Diagnosis Date Noted  . Bronchitis- mild  10/11/2018  . Cough- post nasal drip  10/11/2018  . Acute non-recurrent maxillary sinusitis 10/11/2018  . CAP (community acquired pneumonia) 10/02/2017  . AVN of femur ([place]) 02/17/2017  . Fracture of femoral neck, left ([place]) 02/14/2017  . Hyperlipemia, mixed 10/22/2015  . Adult ADHD 07/17/2015  . Kidney stones 07/17/2015  . 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 COLONOSCOPY N/A 04/01/2015   Procedure: COLONOSCOPY;  Surgeon: [name], MD;  Location: [place];  Service: Gastroenterology;  Laterality: N/A;  . GANGLION CYST EXCISION    . POLYPECTOMY  04/01/2015   Procedure: POLYPECTOMY;  Surgeon: [name], MD;  Location: [place];  Service: Gastroenterology;;  . TONGUE FLAP RELEASE    . TOTAL HIP ARTHROPLASTY  10/07/2011   Procedure: TOTAL HIP ARTHROPLASTY;  Surgeon: Kerin Salen, MD;  Location: [place];   Service: Orthopedics;  Laterality: Right;  . TOTAL HIP ARTHROPLASTY Left 02/17/2017   Procedure: TOTAL HIP ARTHROPLASTY ANTERIOR APPROACH;  Surgeon: Frederik Pear, MD;  Location: [place];  Service: Orthopedics;  Laterality: Left;    Allergies No known allergies  Social History Social History   Tobacco Use  . Smoking status: Never Smoker  . Smokeless tobacco: Never Used  Substance Use Topics  . Alcohol use: No  . Drug use: No   Review of Systems Constitutional: Positive for fever Cardiovascular: Negative for chest pain. Respiratory: Positive for shortness of breath Gastrointestinal: Negative for abdominal pain, positive for nausea and vomiting Musculoskeletal: Positive for arm pain Skin: Negative for rash. Neurological: Negative for headaches, focal weakness or numbness.  All systems negative/normal/unremarkable except as stated in the HPI  ____________________________________________   PHYSICAL EXAM:  VITAL SIGNS: ED Triage Vitals  Enc Vitals Group     BP 09/27/19 2031 (!) 162/91     Pulse Rate 09/27/19 2031 (!) 121     Resp 09/27/19 2031 (!) 22     Temp 09/27/19 2031 99.8 F (37.7 C)     Temp Source 09/27/19 2031 Oral     SpO2 09/27/19 2031 97 %     Weight 09/27/19 2032 275 lb (124.7 kg)     Height 09/27/19 2032 6\' 2"  (1.88 m)     Head Circumference --      Peak Flow --      Pain Score 09/27/19 2031 6  Pain Loc --      Pain Edu? --      Excl. in [place]? --    Constitutional: Alert and oriented. Well appearing and in no distress. Eyes: Conjunctivae are normal. Normal extraocular movements. Cardiovascular: Rapid rate, regular rhythm. No murmurs, rubs, or gallops. Respiratory: Normal respiratory effort without tachypnea nor retractions. Breath sounds are clear and equal bilaterally. No wheezes/rales/rhonchi. Gastrointestinal: Soft and nontender. Normal bowel sounds Musculoskeletal: Limited range of motion of the right shoulder with pain Neurologic:  Normal speech  and language. No gross focal neurologic deficits are appreciated.  Skin:  Skin is warm, dry and intact. No rash noted. Psychiatric: Mood and affect are normal. Speech and behavior are normal.  ____________________________________________  EKG: Interpreted by me.  Sinus tachycardia with a rate of 121 bpm, normal PR interval, normal QRS, normal QT  ____________________________________________  ED COURSE:  As part of my medical decision making, I reviewed the following data within the [place] History obtained from family if available, nursing notes, old chart and ekg, as well as notes from prior ED visits. Patient presented for dyspnea and possible COVID-19, we will assess with labs and imaging as indicated at this time.   Procedures  [name] was evaluated in Emergency Department on 09/27/2019 for the symptoms described in the history of present illness. He was evaluated in the context of the global COVID-19 pandemic, which necessitated consideration that the patient might be at risk for infection with the SARS-CoV-2 virus that causes COVID-19. Institutional protocols and algorithms that pertain to the evaluation of patients at risk for COVID-19 are in a state of rapid change based on information released by regulatory bodies including the CDC and federal and state organizations. These policies and algorithms were followed during the patient's care in the ED.  ____________________________________________   LABS (pertinent positives/negatives)  Labs Reviewed  CBC WITH DIFFERENTIAL/PLATELET - Abnormal; Notable for the following components:      Result Value   WBC 15.2 (*)    Neutro Abs 12.2 (*)    Monocytes Absolute 1.2 (*)    All other components within normal limits  COMPREHENSIVE METABOLIC PANEL - Abnormal; Notable for the following components:   Sodium 134 (*)    Glucose, Bld 159 (*)    Calcium 8.8 (*)    All other components within normal limits  RESPIRATORY  PANEL BY RT PCR (FLU A&B, COVID)  FIBRIN DERIVATIVES D-DIMER ([facility] ONLY)  POC SARS CORONAVIRUS 2 AG -  ED  TROPONIN I (HIGH SENSITIVITY)  TROPONIN I (HIGH SENSITIVITY)   RADIOLOGY Images were viewed by me  Chest x-ray IMPRESSION:  No active disease.  IMPRESSION:  1. Negative for acute pulmonary embolus.  2. Lung fields are clear  ____________________________________________   DIFFERENTIAL DIAGNOSIS   COVID-19, pneumonia, MI, CHF, PE  FINAL ASSESSMENT AND PLAN  Dyspnea, nonspecific chest pain, right shoulder pain   Plan: The patient had presented for COVID-19 symptoms. Patient's labs did indicate some leukocytosis. Patient's imaging was unremarkable.  Thus far his tests have been normal with the exception of the leukocytosis.  PCR was negative for COVID-19.  Patient be placed on azithromycin, no clear etiology for his symptoms at this time.  I advise close outpatient follow-up.   Laurence Aly, MD    Note: This note was generated in part or whole with voice recognition software. Voice recognition is usually quite accurate but there are transcription errors that can and very often do occur. I  apologize for any typographical errors that were not detected and corrected.     [name], MD 09/27/19 2248    [name], MD 09/27/19 [phone]

## 2019-09-27 NOTE — ED Notes (Signed)
Informed MD that pt's pain is coming back

## 2019-11-11 ENCOUNTER — Other Ambulatory Visit: Payer: Self-pay

## 2019-11-11 ENCOUNTER — Other Ambulatory Visit: Payer: Managed Care, Other (non HMO)

## 2019-11-11 DIAGNOSIS — R7309 Other abnormal glucose: Secondary | ICD-10-CM

## 2019-11-11 DIAGNOSIS — Z125 Encounter for screening for malignant neoplasm of prostate: Secondary | ICD-10-CM

## 2019-11-11 DIAGNOSIS — E782 Mixed hyperlipidemia: Secondary | ICD-10-CM

## 2019-11-11 DIAGNOSIS — I10 Essential (primary) hypertension: Secondary | ICD-10-CM

## 2019-11-11 DIAGNOSIS — Z79899 Other long term (current) drug therapy: Secondary | ICD-10-CM

## 2019-11-12 ENCOUNTER — Telehealth: Payer: Self-pay

## 2019-11-12 ENCOUNTER — Other Ambulatory Visit: Payer: Self-pay

## 2019-11-12 DIAGNOSIS — Z86010 Personal history of colonic polyps: Secondary | ICD-10-CM

## 2019-11-12 LAB — URINALYSIS, ROUTINE W REFLEX MICROSCOPIC
Bilirubin, UA: NEGATIVE
Glucose, UA: NEGATIVE
Ketones, UA: NEGATIVE
Leukocytes,UA: NEGATIVE
Nitrite, UA: NEGATIVE
Protein,UA: NEGATIVE
RBC, UA: NEGATIVE
Specific Gravity, UA: 1.018 (ref 1.005–1.030)
Urobilinogen, Ur: 0.2 mg/dL (ref 0.2–1.0)
pH, UA: 5.5 (ref 5.0–7.5)

## 2019-11-12 LAB — COMPREHENSIVE METABOLIC PANEL
ALT: 28 IU/L (ref 0–44)
AST: 20 IU/L (ref 0–40)
Albumin/Globulin Ratio: 2.3 — ABNORMAL HIGH (ref 1.2–2.2)
Albumin: 4.5 g/dL (ref 4.0–5.0)
Alkaline Phosphatase: 123 IU/L — ABNORMAL HIGH (ref 39–117)
BUN/Creatinine Ratio: 10 (ref 9–20)
BUN: 13 mg/dL (ref 6–24)
Bilirubin Total: 0.4 mg/dL (ref 0.0–1.2)
CO2: 26 mmol/L (ref 20–29)
Calcium: 9.3 mg/dL (ref 8.7–10.2)
Chloride: 103 mmol/L (ref 96–106)
Creatinine, Ser: 1.27 mg/dL (ref 0.76–1.27)
GFR calc Af Amer: 76 mL/min/{1.73_m2} (ref >59)
GFR calc non Af Amer: 65 mL/min/{1.73_m2} (ref >59)
Globulin, Total: 2 g/dL (ref 1.5–4.5)
Glucose: 102 mg/dL — ABNORMAL HIGH (ref 65–99)
Potassium: 4.3 mmol/L (ref 3.5–5.2)
Sodium: 141 mmol/L (ref 134–144)
Total Protein: 6.5 g/dL (ref 6.0–8.5)

## 2019-11-12 LAB — CBC WITH DIFFERENTIAL/PLATELET
Basophils Absolute: 0 10*3/uL (ref 0.0–0.2)
Basos: 1 %
EOS (ABSOLUTE): 0.2 10*3/uL (ref 0.0–0.4)
Eos: 2 %
Hematocrit: 46.2 % (ref 37.5–51.0)
Hemoglobin: 15.2 g/dL (ref 13.0–17.7)
Immature Grans (Abs): 0 10*3/uL (ref 0.0–0.1)
Immature Granulocytes: 0 %
Lymphocytes Absolute: 2.1 10*3/uL (ref 0.7–3.1)
Lymphs: 29 %
MCH: 29.2 pg (ref 26.6–33.0)
MCHC: 32.9 g/dL (ref 31.5–35.7)
MCV: 89 fL (ref 79–97)
Monocytes Absolute: 0.5 10*3/uL (ref 0.1–0.9)
Monocytes: 6 %
Neutrophils Absolute: 4.5 10*3/uL (ref 1.4–7.0)
Neutrophils: 62 %
Platelets: 315 10*3/uL (ref 150–450)
RBC: 5.2 x10E6/uL (ref 4.14–5.80)
RDW: 13.6 % (ref 11.6–15.4)
WBC: 7.3 10*3/uL (ref 3.4–10.8)

## 2019-11-12 LAB — PSA: Prostate Specific Ag, Serum: 0.3 ng/mL (ref 0.0–4.0)

## 2019-11-12 LAB — LIPID PANEL
Chol/HDL Ratio: 5.7 ratio — ABNORMAL HIGH (ref 0.0–5.0)
Cholesterol, Total: 199 mg/dL (ref 100–199)
HDL: 35 mg/dL — ABNORMAL LOW (ref >39)
LDL Chol Calc (NIH): 128 mg/dL — ABNORMAL HIGH (ref 0–99)
Triglycerides: 200 mg/dL — ABNORMAL HIGH (ref 0–149)
VLDL Cholesterol Cal: 36 mg/dL (ref 5–40)

## 2019-11-12 LAB — HGB A1C W/O EAG: Hgb A1c MFr Bld: 6.2 % — ABNORMAL HIGH (ref 4.8–5.6)

## 2019-11-12 LAB — TSH: TSH: 2.4 u[IU]/mL (ref 0.450–4.500)

## 2019-11-12 NOTE — Telephone Encounter (Signed)
Gastroenterology Pre-Procedure Review  Request Date: Monday 12/30/19 Requesting Physician: Dr. Allen [place]  PATIENT REVIEW QUESTIONS: The patient responded to the following health history questions as indicated:    1. Are you having any GI issues? no 2. Do you have a personal history of Polyps? yes (2016) 3. Do you have a family history of Colon Cancer or Polyps? yes (sister colon cancer) 4. Diabetes Mellitus? no 5. Joint replacements in the past 12 months?no 6. Major health problems in the past 3 months?no 7. Any artificial heart valves, MVP, or defibrillator?no    MEDICATIONS & ALLERGIES:    Patient reports the following regarding taking any anticoagulation/antiplatelet therapy:   Plavix, Coumadin, Eliquis, Xarelto, Lovenox, Pradaxa, Brilinta, or Effient? no Aspirin? no  Patient confirms/reports the following medications:  Current Outpatient Medications  Medication Sig Dispense Refill  . amphetamine-dextroamphetamine (ADDERALL XR) 25 MG 24 hr capsule Take 25 mg by mouth daily.    [name] azithromycin (ZITHROMAX Z-PAK) 250 MG tablet Take 2 tablets (500 mg) on  Day 1,  followed by 1 tablet (250 mg) once daily on Days 2 through 5. 6 each 0  . benazepril (LOTENSIN) 20 MG tablet Take 20 mg by mouth daily.     . budesonide (PULMICORT FLEXHALER) 180 MCG/ACT inhaler Inhale into the lungs.    . cefUROXime (CEFTIN) 500 MG tablet TK 1 T PO Q 12 H FOR 10 DAYS    . cyclobenzaprine (FLEXERIL) 10 MG tablet Take 1 tablet (10 mg total) by mouth 3 (three) times daily. 20 tablet 0  . fluticasone (FLONASE) 50 MCG/ACT nasal spray Place 1-2 sprays into the nose daily.     . fluticasone (FLOVENT HFA) 110 MCG/ACT inhaler Inhale into the lungs.    [name] ketorolac (TORADOL) 10 MG tablet Take 10 mg by mouth every 6 (six) hours.     . meloxicam (MOBIC) 7.5 MG tablet Take 7.5 mg by mouth daily as needed.    . montelukast (SINGULAIR) 10 MG tablet Take 10 mg by mouth at bedtime.     [name] omeprazole (PRILOSEC) 20 MG capsule Take 20  mg by mouth 2 (two) times daily before a meal.     . ondansetron (ZOFRAN) 4 MG tablet Take 4 mg by mouth every 8 (eight) hours as needed.     [name] oxyCODONE-acetaminophen (PERCOCET) 5-325 MG tablet Take 1 tablet by mouth every 8 (eight) hours as needed. 12 tablet 0  . tamsulosin (FLOMAX) 0.4 MG CAPS capsule Take 0.4 mg by mouth daily.     [name] tiotropium (SPIRIVA) 18 MCG inhalation capsule Place 18 mcg into inhaler and inhale daily.     . traZODone (DESYREL) 150 MG tablet TK 1 OR 2 TS PO QHS     No current facility-administered medications for this visit.    Patient confirms/reports the following allergies:  Allergies  Allergen Reactions  . No Known Allergies     No orders of the defined types were placed in this encounter.   AUTHORIZATION INFORMATION Primary Insurance: 1D#: Group #:  Secondary Insurance: 1D#: Group #:  SCHEDULE INFORMATION: Date: Monday 12/30/19 Time: Location:[facility]

## 2019-12-18 ENCOUNTER — Encounter: Payer: Self-pay | Admitting: Gastroenterology

## 2019-12-26 ENCOUNTER — Other Ambulatory Visit
Admission: RE | Admit: 2019-12-26 | Discharge: 2019-12-26 | Disposition: A | Discharge status: Home / Self Care | Payer: Managed Care, Other (non HMO) | Source: Ambulatory Visit | Attending: Gastroenterology | Admitting: Gastroenterology

## 2019-12-26 DIAGNOSIS — Z01812 Encounter for preprocedural laboratory examination: Secondary | ICD-10-CM | POA: Insufficient documentation

## 2019-12-26 DIAGNOSIS — Z20822 Contact with and (suspected) exposure to covid-19: Secondary | ICD-10-CM | POA: Insufficient documentation

## 2019-12-26 LAB — SARS CORONAVIRUS 2 (TAT 6-24 HRS): SARS Coronavirus 2: NEGATIVE

## 2019-12-27 NOTE — Discharge Instructions (Signed)
General Anesthesia, Adult, Care After This sheet gives you information about how to care for yourself after your procedure. Your health care provider may also give you more specific instructions. If you have problems or questions, contact your health care provider. What can I expect after the procedure? After the procedure, the following side effects are common:  Pain or discomfort at the IV site.  Nausea.  Vomiting.  Sore throat.  Trouble concentrating.  Feeling cold or chills.  Weak or tired.  Sleepiness and fatigue.  Soreness and body aches. These side effects can affect parts of the body that were not involved in surgery. Follow these instructions at home:  For at least 24 hours after the procedure:  Have a responsible adult stay with you. It is important to have someone help care for you until you are awake and alert.  Rest as needed.  Do not: ? Participate in activities in which you could fall or become injured. ? Drive. ? Use heavy machinery. ? Drink alcohol. ? Take sleeping pills or medicines that cause drowsiness. ? Make important decisions or sign legal documents. ? Take care of children on your own. Eating and drinking  Follow any instructions from your health care provider about eating or drinking restrictions.  When you feel hungry, start by eating small amounts of foods that are soft and easy to digest (bland), such as toast. Gradually return to your regular diet.  Drink enough fluid to keep your urine pale yellow.  If you vomit, rehydrate by drinking water, juice, or clear broth. General instructions  If you have sleep apnea, surgery and certain medicines can increase your risk for breathing problems. Follow instructions from your health care provider about wearing your sleep device: ? Anytime you are sleeping, including during daytime naps. ? While taking prescription pain medicines, sleeping medicines, or medicines that make you drowsy.  Return to  your normal activities as told by your health care provider. Ask your health care provider what activities are safe for you.  Take over-the-counter and prescription medicines only as told by your health care provider.  If you smoke, do not smoke without supervision.  Keep all follow-up visits as told by your health care provider. This is important. Contact a health care provider if:  You have nausea or vomiting that does not get better with medicine.  You cannot eat or drink without vomiting.  You have pain that does not get better with medicine.  You are unable to pass urine.  You develop a skin rash.  You have a fever.  You have redness around your IV site that gets worse. Get help right away if:  You have difficulty breathing.  You have chest pain.  You have blood in your urine or stool, or you vomit blood. Summary  After the procedure, it is common to have a sore throat or nausea. It is also common to feel tired.  Have a responsible adult stay with you for the first 24 hours after general anesthesia. It is important to have someone help care for you until you are awake and alert.  When you feel hungry, start by eating small amounts of foods that are soft and easy to digest (bland), such as toast. Gradually return to your regular diet.  Drink enough fluid to keep your urine pale yellow.  Return to your normal activities as told by your health care provider. Ask your health care provider what activities are safe for you. This information is not   intended to replace advice given to you by your health care provider. Make sure you discuss any questions you have with your health care provider. Document Revised: 09/08/2017 Document Reviewed: 04/21/2017 Elsevier Patient Education  2020 [employer]  

## 2019-12-30 ENCOUNTER — Other Ambulatory Visit: Payer: Self-pay

## 2019-12-30 ENCOUNTER — Ambulatory Visit
Admission: RE | Admit: 2019-12-30 | Discharge: 2019-12-30 | Disposition: A | Discharge status: Home / Self Care | Payer: Managed Care, Other (non HMO) | Attending: Gastroenterology | Admitting: Gastroenterology

## 2019-12-30 ENCOUNTER — Encounter: Payer: Self-pay | Admitting: Gastroenterology

## 2019-12-30 ENCOUNTER — Encounter
Admission: RE | Disposition: A | Discharge status: Home / Self Care | Payer: Self-pay | Source: Home / Self Care | Attending: Gastroenterology

## 2019-12-30 ENCOUNTER — Ambulatory Visit: Payer: Managed Care, Other (non HMO) | Admitting: Anesthesiology

## 2019-12-30 DIAGNOSIS — I10 Essential (primary) hypertension: Secondary | ICD-10-CM | POA: Insufficient documentation

## 2019-12-30 DIAGNOSIS — K640 First degree hemorrhoids: Secondary | ICD-10-CM | POA: Diagnosis not present

## 2019-12-30 DIAGNOSIS — D123 Benign neoplasm of transverse colon: Secondary | ICD-10-CM | POA: Diagnosis not present

## 2019-12-30 DIAGNOSIS — Z1211 Encounter for screening for malignant neoplasm of colon: Secondary | ICD-10-CM | POA: Insufficient documentation

## 2019-12-30 DIAGNOSIS — Z860100 Personal history of colon polyps, unspecified: Secondary | ICD-10-CM

## 2019-12-30 DIAGNOSIS — Z79899 Other long term (current) drug therapy: Secondary | ICD-10-CM | POA: Insufficient documentation

## 2019-12-30 DIAGNOSIS — F909 Attention-deficit hyperactivity disorder, unspecified type: Secondary | ICD-10-CM | POA: Insufficient documentation

## 2019-12-30 DIAGNOSIS — K621 Rectal polyp: Secondary | ICD-10-CM

## 2019-12-30 DIAGNOSIS — K635 Polyp of colon: Secondary | ICD-10-CM | POA: Diagnosis not present

## 2019-12-30 HISTORY — PX: COLONOSCOPY WITH PROPOFOL: SHX5780

## 2019-12-30 HISTORY — PX: POLYPECTOMY: SHX5525

## 2019-12-30 HISTORY — DX: Essential (primary) hypertension: I10

## 2019-12-30 HISTORY — DX: Attention-deficit hyperactivity disorder, unspecified type: F90.9

## 2019-12-30 HISTORY — DX: Allergy, unspecified, initial encounter: T78.40XA

## 2019-12-30 SURGERY — COLONOSCOPY WITH PROPOFOL
Anesthesia: General | Site: Rectum

## 2019-12-30 MED ORDER — LACTATED RINGERS IV SOLN
10.0000 mL/h | INTRAVENOUS | Status: DC
  Administered 2019-12-30: 10 mL/h via INTRAVENOUS

## 2019-12-30 MED ORDER — PROPOFOL 10 MG/ML IV BOLUS
INTRAVENOUS | Status: DC | PRN
  Administered 2019-12-30: 20 mg via INTRAVENOUS
  Administered 2019-12-30: 30 mg via INTRAVENOUS
  Administered 2019-12-30: 10 mg via INTRAVENOUS
  Administered 2019-12-30 (×3): 20 mg via INTRAVENOUS
  Administered 2019-12-30: 80 mg via INTRAVENOUS

## 2019-12-30 MED ORDER — STERILE WATER FOR IRRIGATION IR SOLN
Status: DC | PRN
  Administered 2019-12-30: 50 mL

## 2019-12-30 MED ORDER — ACETAMINOPHEN 160 MG/5ML PO SOLN: 325.0000 mg | Freq: Once | ORAL | Status: DC

## 2019-12-30 MED ORDER — ACETAMINOPHEN 325 MG PO TABS: 325.0000 mg | ORAL_TABLET | Freq: Once | ORAL | Status: DC

## 2019-12-30 MED ORDER — LIDOCAINE HCL (CARDIAC) PF 100 MG/5ML IV SOSY
PREFILLED_SYRINGE | INTRAVENOUS | Status: DC | PRN
  Administered 2019-12-30: 40 mg via INTRAVENOUS

## 2019-12-30 SURGICAL SUPPLY — 7 items
CANISTER SUCT 1200ML W/VALVE (MISCELLANEOUS) ×3 IMPLANT
GOWN CVR UNV OPN BCK APRN NK (MISCELLANEOUS) ×2 IMPLANT
GOWN ISOL THUMB LOOP REG UNIV (MISCELLANEOUS) ×4
KIT ENDO PROCEDURE OLY (KITS) ×3 IMPLANT
SNARE SHORT THROW 13M SML OVAL (MISCELLANEOUS) ×2 IMPLANT
TRAP ETRAP POLY (MISCELLANEOUS) ×2 IMPLANT
WATER STERILE IRR 250ML POUR (IV SOLUTION) ×3 IMPLANT

## 2019-12-30 NOTE — Anesthesia Procedure Notes (Signed)
Performed by: Maalle Starrett, CRNA Pre-anesthesia Checklist: Patient identified, Emergency Drugs available, Suction available, Timeout performed and Patient being monitored Patient Re-evaluated:Patient Re-evaluated prior to induction Oxygen Delivery Method: Nasal cannula Placement Confirmation: positive ETCO2       

## 2019-12-30 NOTE — Anesthesia Preprocedure Evaluation (Signed)
Anesthesia Evaluation  Patient identified by MRN, date of birth, ID band Patient awake    Reviewed: Allergy & Precautions, H&P , NPO status , Patient's Chart, lab work & pertinent test results  Airway Mallampati: II  TM Distance: >3 FB Neck ROM: full    Dental no notable dental hx.    Pulmonary    Pulmonary exam normal breath sounds clear to auscultation       Cardiovascular hypertension, Normal cardiovascular exam Rhythm:regular Rate:Normal     Neuro/Psych PSYCHIATRIC DISORDERS    GI/Hepatic   Endo/Other    Renal/GU      Musculoskeletal   Abdominal   Peds  Hematology   Anesthesia Other Findings   Reproductive/Obstetrics                             Anesthesia Physical Anesthesia Plan  ASA: II  Anesthesia Plan: General   Post-op Pain Management:    Induction: Intravenous  PONV Risk Score and Plan: 2 and Treatment may vary due to age or medical condition, Propofol infusion and TIVA  Airway Management Planned: Natural Airway  Additional Equipment:   Intra-op Plan:   Post-operative Plan:   Informed Consent: I have reviewed the patients History and Physical, chart, labs and discussed the procedure including the risks, benefits and alternatives for the proposed anesthesia with the patient or authorized representative who has indicated his/her understanding and acceptance.     Dental Advisory Given  Plan Discussed with: CRNA  Anesthesia Plan Comments:         Anesthesia Quick Evaluation

## 2019-12-30 NOTE — H&P (Signed)
Kenneth Lame, MD [name]., [name], [name] [address] Phone:[phone] Fax : [phone]  Primary Care Physician:  Kenneth Crouch, MD Primary Gastroenterologist:  Dr. Allen Wallace  Pre-Procedure History & Physical: HPI:  [name] is a 49 y.o. male is here for an colonoscopy.   Past Medical History:  Diagnosis Date  . ADHD   . Allergies   . AVN (avascular necrosis of bone) (HCC)    r femural head  . Complication of anesthesia    pt. states that he wakes up during procedures  . Hypertension   . Pneumonia     Past Surgical History:  Procedure Laterality Date  . COLONOSCOPY N/A 04/01/2015   Procedure: COLONOSCOPY;  Surgeon: Kenneth Lame, MD;  Location: [place];  Service: Gastroenterology;  Laterality: N/A;  . GANGLION CYST EXCISION    . POLYPECTOMY  04/01/2015   Procedure: POLYPECTOMY;  Surgeon: Kenneth Lame, MD;  Location: [place];  Service: Gastroenterology;;  . TONGUE FLAP RELEASE    . TOTAL HIP ARTHROPLASTY  10/07/2011   Procedure: TOTAL HIP ARTHROPLASTY;  Surgeon: Kenneth Salen, MD;  Location: [place];  Service: Orthopedics;  Laterality: Right;  . TOTAL HIP ARTHROPLASTY Left 02/17/2017   Procedure: TOTAL HIP ARTHROPLASTY ANTERIOR APPROACH;  Surgeon: Kenneth Pear, MD;  Location: [place];  Service: Orthopedics;  Laterality: Left;    Prior to Admission medications   Medication Sig Start Date End Date Taking? Authorizing Provider  benazepril-hydrochlorthiazide (LOTENSIN HCT) 20-12.5 MG tablet Take 1 tablet by mouth daily.   Yes [provider]  ondansetron (ZOFRAN) 4 MG tablet Take 4 mg by mouth every 8 (eight) hours as needed.  09/24/18  Yes [provider]  amphetamine-dextroamphetamine (ADDERALL XR) 25 MG 24 hr capsule Take 25 mg by mouth daily. 04/10/19   [provider]  azithromycin (ZITHROMAX Z-PAK) 250 MG tablet Take 2 tablets (500 mg) on  Day 1,  followed by 1 tablet (250 mg) once daily on Days 2 through  5. Patient not taking: Reported on 12/18/2019 09/27/19   Kenneth Newport, MD  benazepril (LOTENSIN) 20 MG tablet Take 20 mg by mouth daily.  06/22/18 06/22/19  [provider]  budesonide (PULMICORT FLEXHALER) 180 MCG/ACT inhaler Inhale into the lungs as needed.  10/12/17 10/12/18  [provider]  cefUROXime (CEFTIN) 500 MG tablet TK 1 T PO Q 12 H FOR 10 DAYS 12/26/18   [provider]  cyclobenzaprine (FLEXERIL) 10 MG tablet Take 1 tablet (10 mg total) by mouth 3 (three) times daily. Patient taking differently: Take 10 mg by mouth as needed.  06/10/19   Merlyn Lot, MD  fluticasone [facility]) 50 MCG/ACT nasal spray Place 1-2 sprays into the nose as needed.  08/08/18   [provider]  fluticasone (FLOVENT HFA) 110 MCG/ACT inhaler Inhale into the lungs as needed.  10/19/18 10/19/19  [provider]  ketorolac (TORADOL) 10 MG tablet Take 10 mg by mouth as needed.  08/08/18   [provider]  meloxicam (MOBIC) 7.5 MG tablet Take 7.5 mg by mouth daily as needed.    [provider]  montelukast (SINGULAIR) 10 MG tablet Take 10 mg by mouth as needed.  11/02/17 11/02/18  [provider]  omeprazole (PRILOSEC) 20 MG capsule Take 20 mg by mouth as needed.  04/10/18   [provider]  oxyCODONE-acetaminophen (PERCOCET) 5-325 MG tablet Take 1 tablet by mouth every 8 (eight) hours as needed. Patient not taking: Reported  on 12/18/2019 09/27/19   Kenneth Newport, MD  tamsulosin (FLOMAX) 0.4 MG CAPS capsule Take 0.4 mg by mouth as needed.  08/08/18   [provider]  tiotropium (SPIRIVA) 18 MCG inhalation capsule Place 18 mcg into inhaler and inhale daily.  11/02/17 11/02/18  [provider]  traZODone (DESYREL) 150 MG tablet 300 mg at bedtime.  12/17/18   [provider]    Allergies as of 11/12/2019 - Review Complete 09/27/2019  Allergen Reaction Noted  . No known allergies  02/16/2017    Family  History  Problem Relation Age of Onset  . Cancer Mother   . Cancer Sister   . Colon cancer Sister     Social History   Socioeconomic History  . Marital status: Single    Spouse name: Not on file  . Number of children: Not on file  . Years of education: Not on file  . Highest education level: Not on file  Occupational History  . Not on file  Tobacco Use  . Smoking status: Never Smoker  . Smokeless tobacco: Never Used  Substance and Sexual Activity  . Alcohol use: No  . Drug use: No  . Sexual activity: Yes  Other Topics Concern  . Not on file  Social History Narrative  . Not on file   Social Determinants of Health   Financial Resource Strain:   . Difficulty of Paying Living Expenses:   Food Insecurity:   . Worried About Charity fundraiser in the Last Year:   . Arboriculturist in the Last Year:   Transportation Needs:   . Film/video editor (Medical):   [name] Lack of Transportation (Non-Medical):   Physical Activity:   . Days of Exercise per Week:   . Minutes of Exercise per Session:   Stress:   . Feeling of Stress :   Social Connections:   . Frequency of Communication with Friends and Family:   . Frequency of Social Gatherings with Friends and Family:   . Attends Religious Services:   . Active Member of Clubs or Organizations:   . Attends Archivist Meetings:   [name] Marital Status:   Intimate Partner Violence:   . Fear of Current or Ex-Partner:   . Emotionally Abused:   [name] Physically Abused:   . Sexually Abused:     Review of Systems: See HPI, otherwise negative ROS  Physical Exam: BP (!) 156/100   Pulse 76   Temp 98.1 F (36.7 C) (Temporal)   Resp 16   Ht 6\' 2"  (1.88 m)   Wt 123.8 kg   SpO2 100%   BMI 35.05 kg/m  General:   Alert,  pleasant and cooperative in NAD Head:  Normocephalic and atraumatic. Neck:  Supple; no masses or thyromegaly. Lungs:  Clear throughout to auscultation.    Heart:  Regular rate and rhythm. Abdomen:  Soft,  nontender and nondistended. Normal bowel sounds, without guarding, and without rebound.   Neurologic:  Alert and  oriented x4;  grossly normal neurologically.  Impression/Plan: PJ BARTSCHI is here for an colonoscopy to be performed for adenomatous polyps 03/2015  Risks, benefits, limitations, and alternatives regarding  colonoscopy have been reviewed with the patient.  Questions have been answered.  All parties agreeable.   Kenneth Lame, MD  12/30/2019, 11:27 AM

## 2019-12-30 NOTE — Transfer of Care (Signed)
Immediate Anesthesia Transfer of Care Note  Patient: [name]  Procedure(s) Performed: COLONOSCOPY WITH BIOPSY (N/A Rectum) POLYPECTOMY (N/A Rectum)  Patient Location: PACU  Anesthesia Type: General  Level of Consciousness: awake, alert  and patient cooperative  Airway and Oxygen Therapy: Patient Spontanous Breathing and Patient connected to supplemental oxygen  Post-op Assessment: Post-op Vital signs reviewed, Patient's Cardiovascular Status Stable, Respiratory Function Stable, Patent Airway and No signs of Nausea or vomiting  Post-op Vital Signs: Reviewed and stable  Complications: No apparent anesthesia complications

## 2019-12-30 NOTE — Op Note (Signed)
[facility] Gastroenterology Patient Name: [name] Procedure Date: 12/30/2019 11:47 AM MRN: [id number] Account #: [id number] Date of Birth: [birth year] Admit Type: Outpatient Age: 49 Room: [facility] OR ROOM 01 Gender: Male Note Status: Finalized Procedure:             Colonoscopy Indications:           High risk colon cancer surveillance: Personal history                         of colonic polyps Providers:             [name] MD, MD Referring MD:          Leonie Douglas. Doy Hutching, MD (Referring MD) Medicines:             Propofol per Anesthesia Complications:         No immediate complications. Procedure:             Pre-Anesthesia Assessment:                        - Prior to the procedure, a History and Physical was                         performed, and patient medications and allergies were                         reviewed. The patient's tolerance of previous                         anesthesia was also reviewed. The risks and benefits                         of the procedure and the sedation options and risks                         were discussed with the patient. All questions were                         answered, and informed consent was obtained. Prior                         Anticoagulants: The patient has taken no previous                         anticoagulant or antiplatelet agents. ASA Grade                         Assessment: II - A patient with mild systemic disease.                         After reviewing the risks and benefits, the patient                         was deemed in satisfactory condition to undergo the                         procedure.  After obtaining informed consent, the colonoscope was                         passed under direct vision. Throughout the procedure,                         the patient's blood pressure, pulse, and oxygen                         saturations were monitored continuously. The was                   introduced through the anus and advanced to the the                         cecum, identified by appendiceal orifice and ileocecal                         valve. The colonoscopy was performed without                         difficulty. The patient tolerated the procedure well.                         The quality of the bowel preparation was good. Findings:      The perianal and digital rectal examinations were normal.      A 4 mm polyp was found in the rectum. The polyp was sessile. The polyp       was removed with a cold snare. Resection and retrieval were complete.      A 4 mm polyp was found in the transverse colon. The polyp was sessile.       The polyp was removed with a cold snare. Resection and retrieval were       complete.      Non-bleeding internal hemorrhoids were found during retroflexion. The       hemorrhoids were Grade I (internal hemorrhoids that do not prolapse). Impression:            - One 4 mm polyp in the rectum, removed with a cold                         snare. Resected and retrieved.                        - One 4 mm polyp in the transverse colon, removed with                         a cold snare. Resected and retrieved.                        - Non-bleeding internal hemorrhoids. Recommendation:        - Discharge patient to home.                        - Resume previous diet.                        - Continue present medications.                        -  Await pathology results.                        - Repeat colonoscopy in 5 years for surveillance. Procedure Code(s):     --- Professional ---                        [phone], Colonoscopy, flexible; with removal of                         tumor(s), polyp(s), or other lesion(s) by snare                         technique Diagnosis Code(s):     --- Professional ---                        Z86.010, Personal history of colonic polyps                        K62.1, Rectal polyp                        K63.5,  Polyp of colon CPT copyright 2019 [employer]. All rights reserved. The codes documented in this report are preliminary and upon coder review may  be revised to meet current compliance requirements. [name] MD, MD 12/30/2019 12:11:27 PM This report has been signed electronically. Number of Addenda: 0 Note Initiated On: 12/30/2019 11:47 AM Scope Withdrawal Time: 0 hours 6 minutes 36 seconds  Total Procedure Duration: 0 hours 11 minutes 2 seconds  Estimated Blood Loss:  Estimated blood loss: none.      [facility]

## 2019-12-30 NOTE — Anesthesia Postprocedure Evaluation (Signed)
Anesthesia Post Note  Patient: [name]  Procedure(s) Performed: COLONOSCOPY WITH BIOPSY (N/A Rectum) POLYPECTOMY (N/A Rectum)     Patient location during evaluation: PACU Anesthesia Type: General Level of consciousness: awake and alert and oriented Pain management: satisfactory to patient Vital Signs Assessment: post-procedure vital signs reviewed and stable Respiratory status: spontaneous breathing, nonlabored ventilation and respiratory function stable Cardiovascular status: blood pressure returned to baseline and stable Postop Assessment: Adequate PO intake and No signs of nausea or vomiting Anesthetic complications: no    Raliegh Ip

## 2019-12-31 ENCOUNTER — Encounter: Payer: Self-pay | Admitting: *Deleted

## 2020-01-01 ENCOUNTER — Encounter: Payer: Self-pay | Admitting: Gastroenterology

## 2020-01-01 LAB — SURGICAL PATHOLOGY

## 2020-03-27 ENCOUNTER — Other Ambulatory Visit
Admission: RE | Admit: 2020-03-27 | Discharge: 2020-03-27 | Disposition: A | Discharge status: Home / Self Care | Payer: Managed Care, Other (non HMO) | Source: Ambulatory Visit | Attending: Pediatrics | Admitting: Pediatrics

## 2020-03-27 DIAGNOSIS — R0602 Shortness of breath: Secondary | ICD-10-CM | POA: Insufficient documentation

## 2020-03-27 DIAGNOSIS — R05 Cough: Secondary | ICD-10-CM | POA: Diagnosis not present

## 2020-03-27 LAB — FIBRIN DERIVATIVES D-DIMER (ARMC ONLY): Fibrin derivatives D-dimer (ARMC): 214.6 ng/mL (FEU) (ref 0.00–499.00)

## 2020-03-27 LAB — BRAIN NATRIURETIC PEPTIDE: B Natriuretic Peptide: 23.4 pg/mL (ref 0.0–100.0)

## 2020-07-09 ENCOUNTER — Other Ambulatory Visit: Payer: Managed Care, Other (non HMO)

## 2020-07-09 ENCOUNTER — Other Ambulatory Visit: Payer: Self-pay

## 2020-07-09 DIAGNOSIS — Z79899 Other long term (current) drug therapy: Secondary | ICD-10-CM | POA: Diagnosis not present

## 2020-07-09 DIAGNOSIS — E782 Mixed hyperlipidemia: Secondary | ICD-10-CM

## 2020-07-09 DIAGNOSIS — R7309 Other abnormal glucose: Secondary | ICD-10-CM

## 2020-07-09 DIAGNOSIS — I10 Essential (primary) hypertension: Secondary | ICD-10-CM

## 2020-07-10 LAB — COMPREHENSIVE METABOLIC PANEL
ALT: 34 IU/L (ref 0–44)
AST: 17 IU/L (ref 0–40)
Albumin/Globulin Ratio: 1.6 (ref 1.2–2.2)
Albumin: 4.4 g/dL (ref 4.0–5.0)
Alkaline Phosphatase: 129 IU/L — ABNORMAL HIGH (ref 44–121)
BUN/Creatinine Ratio: 11 (ref 9–20)
BUN: 12 mg/dL (ref 6–24)
Bilirubin Total: 0.3 mg/dL (ref 0.0–1.2)
CO2: 25 mmol/L (ref 20–29)
Calcium: 9.5 mg/dL (ref 8.7–10.2)
Chloride: 102 mmol/L (ref 96–106)
Creatinine, Ser: 1.13 mg/dL (ref 0.76–1.27)
GFR calc Af Amer: 87 mL/min/{1.73_m2} (ref >59)
GFR calc non Af Amer: 75 mL/min/{1.73_m2} (ref >59)
Globulin, Total: 2.7 g/dL (ref 1.5–4.5)
Glucose: 117 mg/dL — ABNORMAL HIGH (ref 65–99)
Potassium: 4.4 mmol/L (ref 3.5–5.2)
Sodium: 141 mmol/L (ref 134–144)
Total Protein: 7.1 g/dL (ref 6.0–8.5)

## 2020-07-10 LAB — CBC WITH DIFFERENTIAL/PLATELET
Basophils Absolute: 0 10*3/uL (ref 0.0–0.2)
Basos: 0 %
EOS (ABSOLUTE): 0.1 10*3/uL (ref 0.0–0.4)
Eos: 1 %
Hematocrit: 47.2 % (ref 37.5–51.0)
Hemoglobin: 15.5 g/dL (ref 13.0–17.7)
Immature Grans (Abs): 0 10*3/uL (ref 0.0–0.1)
Immature Granulocytes: 0 %
Lymphocytes Absolute: 2.1 10*3/uL (ref 0.7–3.1)
Lymphs: 29 %
MCH: 28.9 pg (ref 26.6–33.0)
MCHC: 32.8 g/dL (ref 31.5–35.7)
MCV: 88 fL (ref 79–97)
Monocytes Absolute: 0.4 10*3/uL (ref 0.1–0.9)
Monocytes: 6 %
Neutrophils Absolute: 4.7 10*3/uL (ref 1.4–7.0)
Neutrophils: 64 %
Platelets: 325 10*3/uL (ref 150–450)
RBC: 5.37 x10E6/uL (ref 4.14–5.80)
RDW: 13.2 % (ref 11.6–15.4)
WBC: 7.4 10*3/uL (ref 3.4–10.8)

## 2020-07-10 LAB — URINALYSIS, ROUTINE W REFLEX MICROSCOPIC
Bilirubin, UA: NEGATIVE
Glucose, UA: NEGATIVE
Ketones, UA: NEGATIVE
Leukocytes,UA: NEGATIVE
Nitrite, UA: NEGATIVE
Protein,UA: NEGATIVE
RBC, UA: NEGATIVE
Specific Gravity, UA: 1.019 (ref 1.005–1.030)
Urobilinogen, Ur: 0.2 mg/dL (ref 0.2–1.0)
pH, UA: 5.5 (ref 5.0–7.5)

## 2020-07-10 LAB — HGB A1C W/O EAG: Hgb A1c MFr Bld: 6.3 % — ABNORMAL HIGH (ref 4.8–5.6)

## 2020-07-10 LAB — LIPID PANEL
Chol/HDL Ratio: 5.8 ratio — ABNORMAL HIGH (ref 0.0–5.0)
Cholesterol, Total: 221 mg/dL — ABNORMAL HIGH (ref 100–199)
HDL: 38 mg/dL — ABNORMAL LOW (ref >39)
LDL Chol Calc (NIH): 149 mg/dL — ABNORMAL HIGH (ref 0–99)
Triglycerides: 188 mg/dL — ABNORMAL HIGH (ref 0–149)
VLDL Cholesterol Cal: 34 mg/dL (ref 5–40)

## 2020-07-21 ENCOUNTER — Other Ambulatory Visit: Payer: Self-pay

## 2020-07-21 ENCOUNTER — Observation Stay
Admit: 2020-07-21 | Discharge: 2020-07-21 | Disposition: A | Discharge status: Home / Self Care | Payer: Managed Care, Other (non HMO) | Attending: Internal Medicine | Admitting: Internal Medicine

## 2020-07-21 ENCOUNTER — Observation Stay
Admission: EM | Admit: 2020-07-21 | Discharge: 2020-07-22 | Disposition: A | Discharge status: Home / Self Care | Payer: Managed Care, Other (non HMO) | Attending: Internal Medicine | Admitting: Internal Medicine

## 2020-07-21 ENCOUNTER — Emergency Department: Payer: Managed Care, Other (non HMO)

## 2020-07-21 DIAGNOSIS — Z20822 Contact with and (suspected) exposure to covid-19: Secondary | ICD-10-CM | POA: Diagnosis not present

## 2020-07-21 DIAGNOSIS — Z86010 Personal history of colonic polyps: Secondary | ICD-10-CM | POA: Insufficient documentation

## 2020-07-21 DIAGNOSIS — Z79899 Other long term (current) drug therapy: Secondary | ICD-10-CM | POA: Insufficient documentation

## 2020-07-21 DIAGNOSIS — I10 Essential (primary) hypertension: Secondary | ICD-10-CM | POA: Diagnosis not present

## 2020-07-21 DIAGNOSIS — F909 Attention-deficit hyperactivity disorder, unspecified type: Secondary | ICD-10-CM

## 2020-07-21 DIAGNOSIS — R079 Chest pain, unspecified: Principal | ICD-10-CM

## 2020-07-21 DIAGNOSIS — Z96642 Presence of left artificial hip joint: Secondary | ICD-10-CM | POA: Diagnosis not present

## 2020-07-21 DIAGNOSIS — I200 Unstable angina: Secondary | ICD-10-CM | POA: Diagnosis present

## 2020-07-21 LAB — CBC WITH DIFFERENTIAL/PLATELET
Abs Immature Granulocytes: 0.03 10*3/uL (ref 0.00–0.07)
Basophils Absolute: 0 10*3/uL (ref 0.0–0.1)
Basophils Relative: 0 %
Eosinophils Absolute: 0.2 10*3/uL (ref 0.0–0.5)
Eosinophils Relative: 3 %
HCT: 45.6 % (ref 39.0–52.0)
Hemoglobin: 15.2 g/dL (ref 13.0–17.0)
Immature Granulocytes: 0 %
Lymphocytes Relative: 33 %
Lymphs Abs: 2.4 10*3/uL (ref 0.7–4.0)
MCH: 29.5 pg (ref 26.0–34.0)
MCHC: 33.3 g/dL (ref 30.0–36.0)
MCV: 88.5 fL (ref 80.0–100.0)
Monocytes Absolute: 0.5 10*3/uL (ref 0.1–1.0)
Monocytes Relative: 7 %
Neutro Abs: 4 10*3/uL (ref 1.7–7.7)
Neutrophils Relative %: 57 %
Platelets: 318 10*3/uL (ref 150–400)
RBC: 5.15 MIL/uL (ref 4.22–5.81)
RDW: 13.1 % (ref 11.5–15.5)
WBC: 7 10*3/uL (ref 4.0–10.5)
nRBC: 0 % (ref 0.0–0.2)

## 2020-07-21 LAB — ECHOCARDIOGRAM COMPLETE
AR max vel: 2.18 cm2
AV Area VTI: 2.18 cm2
AV Area mean vel: 2.38 cm2
AV Mean grad: 3.5 mmHg
AV Peak grad: 6.2 mmHg
Ao pk vel: 1.24 m/s
Area-P 1/2: 4.68 cm2
Height: 74 in
S' Lateral: 3.13 cm
Weight: 4448 oz

## 2020-07-21 LAB — TROPONIN I (HIGH SENSITIVITY)
Troponin I (High Sensitivity): 5 ng/L (ref <18)
Troponin I (High Sensitivity): 4 ng/L (ref <18)
Troponin I (High Sensitivity): 4 ng/L (ref <18)
Troponin I (High Sensitivity): 4 ng/L (ref <18)

## 2020-07-21 LAB — RESPIRATORY PANEL BY RT PCR (FLU A&B, COVID)
Influenza A by PCR: NEGATIVE
Influenza B by PCR: NEGATIVE
SARS Coronavirus 2 by RT PCR: NEGATIVE

## 2020-07-21 LAB — COMPREHENSIVE METABOLIC PANEL
ALT: 37 U/L (ref 0–44)
AST: 25 U/L (ref 15–41)
Albumin: 4.1 g/dL (ref 3.5–5.0)
Alkaline Phosphatase: 99 U/L (ref 38–126)
Anion gap: 10 (ref 5–15)
BUN: 15 mg/dL (ref 6–20)
CO2: 27 mmol/L (ref 22–32)
Calcium: 8.9 mg/dL (ref 8.9–10.3)
Chloride: 100 mmol/L (ref 98–111)
Creatinine, Ser: 1.28 mg/dL — ABNORMAL HIGH (ref 0.61–1.24)
GFR, Estimated: >60 mL/min (ref >60)
Glucose, Bld: 132 mg/dL — ABNORMAL HIGH (ref 70–99)
Potassium: 3.8 mmol/L (ref 3.5–5.1)
Sodium: 137 mmol/L (ref 135–145)
Total Bilirubin: 0.6 mg/dL (ref 0.3–1.2)
Total Protein: 7.4 g/dL (ref 6.5–8.1)

## 2020-07-21 LAB — HIV ANTIBODY (ROUTINE TESTING W REFLEX): HIV Screen 4th Generation wRfx: NONREACTIVE

## 2020-07-21 LAB — FIBRIN DERIVATIVES D-DIMER (ARMC ONLY): Fibrin derivatives D-dimer (ARMC): 217.07 ng/mL (FEU) (ref 0.00–499.00)

## 2020-07-21 LAB — LIPASE, BLOOD: Lipase: 53 U/L — ABNORMAL HIGH (ref 11–51)

## 2020-07-21 MED ORDER — ONDANSETRON HCL 4 MG/2ML IJ SOLN
4.0000 mg | Freq: Four times a day (QID) | INTRAMUSCULAR | Status: DC | PRN
  Administered 2020-07-21: 4 mg via INTRAVENOUS
  Filled 2020-07-21: qty 2

## 2020-07-21 MED ORDER — TRAZODONE HCL 100 MG PO TABS
300.0000 mg | ORAL_TABLET | Freq: Every day | ORAL | Status: DC
  Administered 2020-07-21: 300 mg via ORAL
  Filled 2020-07-21: qty 3

## 2020-07-21 MED ORDER — BENAZEPRIL HCL 20 MG PO TABS
20.0000 mg | ORAL_TABLET | Freq: Every day | ORAL | Status: DC
  Administered 2020-07-21: 20 mg via ORAL
  Filled 2020-07-21: qty 1

## 2020-07-21 MED ORDER — QUETIAPINE FUMARATE 25 MG PO TABS: 50.0000 mg | ORAL_TABLET | Freq: Every day | ORAL | Status: DC

## 2020-07-21 MED ORDER — FLUTICASONE PROPIONATE 50 MCG/ACT NA SUSP
1.0000 | Freq: Every day | NASAL | Status: DC | PRN
  Filled 2020-07-21: qty 16

## 2020-07-21 MED ORDER — MORPHINE SULFATE (PF) 4 MG/ML IV SOLN
4.0000 mg | INTRAVENOUS | Status: DC | PRN
  Administered 2020-07-21: 4 mg via INTRAVENOUS
  Filled 2020-07-21: qty 1

## 2020-07-21 MED ORDER — MOMETASONE FURO-FORMOTEROL FUM 200-5 MCG/ACT IN AERO
2.0000 | INHALATION_SPRAY | Freq: Two times a day (BID) | RESPIRATORY_TRACT | Status: DC
  Filled 2020-07-21: qty 8.8

## 2020-07-21 MED ORDER — METOPROLOL SUCCINATE ER 25 MG PO TB24
25.0000 mg | ORAL_TABLET | Freq: Every day | ORAL | Status: DC
  Administered 2020-07-21 – 2020-07-22 (×2): 25 mg via ORAL
  Filled 2020-07-21 (×2): qty 1

## 2020-07-21 MED ORDER — NITROGLYCERIN 0.4 MG SL SUBL
0.4000 mg | SUBLINGUAL_TABLET | SUBLINGUAL | Status: DC | PRN
  Administered 2020-07-21: 0.4 mg via SUBLINGUAL
  Filled 2020-07-21: qty 1

## 2020-07-21 MED ORDER — NITROGLYCERIN 0.4 MG SL SUBL
SUBLINGUAL_TABLET | SUBLINGUAL | Status: AC
  Administered 2020-07-21: 0.4 mg via SUBLINGUAL
  Filled 2020-07-21: qty 1

## 2020-07-21 MED ORDER — FLUTICASONE FUROATE-VILANTEROL 200-25 MCG/INH IN AEPB
1.0000 | INHALATION_SPRAY | Freq: Every day | RESPIRATORY_TRACT | Status: DC
  Filled 2020-07-21: qty 28

## 2020-07-21 MED ORDER — ALUM & MAG HYDROXIDE-SIMETH 200-200-20 MG/5ML PO SUSP
30.0000 mL | Freq: Once | ORAL | Status: AC
  Administered 2020-07-21: 30 mL via ORAL
  Filled 2020-07-21: qty 30

## 2020-07-21 MED ORDER — BENAZEPRIL-HYDROCHLOROTHIAZIDE 20-12.5 MG PO TABS: 1.0000 | ORAL_TABLET | Freq: Every day | ORAL | Status: DC

## 2020-07-21 MED ORDER — ONDANSETRON HCL 4 MG/2ML IJ SOLN
INTRAMUSCULAR | Status: AC
  Administered 2020-07-21: 4 mg
  Filled 2020-07-21: qty 2

## 2020-07-21 MED ORDER — LIDOCAINE VISCOUS HCL 2 % MT SOLN
15.0000 mL | Freq: Once | OROMUCOSAL | Status: AC
  Administered 2020-07-21: 15 mL via ORAL
  Filled 2020-07-21: qty 15

## 2020-07-21 MED ORDER — ASPIRIN 81 MG PO CHEW: 324.0000 mg | CHEWABLE_TABLET | ORAL | Status: DC

## 2020-07-21 MED ORDER — NITROGLYCERIN 2 % TD OINT
0.5000 [in_us] | TOPICAL_OINTMENT | Freq: Once | TRANSDERMAL | Status: AC
  Administered 2020-07-21: 0.5 [in_us] via TOPICAL
  Filled 2020-07-21: qty 1

## 2020-07-21 MED ORDER — ASPIRIN 300 MG RE SUPP: 300.0000 mg | RECTAL | Status: DC

## 2020-07-21 MED ORDER — PANTOPRAZOLE SODIUM 40 MG PO TBEC
40.0000 mg | DELAYED_RELEASE_TABLET | Freq: Every day | ORAL | Status: DC
  Administered 2020-07-21 – 2020-07-22 (×2): 40 mg via ORAL
  Filled 2020-07-21 (×2): qty 1

## 2020-07-21 MED ORDER — ACETAMINOPHEN 325 MG PO TABS: 650.0000 mg | ORAL_TABLET | ORAL | Status: DC | PRN

## 2020-07-21 MED ORDER — PROMETHAZINE HCL 25 MG/ML IJ SOLN
12.5000 mg | Freq: Four times a day (QID) | INTRAMUSCULAR | Status: DC | PRN
  Administered 2020-07-21: 12.5 mg via INTRAVENOUS
  Filled 2020-07-21: qty 1

## 2020-07-21 MED ORDER — ATORVASTATIN CALCIUM 20 MG PO TABS
40.0000 mg | ORAL_TABLET | Freq: Every day | ORAL | Status: DC
  Administered 2020-07-21 – 2020-07-22 (×2): 40 mg via ORAL
  Filled 2020-07-21 (×2): qty 2

## 2020-07-21 MED ORDER — AMPHETAMINE-DEXTROAMPHET ER 25 MG PO CP24
25.0000 mg | ORAL_CAPSULE | Freq: Every day | ORAL | Status: DC
  Filled 2020-07-21: qty 1

## 2020-07-21 MED ORDER — CYCLOBENZAPRINE HCL 10 MG PO TABS
10.0000 mg | ORAL_TABLET | Freq: Three times a day (TID) | ORAL | Status: DC | PRN
  Administered 2020-07-21: 10 mg via ORAL
  Filled 2020-07-21: qty 1

## 2020-07-21 MED ORDER — ENOXAPARIN SODIUM 80 MG/0.8ML ~~LOC~~ SOLN
0.5000 mg/kg | SUBCUTANEOUS | Status: DC
  Filled 2020-07-21: qty 0.8

## 2020-07-21 MED ORDER — SODIUM CHLORIDE 0.9 % IV BOLUS
500.0000 mL | Freq: Once | INTRAVENOUS | Status: AC
  Administered 2020-07-21: 500 mL via INTRAVENOUS

## 2020-07-21 MED ORDER — HYDROCHLOROTHIAZIDE 12.5 MG PO CAPS
12.5000 mg | ORAL_CAPSULE | Freq: Every day | ORAL | Status: DC
  Administered 2020-07-21: 12.5 mg via ORAL
  Filled 2020-07-21: qty 1

## 2020-07-21 MED ORDER — ASPIRIN EC 81 MG PO TBEC
81.0000 mg | DELAYED_RELEASE_TABLET | Freq: Every day | ORAL | Status: DC
  Administered 2020-07-22: 81 mg via ORAL
  Filled 2020-07-21: qty 1

## 2020-07-21 MED ORDER — ENOXAPARIN SODIUM 40 MG/0.4ML ~~LOC~~ SOLN: 40.0000 mg | SUBCUTANEOUS | Status: DC

## 2020-07-21 NOTE — Progress Notes (Signed)
*  PRELIMINARY RESULTS* Echocardiogram 2D Echocardiogram has been performed.  Sherrie Sport 07/21/2020, 2:27 PM

## 2020-07-21 NOTE — H&P (Signed)
History and Physical    [name] YDX:[id number] DOB: 04-15-1971 DOA: 07/21/2020  PCP: Idelle Crouch, MD   Patient coming from: Home I have personally briefly reviewed patient's old medical records in [place]  Chief Complaint: Chest pain  HPI: [name] is a 49 y.o. male with medical history significant for ADHD, hypertension, dyslipidemia and significant family history of cardiovascular disease who presents to the ER for evaluation of chest pain which woke him up from sleep around 5 AM on the day of his admission.  He rated his pain an 8 x 10 in intensity at its worst.  Chest pain was mostly midsternal, nonradiating and associated with diaphoresis, nausea and vomiting.  Patient states that he has never had pain like this in the past.  He did take omeprazole without any relief of his pain.  He did have some relief of his pain with nitroglycerin but chest pain is not completely resolved and he rates it a 4 x 10 in intensity at its worst at this time. He denies having any fever or chills, no cough, no shortness of breath, no dizziness, no lightheadedness, no abdominal pain, no changes in his bowel habits, no urinary symptoms. Labs show sodium 137, potassium 3.8, chloride 100, bicarb 27, BUN 15, creatinine 1.28, calcium 8.9, alkaline phosphatase 99, albumin 4.1, lipase 53, AST 25, ALT 37, total protein 7.4, total bilirubin 0.6, troponin IV, white count 7.0, hemoglobin 15.2, hematocrit 45.6, MCV 88.5, RDW 13.1, platelet count 318, fibrin derivatives 217,  Chest x-ray reviewed by me shows low lung volumes and mild pulmonary vascular congestion. Twelve-lead EKG reviewed by me shows normal sinus rhythm with no acute ST or T wave changes  ED Course: Patient is a 49 year old Caucasian male who presents to the ER for evaluation of chest pain mostly midsternal which woke him up in the early hours of the morning associated with diaphoresis, nausea and vomiting.  He rated his pain 10 x 10 in  intensity at its worst and had some improvement with nitroglycerin.  He currently rates his pain a 4 x 10 in intensity.  He will be referred to observation status for further evaluation.  Review of Systems: As per HPI otherwise 10 point review of systems negative.    Past Medical History:  Diagnosis Date  . ADHD   . Allergies   . AVN (avascular necrosis of bone) (HCC)    r femural head  . Complication of anesthesia    pt. states that he wakes up during procedures  . Hypertension   . Pneumonia     Past Surgical History:  Procedure Laterality Date  . COLONOSCOPY N/A 04/01/2015   Procedure: COLONOSCOPY;  Surgeon: [name], MD;  Location: [place];  Service: Gastroenterology;  Laterality: N/A;  . COLONOSCOPY WITH PROPOFOL N/A 12/30/2019   Procedure: COLONOSCOPY WITH BIOPSY;  Surgeon: [name], MD;  Location: [place];  Service: Endoscopy;  Laterality: N/A;  priority 3  . GANGLION CYST EXCISION    . POLYPECTOMY  04/01/2015   Procedure: POLYPECTOMY;  Surgeon: [name], MD;  Location: [place];  Service: Gastroenterology;;  . POLYPECTOMY N/A 12/30/2019   Procedure: POLYPECTOMY;  Surgeon: [name], MD;  Location: [place];  Service: Endoscopy;  Laterality: N/A;  . TONGUE FLAP RELEASE    . TOTAL HIP ARTHROPLASTY  10/07/2011   Procedure: TOTAL HIP ARTHROPLASTY;  Surgeon: Kerin Salen, MD;  Location: [place];  Service: Orthopedics;  Laterality:  Right;  [name] TOTAL HIP ARTHROPLASTY Left 02/17/2017   Procedure: TOTAL HIP ARTHROPLASTY ANTERIOR APPROACH;  Surgeon: Frederik Pear, MD;  Location: [place];  Service: Orthopedics;  Laterality: Left;     reports that he has never smoked. He has never used smokeless tobacco. He reports that he does not drink alcohol and does not use drugs.  Allergies  Allergen Reactions  . No Known Allergies     Family History  Problem Relation Age of Onset  . Cancer Mother   . Cancer Sister   . Colon cancer Sister        Prior to Admission medications   Medication Sig Start Date End Date Taking? Authorizing Provider  amphetamine-dextroamphetamine (ADDERALL XR) 25 MG 24 hr capsule Take 25 mg by mouth daily. 04/10/19  Yes [provider]  benazepril-hydrochlorthiazide (LOTENSIN HCT) 20-12.5 MG tablet Take 1 tablet by mouth daily.   Yes [provider]  QUEtiapine (SEROQUEL) 50 MG tablet Take 50 mg by mouth at bedtime. 06/30/20  Yes [provider]  traZODone (DESYREL) 150 MG tablet 300 mg at bedtime.  12/17/18  Yes [provider]  ADVAIR DISKUS 250-50 MCG/DOSE AEPB Inhale 1 puff into the lungs daily. 02/25/20   [provider]  azithromycin (ZITHROMAX Z-PAK) 250 MG tablet Take 2 tablets (500 mg) on  Day 1,  followed by 1 tablet (250 mg) once daily on Days 2 through 5. Patient not taking: Reported on 12/18/2019 09/27/19   [name], MD  cyclobenzaprine (FLEXERIL) 10 MG tablet Take 1 tablet (10 mg total) by mouth 3 (three) times daily. Patient not taking: Reported on 07/21/2020 06/10/19   Merlyn Lot, MD  fluticasone [facility]) 50 MCG/ACT nasal spray Place 1 spray into the nose daily as needed for allergies.  08/08/18   [provider]  ketorolac (TORADOL) 10 MG tablet Take 10 mg by mouth daily as needed.  08/08/18   [provider]  omeprazole (PRILOSEC) 20 MG capsule Take 20 mg by mouth daily as needed.  04/10/18   [provider]  oxyCODONE-acetaminophen (PERCOCET) 5-325 MG tablet Take 1 tablet by mouth every 8 (eight) hours as needed. Patient not taking: Reported on 12/18/2019 09/27/19   [name], MD    Physical Exam: Vitals:   07/21/20 1045 07/21/20 1100 07/21/20 1115 07/21/20 1238  BP:  110/76  138/74  Pulse: 63 (!) 58 66 (!) 57  Resp: 14 12 18 16   Temp:      TempSrc:      SpO2: 99% 96% 98% 98%  Weight:      Height:         Vitals:   07/21/20 1045 07/21/20 1100 07/21/20 1115 07/21/20 1238  BP:  110/76   138/74  Pulse: 63 (!) 58 66 (!) 57  Resp: 14 12 18 16   Temp:      TempSrc:      SpO2: 99% 96% 98% 98%  Weight:      Height:        Constitutional: NAD, alert and oriented x 3 Eyes: PERRL, lids and conjunctivae normal ENMT: Mucous membranes are moist.  Neck: normal, supple, no masses, no thyromegaly Respiratory: clear to auscultation bilaterally, no wheezing, no crackles. Normal respiratory effort. No accessory muscle use.  Cardiovascular: Regular rate and rhythm, no murmurs / rubs / gallops. No extremity edema. 2+ pedal pulses. No carotid bruits.  Chest pain is nonreproducible Abdomen: no tenderness, no masses palpated. No hepatosplenomegaly. Bowel sounds positive.  Musculoskeletal:  no clubbing / cyanosis. No joint deformity upper and lower extremities.  Skin: no rashes, lesions, ulcers.  Neurologic: No gross focal neurologic deficit. Psychiatric: Normal mood and affect.   Labs on Admission: I have personally reviewed following labs and imaging studies  CBC: Recent Labs  Lab 07/21/20 0821  WBC 7.0  NEUTROABS 4.0  HGB 15.2  HCT 45.6  MCV 88.5  PLT 810   Basic Metabolic Panel: Recent Labs  Lab 07/21/20 0821  NA 137  K 3.8  CL 100  CO2 27  GLUCOSE 132*  BUN 15  CREATININE 1.28*  CALCIUM 8.9   GFR: Estimated Creatinine Clearance: 97.5 mL/min (A) (by C-G formula based on SCr of 1.28 mg/dL (H)). Liver Function Tests: Recent Labs  Lab 07/21/20 0821  AST 25  ALT 37  ALKPHOS 99  BILITOT 0.6  PROT 7.4  ALBUMIN 4.1   Recent Labs  Lab 07/21/20 0821  LIPASE 53*   No results for input(s): AMMONIA in the last 168 hours. Coagulation Profile: No results for input(s): INR, PROTIME in the last 168 hours. Cardiac Enzymes: No results for input(s): CKTOTAL, CKMB, CKMBINDEX, TROPONINI in the last 168 hours. BNP (last 3 results) No results for input(s): PROBNP in the last 8760 hours. HbA1C: No results for input(s): HGBA1C in the last 72 hours. CBG: No results for  input(s): GLUCAP in the last 168 hours. Lipid Profile: No results for input(s): CHOL, HDL, LDLCALC, TRIG, CHOLHDL, LDLDIRECT in the last 72 hours. Thyroid Function Tests: No results for input(s): TSH, T4TOTAL, FREET4, T3FREE, THYROIDAB in the last 72 hours. Anemia Panel: No results for input(s): VITAMINB12, FOLATE, FERRITIN, TIBC, IRON, RETICCTPCT in the last 72 hours. Urine analysis:    Component Value Date/Time   COLORURINE STRAW (A) 06/24/2017 1300   APPEARANCEUR Turbid (A) 07/09/2020 1006   LABSPEC 1.010 06/24/2017 1300   LABSPEC 1.008 07/03/2012 0101   PHURINE 6.0 06/24/2017 1300   GLUCOSEU Negative 07/09/2020 1006   GLUCOSEU Negative 07/03/2012 0101   HGBUR MODERATE (A) 06/24/2017 1300   BILIRUBINUR Negative 07/09/2020 1006   BILIRUBINUR Negative 07/03/2012 0101   KETONESUR NEGATIVE 06/24/2017 1300   PROTEINUR Negative 07/09/2020 1006   PROTEINUR NEGATIVE 06/24/2017 1300   UROBILINOGEN 0.2 08/16/2016 1422   UROBILINOGEN 0.2 10/05/2011 1217   NITRITE Negative 07/09/2020 1006   NITRITE NEGATIVE 06/24/2017 1300   LEUKOCYTESUR Negative 07/09/2020 1006   LEUKOCYTESUR Negative 07/03/2012 0101    Radiological Exams on Admission: DG Chest 2 View  Result Date: 07/21/2020 CLINICAL DATA:  Chest pain. EXAM: CHEST - 2 VIEW COMPARISON:  One-view chest x-ray 09/27/2019 FINDINGS: Heart size is normal. Mild pulmonary vascular congestion is present. No focal airspace opacities are present. This is likely exaggerated by low lung volumes. Visualized soft tissues and bony thorax are unremarkable. IMPRESSION: Low lung volumes and mild pulmonary vascular congestion. Electronically Signed   By: San Morelle M.D.   On: 07/21/2020 09:22   ECHOCARDIOGRAM COMPLETE  Result Date: 07/21/2020    ECHOCARDIOGRAM REPORT   Patient Name:   [name] Date of Exam: 07/21/2020 Medical Rec #:  [id number]     Height:       74.0 in Accession #:    [id number]    Weight:       278.0 lb Date of Birth:   [birth year]     BSA:          2.500 m Patient Age:    89 years      BP:  110/76 mmHg Patient Gender: M             HR:           66 bpm. Exam Location:  [facility] Procedure: 2D Echo, Cardiac Doppler and Color Doppler Indications:     Chest Pain 786.50  History:         Patient has no prior history of Echocardiogram examinations.                  Risk Factors:Hypertension.  Sonographer:     Sherrie Sport RDCS (AE) Referring Phys:  [id number] Collier Bullock Diagnosing Phys: Serafina Royals MD  Sonographer Comments: Suboptimal apical window. IMPRESSIONS  1. Left ventricular ejection fraction, by estimation, is 50 to 55%. The left ventricle has low normal function. The left ventricle has no regional wall motion abnormalities. Left ventricular diastolic parameters were normal.  2. Right ventricular systolic function is normal. The right ventricular size is normal.  3. The mitral valve is normal in structure. Trivial mitral valve regurgitation.  4. The aortic valve is normal in structure. Aortic valve regurgitation is trivial. FINDINGS  Left Ventricle: Left ventricular ejection fraction, by estimation, is 50 to 55%. The left ventricle has low normal function. The left ventricle has no regional wall motion abnormalities. The left ventricular internal cavity size was normal in size. There is no left ventricular hypertrophy. Left ventricular diastolic parameters were normal. Right Ventricle: The right ventricular size is normal. No increase in right ventricular wall thickness. Right ventricular systolic function is normal. Left Atrium: Left atrial size was normal in size. Right Atrium: Right atrial size was normal in size. Pericardium: There is no evidence of pericardial effusion. Mitral Valve: The mitral valve is normal in structure. Trivial mitral valve regurgitation. Tricuspid Valve: The tricuspid valve is normal in structure. Tricuspid valve regurgitation is trivial. Aortic Valve: The aortic valve is normal in structure.  Aortic valve regurgitation is trivial. Aortic valve mean gradient measures 3.5 mmHg. Aortic valve peak gradient measures 6.2 mmHg. Aortic valve area, by VTI measures 2.18 cm. Pulmonic Valve: The pulmonic valve was normal in structure. Pulmonic valve regurgitation is not visualized. Aorta: The aortic root and ascending aorta are structurally normal, with no evidence of dilitation. IAS/Shunts: No atrial level shunt detected by color flow Doppler.  LEFT VENTRICLE PLAX 2D LVIDd:         4.97 cm  Diastology LVIDs:         3.13 cm  LV e' medial:    11.00 cm/s LV PW:         1.26 cm  LV E/e' medial:  7.0 LV IVS:        0.94 cm  LV e' lateral:   10.40 cm/s LVOT diam:     2.00 cm  LV E/e' lateral: 7.4 LV SV:         56 LV SV Index:   22 LVOT Area:     3.14 cm  RIGHT VENTRICLE RV Basal diam:  3.59 cm RV S prime:     11.40 cm/s TAPSE (M-mode): 3.9 cm LEFT ATRIUM              Index       RIGHT ATRIUM           Index LA diam:        3.70 cm  1.48 cm/m  RA Area:     20.40 cm LA Vol (A2C):   102.0 ml 40.80 ml/m RA Volume:   65.50  ml  26.20 ml/m LA Vol (A4C):   45.5 ml  18.20 ml/m LA Biplane Vol: 67.3 ml  26.92 ml/m  AORTIC VALVE                   PULMONIC VALVE AV Area (Vmax):    2.18 cm    PV Vmax:        0.65 m/s AV Area (Vmean):   2.38 cm    PV Peak grad:   1.7 mmHg AV Area (VTI):     2.18 cm    RVOT Peak grad: 2 mmHg AV Vmax:           124.00 cm/s AV Vmean:          87.350 cm/s AV VTI:            0.257 m AV Peak Grad:      6.2 mmHg AV Mean Grad:      3.5 mmHg LVOT Vmax:         86.20 cm/s LVOT Vmean:        66.200 cm/s LVOT VTI:          0.178 m LVOT/AV VTI ratio: 0.69  AORTA Ao Root diam: 3.30 cm MITRAL VALVE               TRICUSPID VALVE MV Area (PHT): 4.68 cm    TR Peak grad:   22.7 mmHg MV Decel Time: 162 msec    TR Vmax:        238.00 cm/s MV E velocity: 76.70 cm/s MV A velocity: 58.70 cm/s  SHUNTS MV E/A ratio:  1.31        Systemic VTI:  0.18 m                            Systemic Diam: 2.00 cm Serafina Royals  MD Electronically signed by Serafina Royals MD Signature Date/Time: 07/21/2020/3:26:01 PM    Final     EKG: Independently reviewed.  Normal sinus rhythm  Assessment/Plan Principal Problem:   Unstable angina (HCC) Active Problems:   Adult ADHD   Hypertension   Unstable angina Patient with a history of hypertension and significant family history of coronary artery disease who presents to the ER for evaluation of midsternal chest pain that woke him up from sleep which she rated 10 x 10 in intensity at its worst associated with diaphoresis, nausea and vomiting. Patient responded to nitroglycerin and continues to have chest pain but is down to 4 x 10 in intensity at its worst Twelve-lead EKG shows no acute findings and troponin is negative Place patient on aspirin, beta-blocker and statins We will request cardiology consult   Adult ADHD Continue Adderall   Hypertension Continue benazepril, hydrochlorothiazide normal adult dose beta-blocker   DVT prophylaxis: Lovenox Code Status: Full code Family Communication: Greater than 50% of time was spent discussing plan of care with patient at the bedside.  He verbalizes understanding and agrees with the plan.  All questions and concerns have been addressed. Disposition Plan: Back to previous home environment Consults called: Cardiology    Allysa Governale MD [facility]     07/21/2020, 3:27 PM

## 2020-07-21 NOTE — ED Provider Notes (Signed)
[facility] Emergency Department Provider Note    First MD Initiated Contact with Patient 07/21/20 [phone]     (approximate)  I have reviewed the triage vital signs and the nursing notes.   HISTORY  Chief Complaint Chest Pain    HPI [name] is a 49 y.o. male the below listed past medical history presents to the ER for midsternal nonradiating chest pain and pressure associated with diaphoresis started this morning woke him up from sleep around 5 AM.  Is never had pain like this before.  Did take omeprazole without any relief.  He is a local paramedic well-known to the department so he called EMS for twelve-lead was directed to the ER based on his symptoms.  He does have a history of high cholesterol and hypertension.  Noncompliant with his medications.  Does have family history of CAD no personal history of CAD.    Past Medical History:  Diagnosis Date  . ADHD   . Allergies   . AVN (avascular necrosis of bone) (HCC)    r femural head  . Complication of anesthesia    pt. states that he wakes up during procedures  . Hypertension   . Pneumonia    Family History  Problem Relation Age of Onset  . Cancer Mother   . Cancer Sister   . Colon cancer Sister    Past Surgical History:  Procedure Laterality Date  . COLONOSCOPY N/A 04/01/2015   Procedure: COLONOSCOPY;  Surgeon: [name], MD;  Location: [place];  Service: Gastroenterology;  Laterality: N/A;  . COLONOSCOPY WITH PROPOFOL N/A 12/30/2019   Procedure: COLONOSCOPY WITH BIOPSY;  Surgeon: [name], MD;  Location: [place];  Service: Endoscopy;  Laterality: N/A;  priority 3  . GANGLION CYST EXCISION    . POLYPECTOMY  04/01/2015   Procedure: POLYPECTOMY;  Surgeon: [name], MD;  Location: [place];  Service: Gastroenterology;;  . POLYPECTOMY N/A 12/30/2019   Procedure: POLYPECTOMY;  Surgeon: [name], MD;  Location: [place];  Service: Endoscopy;   Laterality: N/A;  . TONGUE FLAP RELEASE    . TOTAL HIP ARTHROPLASTY  10/07/2011   Procedure: TOTAL HIP ARTHROPLASTY;  Surgeon: Kerin Salen, MD;  Location: [place];  Service: Orthopedics;  Laterality: Right;  . TOTAL HIP ARTHROPLASTY Left 02/17/2017   Procedure: TOTAL HIP ARTHROPLASTY ANTERIOR APPROACH;  Surgeon: Frederik Pear, MD;  Location: [place];  Service: Orthopedics;  Laterality: Left;   Patient Active Problem List   Diagnosis Date Noted  . Personal history of colonic polyps   . Rectal polyp   . Polyp of transverse colon   . Bronchitis- mild  10/11/2018  . Cough- post nasal drip  10/11/2018  . Acute non-recurrent maxillary sinusitis 10/11/2018  . CAP (community acquired pneumonia) 10/02/2017  . AVN of femur ([place]) 02/17/2017  . Fracture of femoral neck, left ([place]) 02/14/2017  . Hyperlipemia, mixed 10/22/2015  . Adult ADHD 07/17/2015  . Kidney stones 07/17/2015  . Avascular necrosis of hip Right with collapse 10/07/2011      Prior to Admission medications   Medication Sig Start Date End Date Taking? Authorizing Provider  amphetamine-dextroamphetamine (ADDERALL XR) 25 MG 24 hr capsule Take 25 mg by mouth daily. 04/10/19   [provider]  azithromycin (ZITHROMAX Z-PAK) 250 MG tablet Take 2 tablets (500 mg) on  Day 1,  followed by 1 tablet (250 mg) once daily on Days 2 through 5. Patient not taking: Reported on  12/18/2019 09/27/19   [name], MD  benazepril (LOTENSIN) 20 MG tablet Take 20 mg by mouth daily.  06/22/18 06/22/19  [provider]  benazepril-hydrochlorthiazide (LOTENSIN HCT) 20-12.5 MG tablet Take 1 tablet by mouth daily.    [provider]  budesonide (PULMICORT FLEXHALER) 180 MCG/ACT inhaler Inhale into the lungs as needed.  10/12/17 10/12/18  [provider]  cefUROXime (CEFTIN) 500 MG tablet TK 1 T PO Q 12 H FOR 10 DAYS 12/26/18   [provider]  cyclobenzaprine (FLEXERIL) 10 MG tablet Take 1 tablet (10 mg total) by mouth  3 (three) times daily. Patient taking differently: Take 10 mg by mouth as needed.  06/10/19   Merlyn Lot, MD  fluticasone [facility]) 50 MCG/ACT nasal spray Place 1-2 sprays into the nose as needed.  08/08/18   [provider]  fluticasone (FLOVENT HFA) 110 MCG/ACT inhaler Inhale into the lungs as needed.  10/19/18 10/19/19  [provider]  ketorolac (TORADOL) 10 MG tablet Take 10 mg by mouth as needed.  08/08/18   [provider]  meloxicam (MOBIC) 7.5 MG tablet Take 7.5 mg by mouth daily as needed.    [provider]  montelukast (SINGULAIR) 10 MG tablet Take 10 mg by mouth as needed.  11/02/17 11/02/18  [provider]  omeprazole (PRILOSEC) 20 MG capsule Take 20 mg by mouth as needed.  04/10/18   [provider]  ondansetron (ZOFRAN) 4 MG tablet Take 4 mg by mouth every 8 (eight) hours as needed.  09/24/18   [provider]  oxyCODONE-acetaminophen (PERCOCET) 5-325 MG tablet Take 1 tablet by mouth every 8 (eight) hours as needed. Patient not taking: Reported on 12/18/2019 09/27/19   [name], MD  tamsulosin (FLOMAX) 0.4 MG CAPS capsule Take 0.4 mg by mouth as needed.  08/08/18   [provider]  tiotropium (SPIRIVA) 18 MCG inhalation capsule Place 18 mcg into inhaler and inhale daily.  11/02/17 11/02/18  [provider]  traZODone (DESYREL) 150 MG tablet 300 mg at bedtime.  12/17/18   [provider]    Allergies No known allergies    Social History Social History   Tobacco Use  . Smoking status: Never Smoker  . Smokeless tobacco: Never Used  Substance Use Topics  . Alcohol use: No  . Drug use: No    Review of Systems Patient denies headaches, rhinorrhea, blurry vision, numbness, shortness of breath, chest pain, edema, cough, abdominal pain, nausea, vomiting, diarrhea, dysuria, fevers, rashes or hallucinations unless otherwise stated above in  HPI. ____________________________________________   PHYSICAL EXAM:  VITAL SIGNS: Vitals:   07/21/20 0811  BP: (!) 155/106  Pulse: 76  Resp: 18  Temp: 98.1 F (36.7 C)  SpO2: 97%    Constitutional: Alert and oriented.  Eyes: Conjunctivae are normal.  Head: Atraumatic. Nose: No congestion/rhinnorhea. Mouth/Throat: Mucous membranes are moist.   Neck: No stridor. Painless ROM.  Cardiovascular: Normal rate, regular rhythm. Grossly normal heart sounds.  Good peripheral circulation. Respiratory: Normal respiratory effort.  No retractions. Lungs CTAB. Gastrointestinal: Soft and nontender. No distention. No abdominal bruits. No CVA tenderness. Genitourinary:  Musculoskeletal: No lower extremity tenderness nor edema.  No joint effusions. Neurologic:  Normal speech and language. No gross focal neurologic deficits are appreciated. No facial droop Skin:  Skin is warm, dry and intact. No rash noted. Psychiatric: Mood and affect are normal. Speech and behavior are normal.  ____________________________________________   LABS (all labs ordered are listed, but  only abnormal results are displayed)  No results found for this or any previous visit (from the past 24 hour(s)). ____________________________________________  EKG My review and personal interpretation at Time: 8:07   Indication: chest pain  Rate: 75  Rhythm: sinus Axis: normal Other: normal intervals, no stemi ____________________________________________  RADIOLOGY  I personally reviewed all radiographic images ordered to evaluate for the above acute complaints and reviewed radiology reports and findings.  These findings were personally discussed with the patient.  Please see medical record for radiology report.  ____________________________________________   PROCEDURES  Procedure(s) performed:  Procedures    Critical Care performed: no ____________________________________________   INITIAL IMPRESSION / ASSESSMENT  AND PLAN / ED COURSE  Pertinent labs & imaging results that were available during my care of the patient were reviewed by me and considered in my medical decision making (see chart for details).   DDX: ACS, pericarditis, esophagitis, boerhaaves, pe, dissection, pna, bronchitis, costochondritis   Ashly D Pundt is a 49 y.o. who presents to the ED with presentation as described above.  Patient is uncomfortable appearing describing features concerning for ACS per his initial EKG is negative.  Will order blood work for above differential.  Have a lower suspicion for dissection or PE but will order D-dimer to further stratify.  Does not seem to be having any abdominal pain.  May be having some hypertensive urgency given his high blood pressure when he arrives will give nitro and reassess.  Clinical Course as of Jul 21 1233  Tue Jul 21, 2020  4132 Patient did feel like he was having some improvement after nitro but feels like the pain is coming back.  Also having some associated nausea.   [PR]  1004 Patient does feel significant provement after nitro but once he starts wearing off starts having recurrent pain.   [PR]  4401 Patient now states that the discomfort is returning also having some nausea but again denies any epigastric or back pain.  Lipase is only borderline elevated have a lower suspicion for pancreatitis.   [PR]  1119 Gust in consultation with Dr. Nehemiah Massed who is going to come evaluate patient at bedside.   [PR]    Clinical Course User Index [PR] Merlyn Lot, MD    The patient was evaluated in Emergency Department today for the symptoms described in the history of present illness. He/she was evaluated in the context of the global COVID-19 pandemic, which necessitated consideration that the patient might be at risk for infection with the SARS-CoV-2 virus that causes COVID-19. Institutional protocols and algorithms that pertain to the evaluation of patients at risk for COVID-19 are  in a state of rapid change based on information released by regulatory bodies including the CDC and federal and state organizations. These policies and algorithms were followed during the patient's care in the ED.  As part of my medical decision making, I reviewed the following data within the [place] notes reviewed and incorporated, Labs reviewed, notes from prior ED visits and [place] Controlled Substance Database   ____________________________________________   FINAL CLINICAL IMPRESSION(S) / ED DIAGNOSES  Final diagnoses:  Chest pain, unspecified type      NEW MEDICATIONS STARTED DURING THIS VISIT:  New Prescriptions   No medications on file     Note:  This document was prepared using Dragon voice recognition software and may include unintentional dictation errors.    Merlyn Lot, MD 07/21/20 1235

## 2020-07-21 NOTE — ED Notes (Signed)
Lab at bedside

## 2020-07-21 NOTE — Consult Note (Signed)
[name], [name], DOB/AGE: 1971-01-15 49 y.o. Admit date: 07/21/2020   Date of Consult: 07/21/2020 Primary Physician: Idelle Crouch, MD Primary Cardiologist: None  Chief Complaint:  Chief Complaint  Patient presents with  . Chest Pain   Reason for Consult: Chest pain  HPI: 49 y.o. male with known hypertension hyperlipidemia and family history of cardiovascular disease who has done fairly well with appropriate medication management although has not been significantly compliant with the medication management of late.  Patient has not had any significant new issues of shortness of breath weakness fatigue with physical activity or work the patient suddenly awakened last night with significant chest discomfort and upper chest somewhat radiating to the back.  This was related to shortness of breath and slight amount of nausea and a lot of diaphoresis.  With this the patient was seen by the EMS of which had reported some EKG changes but none have been seen at this time.  In addition to that he was sent to the emergency room.  At that time the patient did receive nitroglycerin which did help his pain for some time and then it came back.  He had further discomfort that was now relieved by morphine there has been no evidence of significant new EKG changes.  In addition to that the patient has had no elevation of troponin with a troponin peak of 7.  Currently he is on pain-free of his atypical chest discomfort and has had some somnolence difficulty with the morphine.  After discussion of invasive and noninvasive evaluations we have decided to continue observation for a while to decide on above  Past Medical History:  Diagnosis Date  . ADHD   . Allergies   . AVN (avascular necrosis of bone) (HCC)    r femural head  . Complication of anesthesia    pt. states that he wakes up during procedures  . Hypertension   . Pneumonia        Surgical History:  Past Surgical History:  Procedure Laterality Date  . COLONOSCOPY N/A 04/01/2015   Procedure: COLONOSCOPY;  Surgeon: [name], MD;  Location: [place];  Service: Gastroenterology;  Laterality: N/A;  . COLONOSCOPY WITH PROPOFOL N/A 12/30/2019   Procedure: COLONOSCOPY WITH BIOPSY;  Surgeon: [name], MD;  Location: [place];  Service: Endoscopy;  Laterality: N/A;  priority 3  . GANGLION CYST EXCISION    . POLYPECTOMY  04/01/2015   Procedure: POLYPECTOMY;  Surgeon: [name], MD;  Location: [place];  Service: Gastroenterology;;  . POLYPECTOMY N/A 12/30/2019   Procedure: POLYPECTOMY;  Surgeon: [name], MD;  Location: [place];  Service: Endoscopy;  Laterality: N/A;  . TONGUE FLAP RELEASE    . TOTAL HIP ARTHROPLASTY  10/07/2011   Procedure: TOTAL HIP ARTHROPLASTY;  Surgeon: Kerin Salen, MD;  Location: [place];  Service: Orthopedics;  Laterality: Right;  . TOTAL HIP ARTHROPLASTY Left 02/17/2017   Procedure: TOTAL HIP ARTHROPLASTY ANTERIOR APPROACH;  Surgeon: Frederik Pear, MD;  Location: [place];  Service: Orthopedics;  Laterality: Left;     Home Meds: Prior to Admission medications   Medication Sig Start Date End Date Taking? Authorizing Provider  amphetamine-dextroamphetamine (ADDERALL XR) 25 MG 24 hr capsule Take 25 mg by mouth daily. 04/10/19  Yes [provider]  benazepril-hydrochlorthiazide (LOTENSIN HCT) 20-12.5 MG tablet Take 1 tablet by mouth daily.   Yes [provider]  QUEtiapine (SEROQUEL) 50  MG tablet Take 50 mg by mouth at bedtime. 06/30/20  Yes [provider]  traZODone (DESYREL) 150 MG tablet 300 mg at bedtime.  12/17/18  Yes [provider]  ADVAIR DISKUS 250-50 MCG/DOSE AEPB Inhale 1 puff into the lungs daily. 02/25/20   [provider]  azithromycin (ZITHROMAX Z-PAK) 250 MG tablet Take 2 tablets (500 mg) on  Day 1,  followed by 1 tablet (250 mg) once daily on Days  2 through 5. Patient not taking: Reported on 12/18/2019 09/27/19   [name], MD  cyclobenzaprine (FLEXERIL) 10 MG tablet Take 1 tablet (10 mg total) by mouth 3 (three) times daily. Patient not taking: Reported on 07/21/2020 06/10/19   Merlyn Lot, MD  fluticasone [facility]) 50 MCG/ACT nasal spray Place 1 spray into the nose daily as needed for allergies.  08/08/18   [provider]  ketorolac (TORADOL) 10 MG tablet Take 10 mg by mouth daily as needed.  08/08/18   [provider]  omeprazole (PRILOSEC) 20 MG capsule Take 20 mg by mouth daily as needed.  04/10/18   [provider]  oxyCODONE-acetaminophen (PERCOCET) 5-325 MG tablet Take 1 tablet by mouth every 8 (eight) hours as needed. Patient not taking: Reported on 12/18/2019 09/27/19   [name], MD    Inpatient Medications:     Allergies:  Allergies  Allergen Reactions  . No Known Allergies     Social History   Socioeconomic History  . Marital status: Married    Spouse name: Not on file  . Number of children: Not on file  . Years of education: Not on file  . Highest education level: Not on file  Occupational History  . Not on file  Tobacco Use  . Smoking status: Never Smoker  . Smokeless tobacco: Never Used  Substance and Sexual Activity  . Alcohol use: No  . Drug use: No  . Sexual activity: Yes  Other Topics Concern  . Not on file  Social History Narrative  . Not on file   Social Determinants of Health   Financial Resource Strain:   . Difficulty of Paying Living Expenses: Not on file  Food Insecurity:   . Worried About Charity fundraiser in the Last Year: Not on file  . Ran Out of Food in the Last Year: Not on file  Transportation Needs:   . Lack of Transportation (Medical): Not on file  . Lack of Transportation (Non-Medical): Not on file  Physical Activity:   . Days of Exercise per Week: Not on file  . Minutes of Exercise per Session: Not on file  Stress:   .  Feeling of Stress : Not on file  Social Connections:   . Frequency of Communication with Friends and Family: Not on file  . Frequency of Social Gatherings with Friends and Family: Not on file  . Attends Religious Services: Not on file  . Active Member of Clubs or Organizations: Not on file  . Attends Archivist Meetings: Not on file  . Marital Status: Not on file  Intimate Partner Violence:   . Fear of Current or Ex-Partner: Not on file  . Emotionally Abused: Not on file  . Physically Abused: Not on file  . Sexually Abused: Not on file     Family History  Problem Relation Age of Onset  . Cancer Mother   . Cancer Sister   . Colon cancer Sister      Review of Systems Positive  for chest pain Negative for: General:  chills, fever, night sweats or weight changes.  Cardiovascular: PND orthopnea syncope dizziness  Dermatological skin lesions rashes Respiratory: Cough congestion Urologic: Frequent urination urination at night and hematuria Abdominal: negative for nausea, vomiting, diarrhea, bright red blood per rectum, melena, or hematemesis Neurologic: negative for visual changes, and/or hearing changes  All other systems reviewed and are otherwise negative except as noted above.  Labs: No results for input(s): CKTOTAL, CKMB, TROPONINI in the last 72 hours. Lab Results  Component Value Date   WBC 7.0 07/21/2020   HGB 15.2 07/21/2020   HCT 45.6 07/21/2020   MCV 88.5 07/21/2020   PLT 318 07/21/2020    Recent Labs  Lab 07/21/20 0821  NA 137  K 3.8  CL 100  CO2 27  BUN 15  CREATININE 1.28*  CALCIUM 8.9  PROT 7.4  BILITOT 0.6  ALKPHOS 99  ALT 37  AST 25  GLUCOSE 132*   Lab Results  Component Value Date   CHOL 221 (H) 07/09/2020   HDL 38 (L) 07/09/2020   LDLCALC 149 (H) 07/09/2020   TRIG 188 (H) 07/09/2020   No results found for: DDIMER  Radiology/Studies:  DG Chest 2 View  Result Date: 07/21/2020 CLINICAL DATA:  Chest pain. EXAM: CHEST - 2 VIEW  COMPARISON:  One-view chest x-ray 09/27/2019 FINDINGS: Heart size is normal. Mild pulmonary vascular congestion is present. No focal airspace opacities are present. This is likely exaggerated by low lung volumes. Visualized soft tissues and bony thorax are unremarkable. IMPRESSION: Low lung volumes and mild pulmonary vascular congestion. Electronically Signed   By: San Morelle M.D.   On: 07/21/2020 09:22    EKG: Normal sinus rhythm  Weights: Filed Weights   07/21/20 0812  Weight: 126.1 kg     Physical Exam: Blood pressure 110/76, pulse 66, temperature 98.1 F (36.7 C), temperature source Oral, resp. rate 18, height 6\' 2"  (1.88 m), weight 126.1 kg, SpO2 98 %. Body mass index is 35.69 kg/m. General: Well developed, well nourished, in no acute distress. Head eyes ears nose throat: Normocephalic, atraumatic, sclera non-icteric, no xanthomas, nares are without discharge. No apparent thyromegaly and/or mass  Lungs: Normal respiratory effort.  no wheezes, no rales, no rhonchi.  Heart: RRR with normal S1 S2. no murmur gallop, no rub, PMI is normal size and placement, carotid upstroke normal without bruit, jugular venous pressure is normal Abdomen: Soft, non-tender, non-distended with normoactive bowel sounds. No hepatomegaly. No rebound/guarding. No obvious abdominal masses. Abdominal aorta is normal size without bruit Extremities: No edema. no cyanosis, no clubbing, no ulcers  Peripheral : 2+ bilateral upper extremity pulses, 2+ bilateral femoral pulses, 2+ bilateral dorsal pedal pulse Neuro: Alert and oriented. No facial asymmetry. No focal deficit. Moves all extremities spontaneously. Musculoskeletal: Normal muscle tone without kyphosis Psych:  Responds to questions appropriately with a normal affect.    Assessment: 49 year old male with hypertension hyperlipidemia having acute chest discomfort without current evidence of myocardial infarction and/or EKG changes    Plan: 1.   Continued observation of chest discomfort with treatment of nitrates and morphine as necessary for resolution. 2.  Continue aspirin and nitrates and will consider heparin if patient has elevation of troponin 3.  Reinstatement of antihypertensives at this time including beta-blocker for better heart rate control and blood pressure control with the possibility of unstable angina 4.  Further consideration of stress test versus cardiac catheterization depending on above  Signed, Corey Skains M.D. [facility]  Cardiology 07/21/2020, 12:12 PM

## 2020-07-21 NOTE — ED Notes (Signed)
Care assumed of pt at this time. Pt with no c/o chest pain. Does request Flexeril for cramping to Rt LE. Provider sent a secure chat at this time. Pt and wife aware that nurse is awaiting admission orders to be placed. Wife taking pt's wallet home with her at this time.

## 2020-07-21 NOTE — ED Notes (Signed)
Pt back from Xray and c/o nausea. Pt states he got nauseated after holding arms over head.

## 2020-07-21 NOTE — ED Notes (Signed)
Pt's wife to nurses station, states "He's feeling nauseated and weak, come help him". Staff at bedside. Pt states that he was sitting on side of bed eating and began to feel nauseated and weak. VS stable at this time. Pt encouraged to rest in bed laying down, and to notify staff if he doesn't feel better.

## 2020-07-21 NOTE — ED Triage Notes (Signed)
Pt reports that he awakened from sleep at 0400 with left sided chest pain, pt states it feels like pressure, reports that he has never felt this before, pt reports taking omeprazole without relief and states that it usually works for indigestion, pt also reports taking 324 mg asa pta

## 2020-07-21 NOTE — ED Notes (Signed)
Mateo Flow, RN called report.

## 2020-07-21 NOTE — ED Notes (Addendum)
Provider messaged about rectifying med rec at this time per charge RN.

## 2020-07-21 NOTE — ED Notes (Signed)
Pt states increased nausea will inform MD

## 2020-07-21 NOTE — ED Notes (Signed)
Pt sitting up in bed, no c/o at this time. Pt states that he still feels bad, but feels better than he did. Continuing to monitor VS closely.

## 2020-07-21 NOTE — ED Notes (Signed)
Lab contacted to come draw labs at this time.

## 2020-07-21 NOTE — ED Notes (Signed)
Pt taken to Xray at this time.

## 2020-07-22 ENCOUNTER — Encounter: Payer: Self-pay | Admitting: Internal Medicine

## 2020-07-22 DIAGNOSIS — R072 Precordial pain: Secondary | ICD-10-CM

## 2020-07-22 LAB — BASIC METABOLIC PANEL
Anion gap: 9 (ref 5–15)
BUN: 17 mg/dL (ref 6–20)
CO2: 27 mmol/L (ref 22–32)
Calcium: 8.6 mg/dL — ABNORMAL LOW (ref 8.9–10.3)
Chloride: 103 mmol/L (ref 98–111)
Creatinine, Ser: 1.28 mg/dL — ABNORMAL HIGH (ref 0.61–1.24)
GFR, Estimated: >60 mL/min (ref >60)
Glucose, Bld: 120 mg/dL — ABNORMAL HIGH (ref 70–99)
Potassium: 4.1 mmol/L (ref 3.5–5.1)
Sodium: 139 mmol/L (ref 135–145)

## 2020-07-22 LAB — CBC
HCT: 41.9 % (ref 39.0–52.0)
Hemoglobin: 14 g/dL (ref 13.0–17.0)
MCH: 29.5 pg (ref 26.0–34.0)
MCHC: 33.4 g/dL (ref 30.0–36.0)
MCV: 88.4 fL (ref 80.0–100.0)
Platelets: 293 10*3/uL (ref 150–400)
RBC: 4.74 MIL/uL (ref 4.22–5.81)
RDW: 13.3 % (ref 11.5–15.5)
WBC: 8 10*3/uL (ref 4.0–10.5)
nRBC: 0 % (ref 0.0–0.2)

## 2020-07-22 LAB — LIPID PANEL
Cholesterol: 176 mg/dL (ref 0–200)
HDL: 29 mg/dL — ABNORMAL LOW (ref >40)
LDL Cholesterol: 120 mg/dL — ABNORMAL HIGH (ref 0–99)
Total CHOL/HDL Ratio: 6.1 RATIO
Triglycerides: 133 mg/dL (ref <150)
VLDL: 27 mg/dL (ref 0–40)

## 2020-07-22 MED ORDER — ATORVASTATIN CALCIUM 40 MG PO TABS
40.0000 mg | ORAL_TABLET | Freq: Every day | ORAL | 2 refills | Status: DC
Start: 2020-07-23 — End: 2021-11-29

## 2020-07-22 MED ORDER — ASPIRIN 81 MG PO TBEC: 81.0000 mg | DELAYED_RELEASE_TABLET | Freq: Every day | ORAL | 2 refills | Status: AC

## 2020-07-22 MED ORDER — METOPROLOL SUCCINATE ER 25 MG PO TB24: 25.0000 mg | ORAL_TABLET | Freq: Every day | ORAL | 2 refills | Status: AC

## 2020-07-22 NOTE — Plan of Care (Signed)
DISCHARGE NOTE HOME [name] to be discharged home per MD order. Discussed prescriptions and follow up appointments with the patient. Prescriptions given to patient; medication list explained in detail. Patient verbalized understanding.  Skin clean, dry and intact without evidence of skin break down, no evidence of skin tears noted. IV catheter discontinued intact. Site without signs and symptoms of complications. Dressing and pressure applied. Pt denies pain at the site currently. No complaints noted.  Patient free of lines, drains, and wounds.   An After Visit Summary (AVS) was printed and given to the patient. Patient wished to walk out with wife, discharged home via private auto.  Stephan Minister, RN

## 2020-07-22 NOTE — Progress Notes (Signed)
[facility] Cardiology [facility] Encounter Note  Patient: [name] / Admit Date: 07/21/2020 / Date of Encounter: 07/22/2020, 7:34 AM   Subjective: Patient feels much better today.  Full resolution of chest discomfort after morphine yesterday afternoon.  Patient has not had any recurrence with ambulation in and around the room.  EKG continues to show normal sinus rhythm with telemetry showing no evidence of changes in EKG or rhythm.  The patient's troponin has been flat and negative without evidence of acute coronary syndrome.  Patient has been reinstated with medication management for previous risk factor modification for cardiovascular disease risks.  Patient has been informed of methods of treatment options at this time including stress test and/or ambulation as well as echocardiogram for further long-term work-up potentially as an outpatient  Echocardiogram showing normal LV systolic function with no evidence of valvular heart disease and ejection fraction of 55% no wall motion abnormalities consistent with cardiovascular issues  Review of Systems: Positive for: None Negative for: Vision change, hearing change, syncope, dizziness, nausea, vomiting,diarrhea, bloody stool, stomach pain, cough, congestion, diaphoresis, urinary frequency, urinary pain,skin lesions, skin rashes Others previously listed  Objective: Telemetry: Normal sinus rhythm Physical Exam: Blood pressure 101/68, pulse 62, temperature 97.8 F (36.6 C), temperature source Oral, resp. rate 16, height 6\' 2"  (1.88 m), weight 125.6 kg, SpO2 100 %. Body mass index is 35.55 kg/m. General: Well developed, well nourished, in no acute distress. Head: Normocephalic, atraumatic, sclera non-icteric, no xanthomas, nares are without discharge. Neck: No apparent masses Lungs: Normal respirations with no wheezes, no rhonchi, no rales , no crackles   Heart: Regular rate and rhythm, normal S1 S2, no murmur, no rub, no gallop, PMI is  normal size and placement, carotid upstroke normal without bruit, jugular venous pressure normal Abdomen: Soft, non-tender, non-distended with normoactive bowel sounds. No hepatosplenomegaly. Abdominal aorta is normal size without bruit Extremities: No edema, no clubbing, no cyanosis, no ulcers,  Peripheral: 2+ radial, 2+ femoral, 2+ dorsal pedal pulses Neuro: Alert and oriented. Moves all extremities spontaneously. Psych:  Responds to questions appropriately with a normal affect.   Intake/Output Summary (Last 24 hours) at 07/22/2020 0734 Last data filed at 07/22/2020 0448 Gross per 24 hour  Intake --  Output 900 ml  Net -900 ml    Inpatient Medications:  . amphetamine-dextroamphetamine  25 mg Oral Daily  . aspirin  324 mg Oral NOW   Or  . aspirin  300 mg Rectal NOW  . aspirin EC  81 mg Oral Daily  . atorvastatin  40 mg Oral Daily  . enoxaparin (LOVENOX) injection  0.5 mg/kg Subcutaneous Q24H  . fluticasone furoate-vilanterol  1 puff Inhalation Daily  . metoprolol succinate  25 mg Oral Daily  . pantoprazole  40 mg Oral Daily  . traZODone  300 mg Oral QHS   Infusions:   Labs: Recent Labs    07/21/20 0821 07/22/20 0559  NA 137 139  K 3.8 4.1  CL 100 103  CO2 27 27  GLUCOSE 132* 120*  BUN 15 17  CREATININE 1.28* 1.28*  CALCIUM 8.9 8.6*   Recent Labs    07/21/20 0821  AST 25  ALT 37  ALKPHOS 99  BILITOT 0.6  PROT 7.4  ALBUMIN 4.1   Recent Labs    07/21/20 0821 07/22/20 0559  WBC 7.0 8.0  NEUTROABS 4.0  --   HGB 15.2 14.0  HCT 45.6 41.9  MCV 88.5 88.4  PLT 318 293   No results for  input(s): CKTOTAL, CKMB, TROPONINI in the last 72 hours. Invalid input(s): POCBNP No results for input(s): HGBA1C in the last 72 hours.   Weights: Filed Weights   07/21/20 0812 07/21/20 2156 07/22/20 0445  Weight: 126.1 kg 126.6 kg 125.6 kg     Radiology/Studies:  DG Chest 2 View  Result Date: 07/21/2020 CLINICAL DATA:  Chest pain. EXAM: CHEST - 2 VIEW COMPARISON:   One-view chest x-ray 09/27/2019 FINDINGS: Heart size is normal. Mild pulmonary vascular congestion is present. No focal airspace opacities are present. This is likely exaggerated by low lung volumes. Visualized soft tissues and bony thorax are unremarkable. IMPRESSION: Low lung volumes and mild pulmonary vascular congestion. Electronically Signed   By: San Morelle M.D.   On: 07/21/2020 09:22   ECHOCARDIOGRAM COMPLETE  Result Date: 07/21/2020    ECHOCARDIOGRAM REPORT   Patient Name:   [name] Date of Exam: 07/21/2020 Medical Rec #:  [id number]     Height:       74.0 in Accession #:    [id number]    Weight:       278.0 lb Date of Birth:  04/22/1971     BSA:          2.500 m Patient Age:    49 years      BP:           110/76 mmHg Patient Gender: M             HR:           66 bpm. Exam Location:  [facility] Procedure: 2D Echo, Cardiac Doppler and Color Doppler Indications:     Chest Pain 786.50  History:         Patient has no prior history of Echocardiogram examinations.                  Risk Factors:Hypertension.  Sonographer:     Sherrie Sport RDCS (AE) Referring Phys:  [id number] Collier Bullock Diagnosing Phys: Serafina Royals MD  Sonographer Comments: Suboptimal apical window. IMPRESSIONS  1. Left ventricular ejection fraction, by estimation, is 50 to 55%. The left ventricle has low normal function. The left ventricle has no regional wall motion abnormalities. Left ventricular diastolic parameters were normal.  2. Right ventricular systolic function is normal. The right ventricular size is normal.  3. The mitral valve is normal in structure. Trivial mitral valve regurgitation.  4. The aortic valve is normal in structure. Aortic valve regurgitation is trivial. FINDINGS  Left Ventricle: Left ventricular ejection fraction, by estimation, is 50 to 55%. The left ventricle has low normal function. The left ventricle has no regional wall motion abnormalities. The left ventricular internal cavity size was normal in  size. There is no left ventricular hypertrophy. Left ventricular diastolic parameters were normal. Right Ventricle: The right ventricular size is normal. No increase in right ventricular wall thickness. Right ventricular systolic function is normal. Left Atrium: Left atrial size was normal in size. Right Atrium: Right atrial size was normal in size. Pericardium: There is no evidence of pericardial effusion. Mitral Valve: The mitral valve is normal in structure. Trivial mitral valve regurgitation. Tricuspid Valve: The tricuspid valve is normal in structure. Tricuspid valve regurgitation is trivial. Aortic Valve: The aortic valve is normal in structure. Aortic valve regurgitation is trivial. Aortic valve mean gradient measures 3.5 mmHg. Aortic valve peak gradient measures 6.2 mmHg. Aortic valve area, by VTI measures 2.18 cm. Pulmonic Valve: The pulmonic valve was normal in structure. Pulmonic valve  regurgitation is not visualized. Aorta: The aortic root and ascending aorta are structurally normal, with no evidence of dilitation. IAS/Shunts: No atrial level shunt detected by color flow Doppler.  LEFT VENTRICLE PLAX 2D LVIDd:         4.97 cm  Diastology LVIDs:         3.13 cm  LV e' medial:    11.00 cm/s LV PW:         1.26 cm  LV E/e' medial:  7.0 LV IVS:        0.94 cm  LV e' lateral:   10.40 cm/s LVOT diam:     2.00 cm  LV E/e' lateral: 7.4 LV SV:         56 LV SV Index:   22 LVOT Area:     3.14 cm  RIGHT VENTRICLE RV Basal diam:  3.59 cm RV S prime:     11.40 cm/s TAPSE (M-mode): 3.9 cm LEFT ATRIUM              Index       RIGHT ATRIUM           Index LA diam:        3.70 cm  1.48 cm/m  RA Area:     20.40 cm LA Vol (A2C):   102.0 ml 40.80 ml/m RA Volume:   65.50 ml  26.20 ml/m LA Vol (A4C):   45.5 ml  18.20 ml/m LA Biplane Vol: 67.3 ml  26.92 ml/m  AORTIC VALVE                   PULMONIC VALVE AV Area (Vmax):    2.18 cm    PV Vmax:        0.65 m/s AV Area (Vmean):   2.38 cm    PV Peak grad:   1.7 mmHg AV  Area (VTI):     2.18 cm    RVOT Peak grad: 2 mmHg AV Vmax:           124.00 cm/s AV Vmean:          87.350 cm/s AV VTI:            0.257 m AV Peak Grad:      6.2 mmHg AV Mean Grad:      3.5 mmHg LVOT Vmax:         86.20 cm/s LVOT Vmean:        66.200 cm/s LVOT VTI:          0.178 m LVOT/AV VTI ratio: 0.69  AORTA Ao Root diam: 3.30 cm MITRAL VALVE               TRICUSPID VALVE MV Area (PHT): 4.68 cm    TR Peak grad:   22.7 mmHg MV Decel Time: 162 msec    TR Vmax:        238.00 cm/s MV E velocity: 76.70 cm/s MV A velocity: 58.70 cm/s  SHUNTS MV E/A ratio:  1.31        Systemic VTI:  0.18 m                            Systemic Diam: 2.00 cm Serafina Royals MD Electronically signed by Serafina Royals MD Signature Date/Time: 07/21/2020/3:26:01 PM    Final      Assessment and Recommendation  49 y.o. male with hypertension hyperlipidemia and family history of cardiovascular disease with very atypical  chest discomfort relieved by morphine with no EKG changes or elevation of troponin and no current evidence of acute coronary syndrome with full resolution of symptoms at this time 1.  Continue and reinstate medication management for risk factor modification for cardiovascular disease including current dose of atorvastatin and metoprolol 2.  Continue aspirin for further risk reduction cardiovascular event 3.  Begin ambulation and follow-up for improvements of symptoms and okay for discharge to home today if ambulating well.  Patient will follow up with further evaluation and treatment next week  Signed, Serafina Royals M.D. FACC

## 2020-07-22 NOTE — Discharge Summary (Signed)
Physician Discharge Summary  [name] UVO:[id number] DOB: 1971/02/28 DOA: 07/21/2020  PCP: Idelle Crouch, MD  Admit date: 07/21/2020 Discharge date: 07/22/2020  Admitted From: Home Disposition:  Home  Recommendations for Outpatient Follow-up:  1. Follow up with PCP 2. Follow up with Cardiology   Discharge Condition: Stable CODE STATUS: Full code Diet recommendation: Heart healthy  Brief/Interim Summary: From H&P by Dr. Francine Graven: "[name] is a 49 y.o. male with medical history significant for ADHD, hypertension, dyslipidemia and significant family history of cardiovascular disease who presents to the ER for evaluation of chest pain which woke him up from sleep around 5 AM on the day of his admission.  He rated his pain an 8 x 10 in intensity at its worst.  Chest pain was mostly midsternal, nonradiating and associated with diaphoresis, nausea and vomiting.  Patient states that he has never had pain like this in the past.  He did take omeprazole without any relief of his pain.  He did have some relief of his pain with nitroglycerin but chest pain is not completely resolved and he rates it a 4 x 10 in intensity at its worst at this time. He denies having any fever or chills, no cough, no shortness of breath, no dizziness, no lightheadedness, no abdominal pain, no changes in his bowel habits, no urinary symptoms. Labs show sodium 137, potassium 3.8, chloride 100, bicarb 27, BUN 15, creatinine 1.28, calcium 8.9, alkaline phosphatase 99, albumin 4.1, lipase 53, AST 25, ALT 37, total protein 7.4, total bilirubin 0.6, troponin IV, white count 7.0, hemoglobin 15.2, hematocrit 45.6, MCV 88.5, RDW 13.1, platelet count 318, fibrin derivatives 217,  Chest x-ray reviewed by me shows low lung volumes and mild pulmonary vascular congestion. Twelve-lead EKG reviewed by me shows normal sinus rhythm with no acute ST or T wave changes  ED Course: Patient is a 49 year old Caucasian male who presents to  the ER for evaluation of chest pain mostly midsternal which woke him up in the early hours of the morning associated with diaphoresis, nausea and vomiting.  He rated his pain 10 x 10 in intensity at its worst and had some improvement with nitroglycerin.  He currently rates his pain a 4 x 10 in intensity.  He will be referred to observation status for further evaluation."  Interim: Cardiology was consulted.  Patient's EKG and troponin has remained normal.  Echocardiogram revealed normal LV systolic function, no wall motion abnormalities.  Patient was discharged home in stable condition and to follow-up closely with cardiology as an outpatient.   Discharge Diagnoses:  Principal Problem:   Chest pain Active Problems:   Adult ADHD   Hypertension   Discharge Instructions  Discharge Instructions    Call MD for:  difficulty breathing, headache or visual disturbances   Complete by: As directed    Call MD for:  extreme fatigue   Complete by: As directed    Call MD for:  persistant dizziness or light-headedness   Complete by: As directed    Call MD for:  persistant nausea and vomiting   Complete by: As directed    Call MD for:  severe uncontrolled pain   Complete by: As directed    Call MD for:  temperature >100.4   Complete by: As directed    Diet - low sodium heart healthy   Complete by: As directed    Discharge instructions   Complete by: As directed    You were cared for by a  hospitalist during your hospital stay. If you have any questions about your discharge medications or the care you received while you were in the hospital after you are discharged, you can call the unit and ask to speak with the hospitalist on call if the hospitalist that took care of you is not available. Once you are discharged, your primary care physician will handle any further medical issues. Please note that NO REFILLS for any discharge medications will be authorized once you are discharged, as it is imperative  that you return to your primary care physician (or establish a relationship with a primary care physician if you do not have one) for your aftercare needs so that they can reassess your need for medications and monitor your lab values.   Increase activity slowly   Complete by: As directed      Allergies as of 07/22/2020      Reactions   No Known Allergies       Medication List    TAKE these medications   Advair Diskus 250-50 MCG/DOSE Aepb Generic drug: Fluticasone-Salmeterol Inhale 1 puff into the lungs daily as needed.   amphetamine-dextroamphetamine 25 MG 24 hr capsule Commonly known as: ADDERALL XR Take 25 mg by mouth daily as needed.   aspirin 81 MG EC tablet Take 1 tablet (81 mg total) by mouth daily. Swallow whole. Start taking on: July 23, 2020   atorvastatin 40 MG tablet Commonly known as: LIPITOR Take 1 tablet (40 mg total) by mouth daily. Start taking on: July 23, 2020   cyclobenzaprine 10 MG tablet Commonly known as: FLEXERIL Take 1 tablet (10 mg total) by mouth 3 (three) times daily. What changed:   when to take this  reasons to take this   fluticasone 50 MCG/ACT nasal spray Commonly known as: FLONASE Place 1 spray into the nose daily as needed for allergies.   ketorolac 10 MG tablet Commonly known as: TORADOL Take 10 mg by mouth daily as needed.   metoprolol succinate 25 MG 24 hr tablet Commonly known as: TOPROL-XL Take 1 tablet (25 mg total) by mouth daily. Start taking on: July 23, 2020   omeprazole 20 MG capsule Commonly known as: PRILOSEC Take 20 mg by mouth daily as needed.   oxyCODONE-acetaminophen 5-325 MG tablet Commonly known as: Percocet Take 1 tablet by mouth every 8 (eight) hours as needed.   traZODone 150 MG tablet Commonly known as: DESYREL 300 mg at bedtime as needed.       Follow-up Information    Corey Skains, MD Follow up in 1 week(s).   Specialty: Cardiology Contact information: [facility] [place] [address] [phone]        Idelle Crouch, MD Follow up.   Specialty: Internal Medicine Contact information: [place] [address] [phone]              Allergies  Allergen Reactions  . No Known Allergies     Consultations:  Cardiology    Procedures/Studies: DG Chest 2 View  Result Date: 07/21/2020 CLINICAL DATA:  Chest pain. EXAM: CHEST - 2 VIEW COMPARISON:  One-view chest x-ray 09/27/2019 FINDINGS: Heart size is normal. Mild pulmonary vascular congestion is present. No focal airspace opacities are present. This is likely exaggerated by low lung volumes. Visualized soft tissues and bony thorax are unremarkable. IMPRESSION: Low lung volumes and mild pulmonary vascular congestion. Electronically Signed   By: Wynetta Fines.D.  On: 07/21/2020 09:22   ECHOCARDIOGRAM COMPLETE  Result Date: 07/21/2020    ECHOCARDIOGRAM REPORT   Patient Name:   [name] Can[place] Date of Exam: 07/21/2020 Medical Rec #:  [id number]     Height:       74.0 in Accession #:    [id number]    Weight:       278.0 lb Date of Birth:  [birth year]     BSA:          2.500 m Patient Age:    72 years      BP:           110/76 mmHg Patient Gender: M             HR:           66 bpm. Exam Location:  [facility] Procedure: 2D Echo, Cardiac Doppler and Color Doppler Indications:     Chest Pain 786.50  History:         Patient has no prior history of Echocardiogram examinations.                  Risk Factors:Hypertension.  Sonographer:     Sherrie Sport RDCS (AE) Referring Phys:  [id number] Collier Bullock Diagnosing Phys: Serafina Royals MD  Sonographer Comments: Suboptimal apical window. IMPRESSIONS  1. Left ventricular ejection fraction, by estimation, is 50 to 55%. The left ventricle has low normal function. The left ventricle has no regional wall motion abnormalities. Left ventricular diastolic parameters were normal.  2. Right  ventricular systolic function is normal. The right ventricular size is normal.  3. The mitral valve is normal in structure. Trivial mitral valve regurgitation.  4. The aortic valve is normal in structure. Aortic valve regurgitation is trivial. FINDINGS  Left Ventricle: Left ventricular ejection fraction, by estimation, is 50 to 55%. The left ventricle has low normal function. The left ventricle has no regional wall motion abnormalities. The left ventricular internal cavity size was normal in size. There is no left ventricular hypertrophy. Left ventricular diastolic parameters were normal. Right Ventricle: The right ventricular size is normal. No increase in right ventricular wall thickness. Right ventricular systolic function is normal. Left Atrium: Left atrial size was normal in size. Right Atrium: Right atrial size was normal in size. Pericardium: There is no evidence of pericardial effusion. Mitral Valve: The mitral valve is normal in structure. Trivial mitral valve regurgitation. Tricuspid Valve: The tricuspid valve is normal in structure. Tricuspid valve regurgitation is trivial. Aortic Valve: The aortic valve is normal in structure. Aortic valve regurgitation is trivial. Aortic valve mean gradient measures 3.5 mmHg. Aortic valve peak gradient measures 6.2 mmHg. Aortic valve area, by VTI measures 2.18 cm. Pulmonic Valve: The pulmonic valve was normal in structure. Pulmonic valve regurgitation is not visualized. Aorta: The aortic root and ascending aorta are structurally normal, with no evidence of dilitation. IAS/Shunts: No atrial level shunt detected by color flow Doppler.  LEFT VENTRICLE PLAX 2D LVIDd:         4.97 cm  Diastology LVIDs:         3.13 cm  LV e' medial:    11.00 cm/s LV PW:         1.26 cm  LV E/e' medial:  7.0 LV IVS:        0.94 cm  LV e' lateral:   10.40 cm/s LVOT diam:     2.00 cm  LV E/e' lateral: 7.4 LV SV:  56 LV SV Index:   22 LVOT Area:     3.14 cm  RIGHT VENTRICLE RV Basal  diam:  3.59 cm RV S prime:     11.40 cm/s TAPSE (M-mode): 3.9 cm LEFT ATRIUM              Index       RIGHT ATRIUM           Index LA diam:        3.70 cm  1.48 cm/m  RA Area:     20.40 cm LA Vol (A2C):   102.0 ml 40.80 ml/m RA Volume:   65.50 ml  26.20 ml/m LA Vol (A4C):   45.5 ml  18.20 ml/m LA Biplane Vol: 67.3 ml  26.92 ml/m  AORTIC VALVE                   PULMONIC VALVE AV Area (Vmax):    2.18 cm    PV Vmax:        0.65 m/s AV Area (Vmean):   2.38 cm    PV Peak grad:   1.7 mmHg AV Area (VTI):     2.18 cm    RVOT Peak grad: 2 mmHg AV Vmax:           124.00 cm/s AV Vmean:          87.350 cm/s AV VTI:            0.257 m AV Peak Grad:      6.2 mmHg AV Mean Grad:      3.5 mmHg LVOT Vmax:         86.20 cm/s LVOT Vmean:        66.200 cm/s LVOT VTI:          0.178 m LVOT/AV VTI ratio: 0.69  AORTA Ao Root diam: 3.30 cm MITRAL VALVE               TRICUSPID VALVE MV Area (PHT): 4.68 cm    TR Peak grad:   22.7 mmHg MV Decel Time: 162 msec    TR Vmax:        238.00 cm/s MV E velocity: 76.70 cm/s MV A velocity: 58.70 cm/s  SHUNTS MV E/A ratio:  1.31        Systemic VTI:  0.18 m                            Systemic Diam: 2.00 cm Serafina Royals MD Electronically signed by Serafina Royals MD Signature Date/Time: 07/21/2020/3:26:01 PM    Final       Discharge Exam: Vitals:   07/21/20 2156 07/22/20 0445  BP: (!) 103/58 101/68  Pulse: 73 62  Resp:  16  Temp: 97.7 F (36.5 C) 97.8 F (36.6 C)  SpO2: 97% 100%    General: Pt is alert, awake, not in acute distress Cardiovascular: RRR, S1/S2 +, no edema Respiratory: CTA bilaterally, no wheezing, no rhonchi, no respiratory distress, no conversational dyspnea  Abdominal: Soft, NT, ND, bowel sounds + Extremities: no edema, no cyanosis Psych: Normal mood and affect, stable judgement and insight     The results of significant diagnostics from this hospitalization (including imaging, microbiology, ancillary and laboratory) are listed below for reference.      Microbiology: Recent Results (from the past 240 hour(s))  Respiratory Panel by RT PCR (Flu A&B, Covid) - Nasopharyngeal Swab     Status: None  Collection Time: 07/21/20 10:24 AM   Specimen: Nasopharyngeal Swab  Result Value Ref Range Status   SARS Coronavirus 2 by RT PCR NEGATIVE NEGATIVE Final    Comment: (NOTE) SARS-CoV-2 target nucleic acids are NOT DETECTED.  The SARS-CoV-2 RNA is generally detectable in upper respiratoy specimens during the acute phase of infection. The lowest concentration of SARS-CoV-2 viral copies this assay can detect is 131 copies/mL. A negative result does not preclude SARS-Cov-2 infection and should not be used as the sole basis for treatment or other patient management decisions. A negative result may occur with  improper specimen collection/handling, submission of specimen other than nasopharyngeal swab, presence of viral mutation(s) within the areas targeted by this assay, and inadequate number of viral copies (<131 copies/mL). A negative result must be combined with clinical observations, patient history, and epidemiological information. The expected result is Negative.  Fact Sheet for Patients:  PinkCheek.be  Fact Sheet for Healthcare Providers:  GravelBags.it  This test is no t yet approved or cleared by the [place] FDA and  has been authorized for detection and/or diagnosis of SARS-CoV-2 by FDA under an Emergency Use Authorization (EUA). This EUA will remain  in effect (meaning this test can be used) for the duration of the COVID-19 declaration under Section 564(b)(1) of the Act, 21 U.S.C. section 360bbb-3(b)(1), unless the authorization is terminated or revoked sooner.     Influenza A by PCR NEGATIVE NEGATIVE Final   Influenza B by PCR NEGATIVE NEGATIVE Final    Comment: (NOTE) The Xpert Xpress SARS-CoV-2/FLU/RSV assay is intended as an aid in  the diagnosis of  influenza from Nasopharyngeal swab specimens and  should not be used as a sole basis for treatment. Nasal washings and  aspirates are unacceptable for Xpert Xpress SARS-CoV-2/FLU/RSV  testing.  Fact Sheet for Patients: PinkCheek.be  Fact Sheet for Healthcare Providers: GravelBags.it  This test is not yet approved or cleared by the [place] FDA and  has been authorized for detection and/or diagnosis of SARS-CoV-2 by  FDA under an Emergency Use Authorization (EUA). This EUA will remain  in effect (meaning this test can be used) for the duration of the  Covid-19 declaration under Section 564(b)(1) of the Act, 21  U.S.C. section 360bbb-3(b)(1), unless the authorization is  terminated or revoked. Performed at [facility], [address] [place], [place] [address]      Labs: BNP (last 3 results) Recent Labs    03/27/20 0833  BNP 60.4   Basic Metabolic Panel: Recent Labs  Lab 07/21/20 0821 07/22/20 0559  NA 137 139  K 3.8 4.1  CL 100 103  CO2 27 27  GLUCOSE 132* 120*  BUN 15 17  CREATININE 1.28* 1.28*  CALCIUM 8.9 8.6*   Liver Function Tests: Recent Labs  Lab 07/21/20 0821  AST 25  ALT 37  ALKPHOS 99  BILITOT 0.6  PROT 7.4  ALBUMIN 4.1   Recent Labs  Lab 07/21/20 0821  LIPASE 53*   No results for input(s): AMMONIA in the last 168 hours. CBC: Recent Labs  Lab 07/21/20 0821 07/22/20 0559  WBC 7.0 8.0  NEUTROABS 4.0  --   HGB 15.2 14.0  HCT 45.6 41.9  MCV 88.5 88.4  PLT 318 293   Cardiac Enzymes: No results for input(s): CKTOTAL, CKMB, CKMBINDEX, TROPONINI in the last 168 hours. BNP: Invalid input(s): POCBNP CBG: No results for input(s): GLUCAP in the last 168 hours. D-Dimer No results for input(s): DDIMER in the last 72  hours. Hgb A1c No results for input(s): HGBA1C in the last 72 hours. Lipid Profile Recent Labs    07/22/20 0559  CHOL 176  HDL 29*  LDLCALC 120*   TRIG 133  CHOLHDL 6.1   Thyroid function studies No results for input(s): TSH, T4TOTAL, T3FREE, THYROIDAB in the last 72 hours.  Invalid input(s): FREET3 Anemia work up No results for input(s): VITAMINB12, FOLATE, FERRITIN, TIBC, IRON, RETICCTPCT in the last 72 hours. Urinalysis    Component Value Date/Time   COLORURINE STRAW (A) 06/24/2017 1300   APPEARANCEUR Turbid (A) 07/09/2020 1006   LABSPEC 1.010 06/24/2017 1300   LABSPEC 1.008 07/03/2012 0101   PHURINE 6.0 06/24/2017 1300   GLUCOSEU Negative 07/09/2020 1006   GLUCOSEU Negative 07/03/2012 0101   HGBUR MODERATE (A) 06/24/2017 1300   BILIRUBINUR Negative 07/09/2020 1006   BILIRUBINUR Negative 07/03/2012 0101   KETONESUR NEGATIVE 06/24/2017 1300   PROTEINUR Negative 07/09/2020 1006   PROTEINUR NEGATIVE 06/24/2017 1300   UROBILINOGEN 0.2 08/16/2016 1422   UROBILINOGEN 0.2 10/05/2011 1217   NITRITE Negative 07/09/2020 1006   NITRITE NEGATIVE 06/24/2017 1300   LEUKOCYTESUR Negative 07/09/2020 1006   LEUKOCYTESUR Negative 07/03/2012 0101   Sepsis Labs Invalid input(s): PROCALCITONIN,  WBC,  LACTICIDVEN Microbiology Recent Results (from the past 240 hour(s))  Respiratory Panel by RT PCR (Flu A&B, Covid) - Nasopharyngeal Swab     Status: None   Collection Time: 07/21/20 10:24 AM   Specimen: Nasopharyngeal Swab  Result Value Ref Range Status   SARS Coronavirus 2 by RT PCR NEGATIVE NEGATIVE Final    Comment: (NOTE) SARS-CoV-2 target nucleic acids are NOT DETECTED.  The SARS-CoV-2 RNA is generally detectable in upper respiratoy specimens during the acute phase of infection. The lowest concentration of SARS-CoV-2 viral copies this assay can detect is 131 copies/mL. A negative result does not preclude SARS-Cov-2 infection and should not be used as the sole basis for treatment or other patient management decisions. A negative result may occur with  improper specimen collection/handling, submission of specimen other than  nasopharyngeal swab, presence of viral mutation(s) within the areas targeted by this assay, and inadequate number of viral copies (<131 copies/mL). A negative result must be combined with clinical observations, patient history, and epidemiological information. The expected result is Negative.  Fact Sheet for Patients:  PinkCheek.be  Fact Sheet for Healthcare Providers:  GravelBags.it  This test is no t yet approved or cleared by the [place] FDA and  has been authorized for detection and/or diagnosis of SARS-CoV-2 by FDA under an Emergency Use Authorization (EUA). This EUA will remain  in effect (meaning this test can be used) for the duration of the COVID-19 declaration under Section 564(b)(1) of the Act, 21 U.S.C. section 360bbb-3(b)(1), unless the authorization is terminated or revoked sooner.     Influenza A by PCR NEGATIVE NEGATIVE Final   Influenza B by PCR NEGATIVE NEGATIVE Final    Comment: (NOTE) The Xpert Xpress SARS-CoV-2/FLU/RSV assay is intended as an aid in  the diagnosis of influenza from Nasopharyngeal swab specimens and  should not be used as a sole basis for treatment. Nasal washings and  aspirates are unacceptable for Xpert Xpress SARS-CoV-2/FLU/RSV  testing.  Fact Sheet for Patients: PinkCheek.be  Fact Sheet for Healthcare Providers: GravelBags.it  This test is not yet approved or cleared by the [place] FDA and  has been authorized for detection and/or diagnosis of SARS-CoV-2 by  FDA under an Emergency Use Authorization (EUA). This EUA will remain  in effect (meaning this test can be used) for the duration of the  Covid-19 declaration under Section 564(b)(1) of the Act, 21  U.S.C. section 360bbb-3(b)(1), unless the authorization is  terminated or revoked. Performed at [facility], [address] [place], [place]  [address]      Patient was seen and examined on the day of discharge and was found to be in stable condition. Time coordinating discharge: 35 minutes including assessment and coordination of care, as well as examination of the patient.   SIGNED:  Dessa Phi, DO [facility] 07/22/2020, 9:59 AM

## 2021-02-18 ENCOUNTER — Ambulatory Visit: Payer: Self-pay

## 2021-02-22 ENCOUNTER — Other Ambulatory Visit: Payer: Self-pay

## 2021-02-22 ENCOUNTER — Ambulatory Visit (LOCAL_COMMUNITY_HEALTH_CENTER): Payer: Managed Care, Other (non HMO)

## 2021-02-22 DIAGNOSIS — Z23 Encounter for immunization: Secondary | ICD-10-CM

## 2021-02-22 NOTE — Progress Notes (Signed)
Tdap given and tolerated well. Updated [facility] copy given. Josie Saunders, RN

## 2021-11-26 ENCOUNTER — Other Ambulatory Visit: Payer: Self-pay | Admitting: Orthopedic Surgery

## 2021-11-26 NOTE — H&P (Signed)
?  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  He has noted that it has gotten larger and he has stuck a needle in it himself on several different occasions draining a clear jellylike substance.  He also has noted that there has been a change in his nail growth with a ridge.  He reports that it is painful when it gets bumped, but the joint itself is not uncomfortable.  He is here today for further evaluation and treatment.  He is currently out of work on light duty and would like to have it excised. ? ?Past medical history, past surgical history, family history, social history, medications, allergies and review of systems are thoroughly reviewed by me, signed and scanned into [facility] today.  The patient is otherwise healthy. ? ?Exam:  ?Vitals: Refer to EMR. ?Constitutional:  WD, WN, NAD ?HEENT:  NCAT, EOMI ?Neuro/Psych:  Alert & oriented to person, place, and time; appropriate mood & affect ?Lymphatic: No generalized UE edema or lymphadenopathy ?Extremities / MSK:  Both UE are normal with respect to appearance, ranges of motion, joint stability, muscle strength/tone, sensation, & perfusion except as otherwise noted: ? ?The right long finger has what appears to be a mucous cyst on the distal radial aspect of the dorsum of the digit with a ridge nail deformity.  There is no pain with palpation either anterior posterior or radial and laterally of the digit.  There is full digital range of motion.  The cyst does have a scab in the center portion and it does appear to be fluid filled.  NVI. ? ?Labs / Xrays:  ?No new x-rays ? ?Assessment: ?Right long finger mucous cyst ? ?Plan:  ?We discussed these findings.  He wishes to move forward with right long finger mucous cyst excision.  Likely, it won't require transposition flap coverage.  The details of the operative  procedure were discussed with the patient.  Questions were invited and answered.  In addition to the goal of the procedure, the risks of the procedure to include but not limited to bleeding; infection; damage to the nerves or blood vessels that could result in bleeding, numbness, weakness, chronic pain, and the need for additional procedures; stiffness; the need for revision surgery; and anesthetic risks were reviewed.  No specific outcome was guaranteed or implied.  We will have our surgery coordinator contact this patient for scheduling. ?

## 2021-11-29 ENCOUNTER — Encounter (HOSPITAL_BASED_OUTPATIENT_CLINIC_OR_DEPARTMENT_OTHER): Payer: Self-pay | Admitting: Orthopedic Surgery

## 2021-11-29 ENCOUNTER — Other Ambulatory Visit: Payer: Self-pay

## 2021-12-03 NOTE — Progress Notes (Signed)
Sent text reminding pt to come in for lab work and a pre surgery drink.  ?

## 2021-12-06 ENCOUNTER — Encounter (HOSPITAL_BASED_OUTPATIENT_CLINIC_OR_DEPARTMENT_OTHER)
Admission: RE | Disposition: A | Discharge status: Home / Self Care | Payer: Self-pay | Source: Home / Self Care | Attending: Orthopedic Surgery

## 2021-12-06 ENCOUNTER — Other Ambulatory Visit: Payer: Self-pay

## 2021-12-06 ENCOUNTER — Ambulatory Visit (HOSPITAL_BASED_OUTPATIENT_CLINIC_OR_DEPARTMENT_OTHER): Payer: Managed Care, Other (non HMO) | Admitting: Anesthesiology

## 2021-12-06 ENCOUNTER — Encounter (HOSPITAL_BASED_OUTPATIENT_CLINIC_OR_DEPARTMENT_OTHER): Payer: Self-pay | Admitting: Orthopedic Surgery

## 2021-12-06 ENCOUNTER — Ambulatory Visit (HOSPITAL_BASED_OUTPATIENT_CLINIC_OR_DEPARTMENT_OTHER)
Admission: RE | Admit: 2021-12-06 | Discharge: 2021-12-06 | Disposition: A | Discharge status: Home / Self Care | Payer: Managed Care, Other (non HMO) | Attending: Orthopedic Surgery | Admitting: Orthopedic Surgery

## 2021-12-06 DIAGNOSIS — J45909 Unspecified asthma, uncomplicated: Secondary | ICD-10-CM | POA: Diagnosis not present

## 2021-12-06 DIAGNOSIS — I10 Essential (primary) hypertension: Secondary | ICD-10-CM | POA: Diagnosis not present

## 2021-12-06 DIAGNOSIS — Z79899 Other long term (current) drug therapy: Secondary | ICD-10-CM | POA: Diagnosis not present

## 2021-12-06 DIAGNOSIS — G4730 Sleep apnea, unspecified: Secondary | ICD-10-CM | POA: Diagnosis not present

## 2021-12-06 DIAGNOSIS — L728 Other follicular cysts of the skin and subcutaneous tissue: Secondary | ICD-10-CM | POA: Diagnosis present

## 2021-12-06 DIAGNOSIS — R7303 Prediabetes: Secondary | ICD-10-CM | POA: Diagnosis not present

## 2021-12-06 DIAGNOSIS — L729 Follicular cyst of the skin and subcutaneous tissue, unspecified: Secondary | ICD-10-CM

## 2021-12-06 DIAGNOSIS — M1990 Unspecified osteoarthritis, unspecified site: Secondary | ICD-10-CM | POA: Diagnosis not present

## 2021-12-06 HISTORY — PX: GANGLION CYST EXCISION: SHX1691

## 2021-12-06 HISTORY — DX: Unspecified asthma, uncomplicated: J45.909

## 2021-12-06 HISTORY — DX: Prediabetes: R73.03

## 2021-12-06 HISTORY — DX: Hyperlipidemia, unspecified: E78.5

## 2021-12-06 HISTORY — DX: Sleep apnea, unspecified: G47.30

## 2021-12-06 SURGERY — EXCISION, GANGLION CYST, WRIST
Anesthesia: Monitor Anesthesia Care | Site: Hand | Laterality: Right

## 2021-12-06 MED ORDER — LIDOCAINE HCL (PF) 1 % IJ SOLN
INTRAMUSCULAR | Status: DC | PRN
  Administered 2021-12-06: 3 mL

## 2021-12-06 MED ORDER — DEXMEDETOMIDINE (PRECEDEX) IN NS 20 MCG/5ML (4 MCG/ML) IV SYRINGE
PREFILLED_SYRINGE | INTRAVENOUS | Status: DC | PRN
  Administered 2021-12-06: 16 ug via INTRAVENOUS

## 2021-12-06 MED ORDER — CEFAZOLIN SODIUM-DEXTROSE 2-4 GM/100ML-% IV SOLN
2.0000 g | INTRAVENOUS | Status: AC
Start: 2021-12-06 — End: 2021-12-06
  Administered 2021-12-06: 3 g via INTRAVENOUS

## 2021-12-06 MED ORDER — ACETAMINOPHEN 500 MG PO TABS
ORAL_TABLET | ORAL | Status: AC
Start: 2021-12-06 — End: ?
  Filled 2021-12-06: qty 2

## 2021-12-06 MED ORDER — MIDAZOLAM HCL 5 MG/5ML IJ SOLN
INTRAMUSCULAR | Status: DC | PRN
  Administered 2021-12-06: 2 mg via INTRAVENOUS

## 2021-12-06 MED ORDER — BUPIVACAINE-EPINEPHRINE 0.5% -1:200000 IJ SOLN
INTRAMUSCULAR | Status: DC | PRN
  Administered 2021-12-06: 3 mL

## 2021-12-06 MED ORDER — OXYCODONE HCL 5 MG PO TABS: 5.0000 mg | ORAL_TABLET | Freq: Once | ORAL | Status: DC | PRN

## 2021-12-06 MED ORDER — CEFAZOLIN SODIUM-DEXTROSE 2-4 GM/100ML-% IV SOLN
INTRAVENOUS | Status: AC
  Filled 2021-12-06: qty 100

## 2021-12-06 MED ORDER — FENTANYL CITRATE (PF) 100 MCG/2ML IJ SOLN: 25.0000 ug | INTRAMUSCULAR | Status: DC | PRN

## 2021-12-06 MED ORDER — OXYCODONE HCL 5 MG/5ML PO SOLN: 5.0000 mg | Freq: Once | ORAL | Status: DC | PRN

## 2021-12-06 MED ORDER — LACTATED RINGERS IV SOLN: INTRAVENOUS | Status: DC

## 2021-12-06 MED ORDER — ACETAMINOPHEN 500 MG PO TABS
1000.0000 mg | ORAL_TABLET | Freq: Once | ORAL | Status: AC
  Administered 2021-12-06: 1000 mg via ORAL

## 2021-12-06 MED ORDER — LIDOCAINE 2% (20 MG/ML) 5 ML SYRINGE
INTRAMUSCULAR | Status: DC | PRN
  Administered 2021-12-06: 30 mg via INTRAVENOUS

## 2021-12-06 MED ORDER — PROPOFOL 500 MG/50ML IV EMUL
INTRAVENOUS | Status: DC | PRN
  Administered 2021-12-06: 75 ug/kg/min via INTRAVENOUS

## 2021-12-06 MED ORDER — ONDANSETRON HCL 4 MG/2ML IJ SOLN
INTRAMUSCULAR | Status: DC | PRN
  Administered 2021-12-06: 4 mg via INTRAVENOUS

## 2021-12-06 MED ORDER — ACETAMINOPHEN 325 MG PO TABS: 650.0000 mg | ORAL_TABLET | Freq: Four times a day (QID) | ORAL | Status: AC

## 2021-12-06 MED ORDER — FENTANYL CITRATE (PF) 100 MCG/2ML IJ SOLN
INTRAMUSCULAR | Status: DC | PRN
  Administered 2021-12-06 (×2): 50 ug via INTRAVENOUS

## 2021-12-06 MED ORDER — ONDANSETRON HCL 4 MG/2ML IJ SOLN: 4.0000 mg | Freq: Once | INTRAMUSCULAR | Status: DC | PRN

## 2021-12-06 MED ORDER — AMISULPRIDE (ANTIEMETIC) 5 MG/2ML IV SOLN: 10.0000 mg | Freq: Once | INTRAVENOUS | Status: DC | PRN

## 2021-12-06 MED ORDER — IBUPROFEN 200 MG PO TABS
600.0000 mg | ORAL_TABLET | Freq: Four times a day (QID) | ORAL | Status: DC
Start: 2021-12-06 — End: 2022-10-18

## 2021-12-06 MED ORDER — CEFAZOLIN SODIUM-DEXTROSE 1-4 GM/50ML-% IV SOLN
INTRAVENOUS | Status: AC
  Filled 2021-12-06: qty 50

## 2021-12-06 MED ORDER — OXYCODONE HCL 5 MG PO TABS: 5.0000 mg | ORAL_TABLET | Freq: Four times a day (QID) | ORAL | 0 refills | Status: DC | PRN

## 2021-12-06 MED ORDER — MIDAZOLAM HCL 2 MG/2ML IJ SOLN
INTRAMUSCULAR | Status: AC
  Filled 2021-12-06: qty 2

## 2021-12-06 MED ORDER — FENTANYL CITRATE (PF) 100 MCG/2ML IJ SOLN
INTRAMUSCULAR | Status: AC
  Filled 2021-12-06: qty 2

## 2021-12-06 SURGICAL SUPPLY — 43 items
APL PRP STRL LF DISP 70% ISPRP (MISCELLANEOUS) ×1
BAND INSRT 18 STRL LF DISP RB (MISCELLANEOUS)
BAND RUBBER #18 3X1/16 STRL (MISCELLANEOUS) IMPLANT
BLADE MINI RND TIP GREEN BEAV (BLADE) IMPLANT
BLADE SURG 15 STRL LF DISP TIS (BLADE) ×1 IMPLANT
BLADE SURG 15 STRL SS (BLADE) ×2
BNDG CMPR 9X4 STRL LF SNTH (GAUZE/BANDAGES/DRESSINGS)
BNDG COHESIVE 4X5 TAN ST LF (GAUZE/BANDAGES/DRESSINGS) ×2 IMPLANT
BNDG ESMARK 4X9 LF (GAUZE/BANDAGES/DRESSINGS) IMPLANT
BNDG GAUZE ELAST 4 BULKY (GAUZE/BANDAGES/DRESSINGS) ×2 IMPLANT
BNDG PLASTER X FAST 3X3 WHT LF (CAST SUPPLIES) IMPLANT
BNDG PLSTR 9X3 FST ST WHT (CAST SUPPLIES)
CHLORAPREP W/TINT 26 (MISCELLANEOUS) ×2 IMPLANT
CORD BIPOLAR FORCEPS 12FT (ELECTRODE) IMPLANT
COVER BACK TABLE 60X90IN (DRAPES) ×2 IMPLANT
COVER MAYO STAND STRL (DRAPES) ×2 IMPLANT
CUFF TOURN SGL QUICK 18X4 (TOURNIQUET CUFF) IMPLANT
DRAPE EXTREMITY T 121X128X90 (DISPOSABLE) ×2 IMPLANT
DRAPE SURG 17X23 STRL (DRAPES) ×2 IMPLANT
DRSG EMULSION OIL 3X3 NADH (GAUZE/BANDAGES/DRESSINGS) ×2 IMPLANT
GAUZE SPONGE 4X4 12PLY STRL LF (GAUZE/BANDAGES/DRESSINGS) ×2 IMPLANT
GLOVE SRG 8 PF TXTR STRL LF DI (GLOVE) ×1 IMPLANT
GLOVE SURG ENC MOIS LTX SZ7.5 (GLOVE) ×2 IMPLANT
GLOVE SURG LTX SZ6.5 (GLOVE) ×2 IMPLANT
GLOVE SURG UNDER POLY LF SZ7 (GLOVE) ×2 IMPLANT
GLOVE SURG UNDER POLY LF SZ8 (GLOVE) ×2
GOWN STRL REUS W/ TWL LRG LVL3 (GOWN DISPOSABLE) ×2 IMPLANT
GOWN STRL REUS W/TWL LRG LVL3 (GOWN DISPOSABLE) ×4
GOWN STRL REUS W/TWL XL LVL3 (GOWN DISPOSABLE) ×2 IMPLANT
NDL HYPO 25X1 1.5 SAFETY (NEEDLE) IMPLANT
NEEDLE HYPO 25X1 1.5 SAFETY (NEEDLE) IMPLANT
NS IRRIG 1000ML POUR BTL (IV SOLUTION) ×2 IMPLANT
PACK BASIN DAY SURGERY FS (CUSTOM PROCEDURE TRAY) ×2 IMPLANT
PADDING CAST ABS 4INX4YD NS (CAST SUPPLIES)
PADDING CAST ABS COTTON 4X4 ST (CAST SUPPLIES) IMPLANT
STOCKINETTE 6  STRL (DRAPES) ×1
STOCKINETTE 6 STRL (DRAPES) ×1 IMPLANT
SUT VICRYL RAPIDE 4-0 (SUTURE) IMPLANT
SUT VICRYL RAPIDE 4/0 PS 2 (SUTURE) IMPLANT
SYR 10ML LL (SYRINGE) IMPLANT
SYR BULB EAR ULCER 3OZ GRN STR (SYRINGE) IMPLANT
TOWEL GREEN STERILE FF (TOWEL DISPOSABLE) ×2 IMPLANT
UNDERPAD 30X36 HEAVY ABSORB (UNDERPADS AND DIAPERS) ×2 IMPLANT

## 2021-12-06 NOTE — Anesthesia Procedure Notes (Signed)
Procedure Name: [place] ?Date/Time: 12/06/2021 9:20 AM ?Performed by: Signe Colt, CRNA ?Pre-anesthesia Checklist: Patient identified, Emergency Drugs available, Suction available, Patient being monitored and Timeout performed ?Patient Re-evaluated:Patient Re-evaluated prior to induction ?Oxygen Delivery Method: Simple face mask ? ? ? ? ?

## 2021-12-06 NOTE — Interval H&P Note (Signed)
History and Physical Interval Note: ? ?12/06/2021 ?8:12 AM ? ?[name]  has presented today for surgery, with the diagnosis of RIGHT LONG FINGER MUCOUS CYST.  The various methods of treatment have been discussed with the patient and family. After consideration of risks, benefits and other options for treatment, the patient has consented to  Procedure(s): ?RIGHT LONG FINGER MUCOUS CYST EXCISION (Right) as a surgical intervention.  The patient's history has been reviewed, patient examined, no change in status, stable for surgery.  I have reviewed the patient's chart and labs.  Questions were answered to the patient's satisfaction.   ? ? ?Jolyn Nap ? ? ?

## 2021-12-06 NOTE — Transfer of Care (Signed)
Immediate Anesthesia Transfer of Care Note ? ?Patient: [name] ? ?Procedure(s) Performed: RIGHT LONG FINGER MUCOUS CYST EXCISION (Right: Hand) ? ?Patient Location: PACU ? ?Anesthesia Type:MAC ? ?Level of Consciousness: awake, alert , oriented and patient cooperative ? ?Airway & Oxygen Therapy: Patient Spontanous Breathing and Patient connected to face mask oxygen ? ?Post-op Assessment: Report given to RN and Post -op Vital signs reviewed and stable ? ?Post vital signs: Reviewed and stable ? ?Last Vitals:  ?Vitals Value Taken Time  ?BP    ?Temp    ?Pulse 80 12/06/21 0940  ?Resp    ?SpO2 100 % 12/06/21 0940  ?Vitals shown include unvalidated device data. ? ?Last Pain:  ?Vitals:  ? 12/06/21 0826  ?TempSrc: Oral  ?PainSc: 0-No pain  ?   ? ?Patients Stated Pain Goal: 5 (12/06/21 7078) ? ?Complications: No notable events documented. ?

## 2021-12-06 NOTE — Op Note (Signed)
12/06/2021 ? ?8:13 AM ? ?PATIENT:  [name]  51 y.o. male ? ?PRE-OPERATIVE DIAGNOSIS:  R LF mucous cyst ? ?POST-OPERATIVE DIAGNOSIS:  Same ? ?PROCEDURE:  R LF mucous cyst excision ? ?SURGEON: Rayvon Char. Grandville Silos, MD ? ?PHYSICIAN ASSISTANT: Morley Kos, OPA-C ? ?ANESTHESIA:  local and MAC ? ?SPECIMENS:  None ? ?DRAINS:   None ? ?EBL:   less than 10 mL ? ?PREOPERATIVE INDICATIONS:  [name] is a  51 y.o. male with a painful recurrent R LF mucous cyst. ? ?The risks benefits and alternatives were discussed with the patient preoperatively including but not limited to the risks of infection, bleeding, nerve injury, cardiopulmonary complications, the need for revision surgery, among others, and the patient verbalized understanding and consented to proceed. ? ?OPERATIVE IMPLANTS: none ? ?OPERATIVE PROCEDURE:  After receiving prophylactic antibiotics, the patient was escorted to the operative theatre and placed in a supine position.  A surgical ?time-out? was performed during which the planned procedure, proposed operative site, and the correct patient identity were compared to the operative consent and agreement confirmed by the circulating nurse according to current facility policy.  A digital block was performed by me.  Following application of a tourniquet to the operative extremity, the exposed skin was prepped with Chloraprep and draped in the usual sterile fashion.  The limb was exsanguinated with an Esmarch bandage and the tourniquet inflated to approximately 142mHg higher than systolic BP. ? ?The prominence of the cyst was quite distal, right at the base of the exposed nail and slightly radial of midline.  We started by making a T-shaped incision and elevating both proximal and distal flaps to help discern the correct plane of dissection.  As this was done, an effort was made to stay deep on the distal flap to avoid the nail forming matrix.  A freer elevator was placed under the eponychial fold to help  further define those tissues.  As dissection commenced, it became apparent that the likely stalk feeding the cyst was more ulnar than the prominence of it.  Once this was discovered, a house curette was able to be placed through the connection and into the prominence of the cyst itself, confirming such.  The stalk was excised and the area further debrided with a rondure and bipolar electrocautery.  In so doing, 1 more longitudinal incision was made ulnarly so that the distal flap was a 3 sided flap.  Satisfied with the excision and decompression of the cyst, the wound was irrigated and the tourniquet released.  The major limb was reapproximated with 4-0 Vicryl Rapide interrupted suture and a digital dressing was applied, looping around the wrist to help prevent it from sliding distally..[name] He was taken to the recovery room in stable condition. ? ?DISPOSITION: He will be discharged home today with typical instructions, returning in 10 to 15 days. ? ? ? ? ? ? ? ?  ?

## 2021-12-06 NOTE — Anesthesia Preprocedure Evaluation (Signed)
Anesthesia Evaluation  ?Patient identified by MRN, date of birth, ID band ?Patient awake ? ? ? ?Reviewed: ?Allergy & Precautions, NPO status , Patient's Chart, lab work & pertinent test results ? ?Airway ?Mallampati: II ? ?TM Distance: >3 FB ?Neck ROM: Full ? ? ? Dental ?no notable dental hx. ? ?  ?Pulmonary ?asthma , sleep apnea ,  ?  ?Pulmonary exam normal ?breath sounds clear to auscultation ? ? ? ? ? ? Cardiovascular ?hypertension, Pt. on medications and Pt. on home beta blockers ?negative cardio ROS ?Normal cardiovascular exam ?Rhythm:Regular Rate:Normal ? ? ?  ?Neuro/Psych ?negative neurological ROS ? negative psych ROS  ? GI/Hepatic ?negative GI ROS, Neg liver ROS,   ?Endo/Other  ?prediabetes ? Renal/GU ?negative Renal ROS  ?negative genitourinary ?  ?Musculoskeletal ? ?(+) Arthritis , H/o AVN of hip  ? Abdominal ?  ?Peds ?negative pediatric ROS ?(+)  Hematology ?negative hematology ROS ?(+)   ?Anesthesia Other Findings ? ? Reproductive/Obstetrics ?negative OB ROS ? ?  ? ? ? ? ? ? ? ? ? ? ? ? ? ?  ?  ? ? ? ? ? ? ? ? ?Anesthesia Physical ?Anesthesia Plan ? ?ASA: 2 ? ?Anesthesia Plan: MAC  ? ?Post-op Pain Management: Minimal or no pain anticipated  ? ?Induction: Intravenous ? ?PONV Risk Score and Plan: 1 and Propofol infusion, Treatment may vary due to age or medical condition, Midazolam and Ondansetron ? ?Airway Management Planned: Natural Airway and Simple Face Mask ? ?Additional Equipment: None ? ?Intra-op Plan:  ? ?Post-operative Plan:  ? ?Informed Consent: I have reviewed the patients History and Physical, chart, labs and discussed the procedure including the risks, benefits and alternatives for the proposed anesthesia with the patient or authorized representative who has indicated his/her understanding and acceptance.  ? ? ? ?Dental advisory given ? ?Plan Discussed with: Anesthesiologist and CRNA ? ?Anesthesia Plan Comments: (Local by surgeon. MAC with propofol gtt.  Natural airway. Norton Blizzard, MD  ?)  ? ? ? ? ? ? ?Anesthesia Quick Evaluation ? ?

## 2021-12-06 NOTE — Anesthesia Postprocedure Evaluation (Signed)
Anesthesia Post Note ? ?Patient: [name] ? ?Procedure(s) Performed: RIGHT LONG FINGER MUCOUS CYST EXCISION (Right: Hand) ? ?  ? ?Patient location during evaluation: PACU ?Anesthesia Type: MAC ?Level of consciousness: awake and alert ?Pain management: pain level controlled ?Vital Signs Assessment: post-procedure vital signs reviewed and stable ?Respiratory status: spontaneous breathing and respiratory function stable ?Cardiovascular status: stable ?Postop Assessment: no apparent nausea or vomiting ?Anesthetic complications: no ? ? ?No notable events documented. ? ?Last Vitals:  ?Vitals:  ? 12/06/21 1000 12/06/21 1015  ?BP: 117/89 129/87  ?Pulse: 83 82  ?Resp: 15 18  ?Temp:  36.7 ?C  ?SpO2: 98% 98%  ?  ?Last Pain:  ?Vitals:  ? 12/06/21 1015  ?TempSrc:   ?PainSc: 0-No pain  ? ? ?  ?  ?  ?  ?  ?  ? ?Merlinda Frederick ? ? ? ? ?

## 2021-12-06 NOTE — Discharge Instructions (Addendum)
Discharge Instructions ? ? ?You have a dressing with a plaster splint incorporated in it. ?Move your fingers as much as possible, making a full fist and fully opening the fist. ?Elevate your hand to reduce pain & swelling of the digits.  Ice over the operative site may be helpful to reduce pain & swelling.  DO NOT USE HEAT. ?Pain medicine has been prescribed for you.  ?Take Tylenol 650 mg and Ibuprofen 600 mg over the counter every 6 hours. Do not take the Toradol as it is also an NSAID. Take the Oxycodone additionally for severe post operative pain as a rescue medicine. ?Leave the dressing in place until you return to our office.  ?You may shower, but keep the bandage clean & dry.  ?You may drive a car when you are off of prescription pain medications and can safely control your vehicle with both hands. ?Call our office and schedule an appointment for 10-15 days from the date of surgery ? ? ?Please call [phone] during normal business hours or [phone] after hours for any problems. Including the following: ? ?- excessive redness of the incisions ?- drainage for more than 4 days ?- fever of more than 101.5 F ? ?*Please note that pain medications will not be refilled after hours or on weekends.  ? ?Work Status: Patient is currently on light duty due to a shoulder injury.  ? ?No tylenol until after 2:30pm if needed. ?

## 2021-12-08 ENCOUNTER — Encounter (HOSPITAL_BASED_OUTPATIENT_CLINIC_OR_DEPARTMENT_OTHER): Payer: Self-pay | Admitting: Orthopedic Surgery

## 2022-05-02 ENCOUNTER — Emergency Department
Admission: EM | Admit: 2022-05-02 | Discharge: 2022-05-02 | Disposition: A | Discharge status: Home / Self Care | Payer: Managed Care, Other (non HMO) | Attending: Emergency Medicine | Admitting: Emergency Medicine

## 2022-05-02 ENCOUNTER — Encounter: Payer: Self-pay | Admitting: Emergency Medicine

## 2022-05-02 ENCOUNTER — Emergency Department: Payer: Managed Care, Other (non HMO)

## 2022-05-02 DIAGNOSIS — J45909 Unspecified asthma, uncomplicated: Secondary | ICD-10-CM | POA: Insufficient documentation

## 2022-05-02 DIAGNOSIS — I10 Essential (primary) hypertension: Secondary | ICD-10-CM | POA: Diagnosis not present

## 2022-05-02 DIAGNOSIS — Z87442 Personal history of urinary calculi: Secondary | ICD-10-CM | POA: Insufficient documentation

## 2022-05-02 DIAGNOSIS — R4182 Altered mental status, unspecified: Secondary | ICD-10-CM | POA: Diagnosis present

## 2022-05-02 DIAGNOSIS — M25511 Pain in right shoulder: Secondary | ICD-10-CM | POA: Insufficient documentation

## 2022-05-02 LAB — URINE DRUG SCREEN, QUALITATIVE (ARMC ONLY)
Amphetamines, Ur Screen: POSITIVE — AB
Barbiturates, Ur Screen: NOT DETECTED
Benzodiazepine, Ur Scrn: NOT DETECTED
Cannabinoid 50 Ng, Ur ~~LOC~~: NOT DETECTED
Cocaine Metabolite,Ur ~~LOC~~: NOT DETECTED
MDMA (Ecstasy)Ur Screen: NOT DETECTED
Methadone Scn, Ur: NOT DETECTED
Opiate, Ur Screen: NOT DETECTED
Phencyclidine (PCP) Ur S: NOT DETECTED
Tricyclic, Ur Screen: NOT DETECTED

## 2022-05-02 LAB — COMPREHENSIVE METABOLIC PANEL
ALT: 34 U/L (ref 0–44)
AST: 26 U/L (ref 15–41)
Albumin: 4 g/dL (ref 3.5–5.0)
Alkaline Phosphatase: 99 U/L (ref 38–126)
Anion gap: 8 (ref 5–15)
BUN: 14 mg/dL (ref 6–20)
CO2: 26 mmol/L (ref 22–32)
Calcium: 8.9 mg/dL (ref 8.9–10.3)
Chloride: 105 mmol/L (ref 98–111)
Creatinine, Ser: 1.16 mg/dL (ref 0.61–1.24)
GFR, Estimated: >60 mL/min (ref >60)
Glucose, Bld: 119 mg/dL — ABNORMAL HIGH (ref 70–99)
Potassium: 3.5 mmol/L (ref 3.5–5.1)
Sodium: 139 mmol/L (ref 135–145)
Total Bilirubin: 0.7 mg/dL (ref 0.3–1.2)
Total Protein: 7.2 g/dL (ref 6.5–8.1)

## 2022-05-02 LAB — DIFFERENTIAL
Abs Immature Granulocytes: 0.02 10*3/uL (ref 0.00–0.07)
Basophils Absolute: 0 10*3/uL (ref 0.0–0.1)
Basophils Relative: 0 %
Eosinophils Absolute: 0.1 10*3/uL (ref 0.0–0.5)
Eosinophils Relative: 2 %
Immature Granulocytes: 0 %
Lymphocytes Relative: 30 %
Lymphs Abs: 2.8 10*3/uL (ref 0.7–4.0)
Monocytes Absolute: 0.6 10*3/uL (ref 0.1–1.0)
Monocytes Relative: 7 %
Neutro Abs: 5.7 10*3/uL (ref 1.7–7.7)
Neutrophils Relative %: 61 %

## 2022-05-02 LAB — CBC
HCT: 44.5 % (ref 39.0–52.0)
Hemoglobin: 14.7 g/dL (ref 13.0–17.0)
MCH: 29.3 pg (ref 26.0–34.0)
MCHC: 33 g/dL (ref 30.0–36.0)
MCV: 88.8 fL (ref 80.0–100.0)
Platelets: 323 K/uL (ref 150–400)
RBC: 5.01 MIL/uL (ref 4.22–5.81)
RDW: 13.3 % (ref 11.5–15.5)
WBC: 9.3 K/uL (ref 4.0–10.5)
nRBC: 0 % (ref 0.0–0.2)

## 2022-05-02 LAB — CBG MONITORING, ED: Glucose-Capillary: 142 mg/dL — ABNORMAL HIGH (ref 70–99)

## 2022-05-02 LAB — PROTIME-INR
INR: 1 (ref 0.8–1.2)
Prothrombin Time: 13 s (ref 11.4–15.2)

## 2022-05-02 LAB — ETHANOL: Alcohol, Ethyl (B): <10 mg/dL (ref <10)

## 2022-05-02 LAB — APTT: aPTT: 29 seconds (ref 24–36)

## 2022-05-02 MED ORDER — SODIUM CHLORIDE 0.9 % IV BOLUS
1000.0000 mL | Freq: Once | INTRAVENOUS | Status: AC
  Administered 2022-05-02: 1000 mL via INTRAVENOUS

## 2022-05-02 MED ORDER — SODIUM CHLORIDE 0.9 % IV SOLN
6.2500 mg | Freq: Once | INTRAVENOUS | Status: AC
  Administered 2022-05-02: 6.25 mg via INTRAVENOUS
  Filled 2022-05-02: qty 0.25

## 2022-05-02 NOTE — ED Triage Notes (Signed)
Pt arrived via POV with spouse who reports pt last seen at baseline at [place] and then pt found in floor by spouse at 37. Per spouse pt was pale, diaphoretic, weak, lethargic and not able to follow commands as well as uncoordinated with movement and thoughts. Pt arrived to ED weak, lethargic and falling asleep during triage. Pt c/o burning sensation all over body and nausea. No drift noted, no facial droop and moderate to strong grip. While holding arms up, pts arms falling with report of weakness "all over."  Per spouse, pt was given '4mg'$  Zofran PO prior to arrival.

## 2022-05-02 NOTE — Consult Note (Signed)
[facility] Tele[facility]urology Consult Services   Patient Name:   [name], [name] Date of Birth:   29-Aug-1971 Identification Number:   MRN - [id number] Date of Service:   05/02/2022 21:02:12  Diagnosis:       R41.82 - AMS (Altered Mental Status)  Impression:      Patient presents to the hospital for multiple symptoms that started this evening that seems to be improving. He has no clear lateralizing symptoms or deficits to suggest stroke at this point in time, therefore no thrombolytics, NIH stroke scale of 0. Differential for symptoms include toxic metabolic etiology, medication effect; recommend tox metabolic and infectious work-up per the ER. If no continued neurological symptoms and he feels back to his baseline no further acute neurovascular work-up needed. However if he continues to feel altered and not himself despite the above without a clear etiology consider admission for observation and neurology follow-up. Recommendations relayed to ER physician over the phone.  Sign Out:       Discussed with Emergency Department Provider    ------------------------------------------------------------------------------  Advanced Imaging: Advanced Imaging Deferred because:  Non-disabling symptoms as verified by the patient; no cortical signs so not consistent with LVO   Metrics: Last Known Well: 05/02/2022 19:35:00 [facility] Notification Time: 05/02/2022 21:02:12 Arrival Time: 05/02/2022 20:40:00 Stamp Time: 05/02/2022 21:02:12 Initial Response Time: 05/02/2022 21:04:12 Symptoms: Lethargy, gait instability. Initial patient interaction: 05/02/2022 21:07:29 NIHSS Assessment Completed: 05/02/2022 21:12:00 Patient is not a candidate for Thrombolytic. Thrombolytic Medical Decision: 05/02/2022 21:12:00 Patient was not deemed candidate for Thrombolytic because of following reasons: No disabling symptoms. Other Diagnosis suspected.  I personally Reviewed the CT Head and  it Showed no acute abnormalities  Primary Provider Notified of Diagnostic Impression and Management Plan on: 05/02/2022 21:21:31    ------------------------------------------------------------------------------  History of Present Illness: Patient is a 51 year old Male.  Patient was brought by private transportation with symptoms of Lethargy, gait instability. Patient presents to the hospital for lethargy, gait instability, generalized paresthesias. He does have chronic pain secondary to hip issues as well as right upper extremity rotator cuff tear. He is prescribed Flexeril, as well as Percocet. He reports that this evening he took a Percocet at around [place] secondary to right upper extremity pain and then went to bed. At [place] his wife heard a noise and apparently he was on the floor that time and seemed to be unsteady on his feet. Was brought to the hospital by his wife. Patient reports that when he woke up at [place] he felt "really hot". He slid out of bed which is apparently not unusual for him due to his hip replacements and he laid down on the floor because it felt "cool". He currently feels like his whole body is "tingling and feels "on fire". He feels tired. Apparently he was much more lethargic when he came to the ER but this seems to have improved. He had a CT head without contrast which showed no acute findings on my review. Reports that he normally does not take Percocet; last time he took a Percocet was about 2 or 3 months ago although he did not have any problems taking it at that time. His examination appears to be nonfocal, NIH stroke scale of 0. He is able to sit up and ambulate minimally without any overt ataxia, he feels like his gait is essentially at his baseline.   Past Medical History: [place] PMH:  See HPI Above  Medications:  No Anticoagulant use  No Antiplatelet use Reviewed  EMR for current medications  Allergies:  Reviewed  Social History: Drug Use: No  Family  History:  There is no family history of premature cerebrovascular disease pertinent to this consultation  ROS : 14 Points Review of Systems was performed and was negative except mentioned in HPI.  Past Surgical History: There Is No Surgical History Contributory To Today's Visit    Examination: BP(161/102), Pulse(85), Blood Glucose(142) 1A: Level of Consciousness - Alert; keenly responsive + 0 1B: Ask Month and Age - Both Questions Right + 0 1C: Blink Eyes & Squeeze Hands - Performs Both Tasks + 0 2: Test Horizontal Extraocular Movements - Normal + 0 3: Test Visual Fields - No Visual Loss + 0 4: Test Facial Palsy (Use Grimace if Obtunded) - Normal symmetry + 0 5A: Test Left Arm Motor Drift - No Drift for 10 Seconds + 0 5B: Test Right Arm Motor Drift - No Drift for 10 Seconds + 0 6A: Test Left Leg Motor Drift - No Drift for 5 Seconds + 0 6B: Test Right Leg Motor Drift - No Drift for 5 Seconds + 0 7: Test Limb Ataxia (FNF/Heel-Shin) - No Ataxia + 0 8: Test Sensation - Normal; No sensory loss + 0 9: Test Language/Aphasia - Normal; No aphasia + 0 10: Test Dysarthria - Normal + 0 11: Test Extinction/Inattention - No abnormality + 0  NIHSS Score: 0   Pre-Morbid Modified Rankin Scale: 0 Points = No symptoms at all   Patient/Family was informed the Neurology Consult would occur via TeleHealth consult by way of interactive audio and video telecommunications and consented to receiving care in this manner.   Patient is being evaluated for possible acute neurologic impairment and high probability of imminent or life-threatening deterioration. I spent total of 35 minutes providing care to this patient, including time for face to face visit via telemedicine, review of medical records, imaging studies and discussion of findings with providers, the patient and/or family.   Dr Knox Royalty   [facility] For Inpatient follow-up with [facility] physician please call [facility]  [phone]. This is not an outpatient service. Post hospital discharge, please contact hospital directly.

## 2022-05-02 NOTE — Progress Notes (Addendum)
2059-Code stroke activated, pt already had CT completed 2106-Dr. Maryan Rued joined teleneuro cart -Takumi Din Laurance Flatten, Telestroke RN

## 2022-05-02 NOTE — ED Notes (Signed)
Tele-neuro @ the bedside - Dr. Maryan Rued & EDP Starleen Blue)

## 2022-05-02 NOTE — Discharge Instructions (Signed)
Your blood work and CT and EKG were all reassuring today.  If you have any worsening mental status or develop any new symptoms such as visual change difficulty seeing or numbness or weakness in 1 part of your body please return to the emergency department.

## 2022-05-03 NOTE — ED Provider Notes (Signed)
[facility] Provider Note    Event Date/Time   First MD Initiated Contact with Patient 05/02/22 2105     (approximate)   History   Weakness   HPI  [name] is a 51 y.o. male past medical history of prediabetes, ADHD, hip replacement who presents with altered mental status.  Per patient's wife he was normal around 735.  He was having significant right shoulder pain which is a chronic issue for him took a Percocet and then went to lie down.  She then heard him fall and found him on the ground in the bedroom seem to be confused and was having difficulty ambulating quite unsteady.  Refused ambulance transport so she brought him to the ED.  Patient endorses feeling a tingling sensation all over his body denies any focal numbness tingling weakness denies visual changes difficulty speaking.  Says he feels very tired and nauseous.  Thinks he is dehydrated.  Denies chest pain or abdominal pain.  Denies other drug or alcohol use.  Has taken Percocet once before for his pain did not have the similar reaction to it.  Denies other medications taken concomitantly with the Percocet.    Past Medical History:  Diagnosis Date   ADHD    Allergies    Asthma    AVN (avascular necrosis of bone) (HCC)    r femural head   Complication of anesthesia    pt. states that he wakes up during procedures   Hyperlipemia    Hypertension    Pneumonia    Prediabetes    Reactive airway disease    Sleep apnea     Patient Active Problem List   Diagnosis Date Noted   Chest pain 07/21/2020   Hypertension    Personal history of colonic polyps    Rectal polyp    Polyp of transverse colon    Bronchitis- mild  10/11/2018   Cough- post nasal drip  10/11/2018   Acute non-recurrent maxillary sinusitis 10/11/2018   CAP (community acquired pneumonia) 10/02/2017   AVN of femur ([place]) 02/17/2017   Fracture of femoral neck, left ([place]) 02/14/2017   Hyperlipemia, mixed 10/22/2015   Adult ADHD  07/17/2015   Kidney stones 07/17/2015   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 --      Excl. in [place]? --     Most recent vital signs: Vitals:   05/02/22 2230 05/02/22 2317  BP: (!) 153/94   Pulse: 73   Resp: 16   Temp:  98.5 F (36.9 C)  SpO2: 97%      General: Awake, no distress.  CV:  Good peripheral perfusion.  Resp:  Normal effort.  Abd:  No distention.  Neuro:             Awake, Alert, Oriented x 3  Other:  Patient is somewhat sleepy but awakens and is appropriate Aox3, nml speech  PERRL, EOMI, face symmetric, nml tongue movement  5/5 strength in the BL upper and lower extremities  Sensation grossly intact in the BL upper and lower extremities  Finger-nose-finger intact BL Normal gait no ataxia   ED Results / Procedures / Treatments  Labs (all labs ordered are listed, but only abnormal results are displayed) Labs Reviewed  COMPREHENSIVE METABOLIC PANEL - Abnormal; Notable for the following components:      Result Value   Glucose, Bld 119 (*)    All other components within normal limits  URINE DRUG SCREEN, QUALITATIVE ([facility] ONLY) - Abnormal; Notable for the following components:   Amphetamines, Ur Screen POSITIVE (*)    All other components within normal limits  CBG MONITORING, ED - Abnormal; Notable for the following components:   Glucose-Capillary 142 (*)    All other components within normal limits  PROTIME-INR  APTT  CBC  DIFFERENTIAL  ETHANOL  CBG MONITORING,  ED     EKG  EKG interpretation performed by myself: NSR, nml axis, nml intervals, no acute ischemic changes    RADIOLOGY I reviewed and interpreted the CT scan of the brain which does not show any acute intracranial process    PROCEDURES:  Critical Care performed: No  Procedures  The patient is on the cardiac monitor to evaluate for evidence of arrhythmia and/or significant heart rate changes.   MEDICATIONS ORDERED IN ED: Medications  sodium chloride 0.9 % bolus 1,000 mL (0 mLs Intravenous Stopped 05/02/22 2223)  promethazine (PHENERGAN) 6.25 mg in sodium chloride 0.9 % 50 mL IVPB (0 mg Intravenous Stopped 05/02/22 2207)     IMPRESSION / MDM / ASSESSMENT AND PLAN / ED COURSE  I reviewed the triage vital signs and the nursing notes.                              Patient's presentation is most consistent with acute presentation with potential threat to life or bodily function.  Differential diagnosis includes, but is not limited to, CVA, metabolic encephalopathy, intoxication, drug side effect, less likely meningitis/encephalitis  The patient is a 51 year old who presents with acute onset of altered mental status.  Patient was complaining of pain in his right shoulder took Percocet went to bed was found on the floor in his room and then having some depressed mental status and difficulty ambulating since then.  He was seen in triage and a stroke alert was called.  On my assessment patient is somewhat sleepy however he does awaken appropriately he is alert and oriented x3 and his neurologic exam is completely nonfocal including gait which has no ataxia.  He is complaining of some nausea tingling over his entire body and feeling fatigued but no other symptoms of ischemia.  Denies headache and denies chest pain or shortness of breath.  EKG is nonischemic Accu-Chek is within normal limits.  CT of the head is negative for acute abnormality CBC and CMP are reassuring ethanol level is  negative.  Teleneurology evaluated the patient as part of stroke alert also did not feel this was consistent with acute CVA.  Recommended work-up for metabolic and tox etiologies.  I suppose that the Percocet could have caused his depressed mental status although it somewhat unusual  as patient had taken a Percocet in the past and did not have this reaction.  Patient is also on Flexeril, trazodone, Seroquel so there could be a component of polypharmacy.  On reassessment patient is more awake feels improved tolerating p.o.  Given no focal findings on exam improving mental status I think that he is appropriate for outpatient follow-up will discharge.  Patient is to be discharged with his wife who will return the patient to the ED for any worsening mental status.       FINAL CLINICAL IMPRESSION(S) / ED DIAGNOSES   Final diagnoses:  Altered mental status, unspecified altered mental status type     Rx / DC Orders   ED Discharge Orders     None        Note:  This document was prepared using Dragon voice recognition software and may include unintentional dictation errors.   Rada Hay, MD 05/03/22 [phone]

## 2022-07-06 ENCOUNTER — Ambulatory Visit (LOCAL_COMMUNITY_HEALTH_CENTER): Payer: Managed Care, Other (non HMO)

## 2022-07-06 DIAGNOSIS — Z719 Counseling, unspecified: Secondary | ICD-10-CM

## 2022-07-06 DIAGNOSIS — Z23 Encounter for immunization: Secondary | ICD-10-CM | POA: Diagnosis not present

## 2022-10-18 ENCOUNTER — Other Ambulatory Visit: Payer: Self-pay

## 2022-10-18 ENCOUNTER — Encounter: Payer: Self-pay | Admitting: Emergency Medicine

## 2022-10-18 ENCOUNTER — Emergency Department
Admission: EM | Admit: 2022-10-18 | Discharge: 2022-10-18 | Disposition: A | Discharge status: Home / Self Care | Payer: Managed Care, Other (non HMO) | Attending: Emergency Medicine | Admitting: Emergency Medicine

## 2022-10-18 ENCOUNTER — Emergency Department: Payer: Managed Care, Other (non HMO)

## 2022-10-18 DIAGNOSIS — N200 Calculus of kidney: Secondary | ICD-10-CM | POA: Insufficient documentation

## 2022-10-18 DIAGNOSIS — R109 Unspecified abdominal pain: Secondary | ICD-10-CM | POA: Diagnosis present

## 2022-10-18 DIAGNOSIS — J45909 Unspecified asthma, uncomplicated: Secondary | ICD-10-CM | POA: Insufficient documentation

## 2022-10-18 LAB — CBC
HCT: 45.9 % (ref 39.0–52.0)
Hemoglobin: 14.8 g/dL (ref 13.0–17.0)
MCH: 28.6 pg (ref 26.0–34.0)
MCHC: 32.2 g/dL (ref 30.0–36.0)
MCV: 88.8 fL (ref 80.0–100.0)
Platelets: 363 10*3/uL (ref 150–400)
RBC: 5.17 MIL/uL (ref 4.22–5.81)
RDW: 13.4 % (ref 11.5–15.5)
WBC: 7.7 10*3/uL (ref 4.0–10.5)
nRBC: 0 % (ref 0.0–0.2)

## 2022-10-18 LAB — BASIC METABOLIC PANEL
Anion gap: 3 — ABNORMAL LOW (ref 5–15)
BUN: 16 mg/dL (ref 6–20)
CO2: 27 mmol/L (ref 22–32)
Calcium: 8.2 mg/dL — ABNORMAL LOW (ref 8.9–10.3)
Chloride: 105 mmol/L (ref 98–111)
Creatinine, Ser: 1.42 mg/dL — ABNORMAL HIGH (ref 0.61–1.24)
GFR, Estimated: 59 mL/min — ABNORMAL LOW (ref >60)
Glucose, Bld: 207 mg/dL — ABNORMAL HIGH (ref 70–99)
Potassium: 3.6 mmol/L (ref 3.5–5.1)
Sodium: 135 mmol/L (ref 135–145)

## 2022-10-18 LAB — URINALYSIS, W/ REFLEX TO CULTURE (INFECTION SUSPECTED)
Bilirubin Urine: NEGATIVE
Glucose, UA: NEGATIVE mg/dL
Ketones, ur: NEGATIVE mg/dL
Leukocytes,Ua: NEGATIVE
Nitrite: NEGATIVE
Protein, ur: NEGATIVE mg/dL
Specific Gravity, Urine: 1.021 (ref 1.005–1.030)
pH: 5 (ref 5.0–8.0)

## 2022-10-18 MED ORDER — KETOROLAC TROMETHAMINE 10 MG PO TABS: 10.0000 mg | ORAL_TABLET | Freq: Four times a day (QID) | ORAL | 0 refills | Status: AC | PRN

## 2022-10-18 MED ORDER — MORPHINE SULFATE (PF) 4 MG/ML IV SOLN
4.0000 mg | Freq: Once | INTRAVENOUS | Status: AC
  Administered 2022-10-18: 4 mg via INTRAVENOUS
  Filled 2022-10-18: qty 1

## 2022-10-18 MED ORDER — OXYCODONE-ACETAMINOPHEN 10-325 MG PO TABS: 1.0000 | ORAL_TABLET | Freq: Four times a day (QID) | ORAL | 0 refills | Status: AC | PRN

## 2022-10-18 MED ORDER — ONDANSETRON HCL 4 MG/2ML IJ SOLN
4.0000 mg | Freq: Once | INTRAMUSCULAR | Status: AC
  Administered 2022-10-18: 4 mg via INTRAVENOUS
  Filled 2022-10-18: qty 2

## 2022-10-18 MED ORDER — SODIUM CHLORIDE 0.9 % IV BOLUS
1000.0000 mL | Freq: Once | INTRAVENOUS | Status: AC
  Administered 2022-10-18: 1000 mL via INTRAVENOUS

## 2022-10-18 MED ORDER — OXYCODONE-ACETAMINOPHEN 5-325 MG PO TABS
2.0000 | ORAL_TABLET | Freq: Once | ORAL | Status: DC
  Filled 2022-10-18: qty 2

## 2022-10-18 MED ORDER — TAMSULOSIN HCL 0.4 MG PO CAPS: 0.4000 mg | ORAL_CAPSULE | Freq: Every day | ORAL | 0 refills | Status: AC

## 2022-10-18 MED ORDER — KETOROLAC TROMETHAMINE 30 MG/ML IJ SOLN
30.0000 mg | Freq: Once | INTRAMUSCULAR | Status: AC
  Administered 2022-10-18: 30 mg via INTRAVENOUS
  Filled 2022-10-18: qty 1

## 2022-10-18 MED ORDER — ONDANSETRON 4 MG PO TBDP: 4.0000 mg | ORAL_TABLET | Freq: Three times a day (TID) | ORAL | 0 refills | Status: AC | PRN

## 2022-10-18 NOTE — ED Notes (Addendum)
Pt states he passed a kidney stone while urinating. Pt showed photo. Followed up with provider.

## 2022-10-18 NOTE — ED Triage Notes (Signed)
Pt sts that he is having right sided flank pain since this AM. Pt sts that he has a hx of kidney stones. Pt endorses that this is worse than his last if it is one.

## 2022-10-18 NOTE — ED Provider Notes (Signed)
[facility] Provider Note    Event Date/Time   First MD Initiated Contact with Patient 10/18/22 1519     (approximate)   History   Flank Pain   HPI  [name] is a 52 y.o. male with history of kidney stones, asthma, prediabetes and avascular necrosis of bone presents emergency department complaining of right-sided flank pain.  Patient has some vomiting associated with the pain.  No fever or chills.  Does still have an appendix.  Denies chest pain or shortness of breath.  Symptoms started earlier today.      Physical Exam   Triage Vital Signs: ED Triage Vitals  Enc Vitals Group     BP 10/18/22 1430 (!) 173/103     Pulse Rate 10/18/22 1430 70     Resp 10/18/22 1430 16     Temp 10/18/22 1430 (!) 97.5 F (36.4 C)     Temp src --      SpO2 10/18/22 1430 98 %     Weight 10/18/22 1431 248 lb (112.5 kg)     Height 10/18/22 1431 '6\' 2"'$  (1.88 m)     Head Circumference --      Peak Flow --      Pain Score --      Pain Loc --      Pain Edu? --      Excl. in [place]? --     Most recent vital signs: Vitals:   10/18/22 1730 10/18/22 1745  BP:  (!) 150/98  Pulse: 87   Resp:    Temp:    SpO2: 100% 100%     General: Awake, no distress.   CV:  Good peripheral perfusion. regular rate and  rhythm Resp:  Normal effort. Lungs cta Abd:  No distention.  Nontender, bowel sounds normal all 4 quadrants  Other:      ED Results / Procedures / Treatments   Labs (all labs ordered are listed, but only abnormal results are displayed) Labs Reviewed  URINALYSIS, W/ REFLEX TO CULTURE (INFECTION SUSPECTED) - Abnormal; Notable for the following components:      Result Value   Color, Urine YELLOW (*)    APPearance HAZY (*)    Hgb urine dipstick LARGE (*)    Bacteria, UA RARE (*)    All other components within normal limits  BASIC METABOLIC PANEL - Abnormal; Notable for the following components:   Glucose, Bld 207 (*)    Creatinine, Ser 1.42 (*)    Calcium  8.2 (*)    GFR, Estimated 59 (*)    Anion gap 3 (*)    All other components within normal limits  CBC     EKG     RADIOLOGY CT renal stone    PROCEDURES:   Procedures   MEDICATIONS ORDERED IN ED: Medications  oxyCODONE-acetaminophen (PERCOCET/ROXICET) 5-325 MG per tablet 2 tablet (2 tablets Oral Patient Refused/Not Given 10/18/22 1734)  morphine (PF) 4 MG/ML injection 4 mg (4 mg Intravenous Given 10/18/22 1558)  ondansetron (ZOFRAN) injection 4 mg (4 mg Intravenous Given 10/18/22 1558)  sodium chloride 0.9 % bolus 1,000 mL (0 mLs Intravenous Stopped 10/18/22 1700)  ketorolac (TORADOL) 30 MG/ML injection 30 mg (30 mg Intravenous Given 10/18/22 1608)  morphine (PF) 4 MG/ML injection 4 mg (4 mg Intravenous Given 10/18/22 1636)  sodium chloride 0.9 % bolus 1,000 mL (0 mLs Intravenous Stopped 10/18/22 1747)     IMPRESSION / MDM / ASSESSMENT AND PLAN / ED COURSE  I reviewed the triage vital signs and the nursing notes.                              Differential diagnosis includes, but is not limited to, kidney stone, acute appendicitis, acute cholecystitis, bowel obstruction, pyelonephritis  Patient's presentation is most consistent with acute complicated illness / injury requiring diagnostic workup.   Patient most likely has kidney stone to his past history and large amount of hemoglobin and uric acid crystals in the urine  CT renal stone study ordered, instructed nursing staff to give him 1 L normal saline, morphine 4 mg IV and Zofran 4 mg IV.  Once the CT results may be able to give him Toradol depending on the area of the stone.   Toradol 30 mg IV.  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  Patient's symptoms indicate stone is most likely in the bladder that we cannot see  Patient did  pass kidney stone while here in the ED.  He is to follow-up with urology.  Return emergency department worsening.  He is given prescription for Toradol, Flomax, Percocet and Zofran.  He is in agreement treatment plan.  Discharged stable condition.  FINAL CLINICAL IMPRESSION(S) / ED DIAGNOSES   Final diagnoses:  Right flank pain  Kidney stone     Rx / DC Orders   ED Discharge Orders          Ordered    oxyCODONE-acetaminophen (PERCOCET) 10-325 MG tablet  Every 6 hours PRN        10/18/22 1642    ketorolac (TORADOL) 10 MG tablet  Every 6 hours PRN        10/18/22 1642    ondansetron (ZOFRAN-ODT) 4 MG disintegrating tablet  Every 8 hours PRN        10/18/22 1642    tamsulosin (FLOMAX) 0.4 MG CAPS capsule  Daily        10/18/22 1642             Note:  This document was prepared using Dragon voice recognition software and may include unintentional dictation errors.    Versie Starks, PA-C 10/18/22 Ramond Dial, MD 10/18/22 [phone]

## 2022-10-18 NOTE — Discharge Instructions (Signed)
Follow up with urology, please call for an appointment Return to the ER if worsening Take the medication as prescribed Do not take ibuprofen or motrin,etc with toradol

## 2022-10-26 ENCOUNTER — Ambulatory Visit: Payer: PRIVATE HEALTH INSURANCE | Admitting: Urology

## 2024-02-11 ENCOUNTER — Emergency Department
Admission: EM | Admit: 2024-02-11 | Discharge: 2024-02-11 | Disposition: A | Discharge status: Home / Self Care | Attending: Emergency Medicine | Admitting: Emergency Medicine

## 2024-02-11 ENCOUNTER — Emergency Department

## 2024-02-11 ENCOUNTER — Other Ambulatory Visit: Payer: Self-pay

## 2024-02-11 DIAGNOSIS — R109 Unspecified abdominal pain: Secondary | ICD-10-CM | POA: Diagnosis present

## 2024-02-11 DIAGNOSIS — N200 Calculus of kidney: Secondary | ICD-10-CM | POA: Diagnosis not present

## 2024-02-11 DIAGNOSIS — I10 Essential (primary) hypertension: Secondary | ICD-10-CM | POA: Insufficient documentation

## 2024-02-11 LAB — BASIC METABOLIC PANEL WITH GFR
Anion gap: 10 (ref 5–15)
BUN: 14 mg/dL (ref 6–20)
CO2: 24 mmol/L (ref 22–32)
Calcium: 9.5 mg/dL (ref 8.9–10.3)
Chloride: 103 mmol/L (ref 98–111)
Creatinine, Ser: 1.17 mg/dL (ref 0.61–1.24)
GFR, Estimated: >60 mL/min (ref >60)
Glucose, Bld: 142 mg/dL — ABNORMAL HIGH (ref 70–99)
Potassium: 3.5 mmol/L (ref 3.5–5.1)
Sodium: 137 mmol/L (ref 135–145)

## 2024-02-11 LAB — URINALYSIS, ROUTINE W REFLEX MICROSCOPIC
Bacteria, UA: NONE SEEN
Bilirubin Urine: NEGATIVE
Glucose, UA: NEGATIVE mg/dL
Ketones, ur: NEGATIVE mg/dL
Leukocytes,Ua: NEGATIVE
Nitrite: NEGATIVE
Protein, ur: NEGATIVE mg/dL
Specific Gravity, Urine: 1.018 (ref 1.005–1.030)
pH: 5 (ref 5.0–8.0)

## 2024-02-11 LAB — CBC
HCT: 46.9 % (ref 39.0–52.0)
Hemoglobin: 16 g/dL (ref 13.0–17.0)
MCH: 30.1 pg (ref 26.0–34.0)
MCHC: 34.1 g/dL (ref 30.0–36.0)
MCV: 88.3 fL (ref 80.0–100.0)
Platelets: 323 10*3/uL (ref 150–400)
RBC: 5.31 MIL/uL (ref 4.22–5.81)
RDW: 12.8 % (ref 11.5–15.5)
WBC: 11.9 10*3/uL — ABNORMAL HIGH (ref 4.0–10.5)
nRBC: 0 % (ref 0.0–0.2)

## 2024-02-11 MED ORDER — ONDANSETRON HCL 4 MG/2ML IJ SOLN
4.0000 mg | Freq: Once | INTRAMUSCULAR | Status: AC
  Administered 2024-02-11: 4 mg via INTRAVENOUS
  Filled 2024-02-11: qty 2

## 2024-02-11 MED ORDER — SODIUM CHLORIDE 0.9 % IV BOLUS
1000.0000 mL | Freq: Once | INTRAVENOUS | Status: AC
  Administered 2024-02-11: 1000 mL via INTRAVENOUS

## 2024-02-11 MED ORDER — KETOROLAC TROMETHAMINE 30 MG/ML IJ SOLN
15.0000 mg | Freq: Once | INTRAMUSCULAR | Status: AC
  Administered 2024-02-11: 15 mg via INTRAVENOUS
  Filled 2024-02-11: qty 1

## 2024-02-11 MED ORDER — HYDROCODONE-ACETAMINOPHEN 5-325 MG PO TABS
1.0000 | ORAL_TABLET | Freq: Once | ORAL | Status: AC
  Administered 2024-02-11: 1 via ORAL
  Filled 2024-02-11: qty 1

## 2024-02-11 MED ORDER — HYDROMORPHONE HCL 1 MG/ML IJ SOLN
1.0000 mg | Freq: Once | INTRAMUSCULAR | Status: AC
  Administered 2024-02-11: 1 mg via INTRAVENOUS
  Filled 2024-02-11: qty 1

## 2024-02-11 NOTE — ED Provider Notes (Signed)
 [facility] Provider Note    Event Date/Time   First MD Initiated Contact with Patient 02/11/24 1644     (approximate)   History   Flank Pain (RIGHT)   HPI  [name] is a 53 y.o. male history of hypertension, kidney stones, AVN of the hips, ADHD presents emergency department with right sided flank pain.  Patient states typical of his kidney stones.  Took Toradol  at home without any relief.  Patient states it feels a little higher than it normally does.  Symptoms started suddenly 3 hours ago.      Physical Exam   Triage Vital Signs: ED Triage Vitals  Encounter Vitals Group     BP 02/11/24 1648 (!) 160/86     Systolic BP Percentile --      Diastolic BP Percentile --      Pulse Rate 02/11/24 1648 (!) 55     Resp 02/11/24 1648 16     Temp 02/11/24 1648 99 F (37.2 C)     Temp Source 02/11/24 1648 Oral     SpO2 02/11/24 1648 98 %     Weight --      Height 02/11/24 1646 6\' 2"  (1.88 m)     Head Circumference --      Peak Flow --      Pain Score 02/11/24 1646 5     Pain Loc --      Pain Education --      Exclude from Growth Chart --     Most recent vital signs: Vitals:   02/11/24 1648 02/11/24 1848  BP: (!) 160/86 137/74  Pulse: (!) 55 68  Resp: 16 16  Temp: 99 F (37.2 C)   SpO2: 98% 98%     General: Awake, no distress.   CV:  Good peripheral perfusion.  Resp:  Normal effort.  Abd:  No distention.  Nontender anteriorly, positive CVA tenderness Other:      ED Results / Procedures / Treatments   Labs (all labs ordered are listed, but only abnormal results are displayed) Labs Reviewed  BASIC METABOLIC PANEL WITH GFR - Abnormal; Notable for the following components:      Result Value   Glucose, Bld 142 (*)    All other components within normal limits  CBC - Abnormal; Notable for the following components:   WBC 11.9 (*)    All other components within normal limits  URINALYSIS, ROUTINE W REFLEX MICROSCOPIC - Abnormal;  Notable for the following components:   Color, Urine YELLOW (*)    APPearance CLEAR (*)    Hgb urine dipstick SMALL (*)    All other components within normal limits     EKG     RADIOLOGY CT renal stone    PROCEDURES:   Procedures  Critical Care:  no Chief Complaint  Patient presents with   Flank Pain    RIGHT      MEDICATIONS ORDERED IN ED: Medications  sodium chloride  0.9 % bolus 1,000 mL (0 mLs Intravenous Stopped 02/11/24 1844)  ondansetron  (ZOFRAN ) injection 4 mg (4 mg Intravenous Given 02/11/24 1703)  HYDROcodone -acetaminophen  (NORCO/VICODIN) 5-325 MG per tablet 1 tablet (1 tablet Oral Given 02/11/24 1703)  HYDROmorphone  (DILAUDID ) injection 1 mg (1 mg Intravenous Given 02/11/24 1759)  ketorolac  (TORADOL ) 30 MG/ML injection 15 mg (15 mg Intravenous Given 02/11/24 1843)  sodium chloride  0.9 % bolus 1,000 mL (1,000 mLs Intravenous New Bag/Given 02/11/24 1843)     IMPRESSION / MDM /  ASSESSMENT AND PLAN / ED COURSE  I reviewed the triage vital signs and the nursing notes.                              Differential diagnosis includes, but is not limited to, kidney stone, pyelonephritis, acute cholecystitis, appendicitis  Patient's presentation is most consistent with acute illness / injury with system symptoms.   Cardiac monitor no Medications given: Normal saline 1 L IV, Dilaudid  1 mg IV, Zofran  4 mg IV, hydrocodone  p.o.  CT renal stone  CT renal stone study independently reviewed interpreted by me by reviewing images and radiologist report.  2 mm nephrolithiasis.  No ureterolithiasis.  Patient's pain has improved.  Go ahead and give him Toradol  IV while here in the ED.  Still waiting on UA to ensure there is no infection.  Patient will be given a second bag of normal saline   UA is assuring, no sign of infection.  I did explain all test results to the patient.  He does have Toradol  Flomax  at home.  Continue to use these as needed.  Follow-up with his regular  doctor as needed.  Return here if worsening.  He is in agreement treatment plan.  Discharged stable condition.     FINAL CLINICAL IMPRESSION(S) / ED DIAGNOSES   Final diagnoses:  Flank pain  Nephrolithiasis     Rx / DC Orders   ED Discharge Orders     None        Note:  This document was prepared using Dragon voice recognition software and may include unintentional dictation errors.    Delsie Figures, PA-C 02/11/24 1943    Lubertha Rush, MD 02/12/24 [phone]

## 2024-02-11 NOTE — ED Notes (Signed)
 Pt returned from CT

## 2024-02-11 NOTE — ED Triage Notes (Signed)
 Pt to ed from home via POV for kidney stone pain on the right side. Pt has HX of same and has already taken Toradol  at home. Pt is caox4, in no acute distress and ambulatory to room 50. Symptoms started about 3 hours ago.

## 2024-02-11 NOTE — ED Notes (Signed)
 Patient transported to CT

## 2024-05-01 ENCOUNTER — Other Ambulatory Visit: Payer: Self-pay | Admitting: Medical Genetics

## 2024-05-03 ENCOUNTER — Other Ambulatory Visit
Admission: RE | Admit: 2024-05-03 | Discharge: 2024-05-03 | Disposition: A | Discharge status: Home / Self Care | Payer: Self-pay | Source: Ambulatory Visit | Attending: Medical Genetics | Admitting: Medical Genetics

## 2024-05-13 LAB — GENECONNECT MOLECULAR SCREEN: Genetic Analysis Overall Interpretation: NEGATIVE
